# Patient Record
Sex: Male | Born: 1950
Health system: Southern US, Community
[De-identification: ages and names within clinical notes are randomized; demographics above are authoritative.]

## PROBLEM LIST (undated history)

## (undated) DIAGNOSIS — Z9289 Personal history of other medical treatment: Secondary | ICD-10-CM

## (undated) DIAGNOSIS — E559 Vitamin D deficiency, unspecified: Secondary | ICD-10-CM

## (undated) DIAGNOSIS — D649 Anemia, unspecified: Secondary | ICD-10-CM

## (undated) DIAGNOSIS — N529 Male erectile dysfunction, unspecified: Secondary | ICD-10-CM

## (undated) DIAGNOSIS — I480 Paroxysmal atrial fibrillation: Secondary | ICD-10-CM

## (undated) DIAGNOSIS — I517 Cardiomegaly: Secondary | ICD-10-CM

## (undated) DIAGNOSIS — I471 Supraventricular tachycardia, unspecified: Secondary | ICD-10-CM

## (undated) DIAGNOSIS — Z87898 Personal history of other specified conditions: Secondary | ICD-10-CM

## (undated) DIAGNOSIS — G8929 Other chronic pain: Secondary | ICD-10-CM

## (undated) DIAGNOSIS — I4891 Unspecified atrial fibrillation: Principal | ICD-10-CM

## (undated) DIAGNOSIS — I10 Essential (primary) hypertension: Secondary | ICD-10-CM

## (undated) DIAGNOSIS — E785 Hyperlipidemia, unspecified: Secondary | ICD-10-CM

## (undated) DIAGNOSIS — I351 Nonrheumatic aortic (valve) insufficiency: Secondary | ICD-10-CM

## (undated) DIAGNOSIS — M542 Cervicalgia: Principal | ICD-10-CM

## (undated) DIAGNOSIS — E119 Type 2 diabetes mellitus without complications: Secondary | ICD-10-CM

## (undated) HISTORY — DX: Other chronic pain: G89.29

## (undated) HISTORY — DX: Nonrheumatic aortic (valve) insufficiency: I35.1

## (undated) HISTORY — DX: Cervicalgia: M54.2

## (undated) HISTORY — PX: TONSILLECTOMY: SUR1361

## (undated) HISTORY — DX: Paroxysmal atrial fibrillation: I48.0

## (undated) HISTORY — PX: CIRCUMCISION: SUR203

## (undated) HISTORY — DX: Cardiomegaly: I51.7

## (undated) HISTORY — DX: Personal history of other medical treatment: Z92.89

## (undated) HISTORY — PX: WISDOM TOOTH EXTRACTION: SHX21

## (undated) HISTORY — DX: Supraventricular tachycardia: I47.1

## (undated) HISTORY — DX: Hyperlipidemia, unspecified: E78.5

## (undated) HISTORY — DX: Vitamin D deficiency, unspecified: E55.9

## (undated) HISTORY — DX: Supraventricular tachycardia, unspecified: I47.10

---

## 2013-04-17 ENCOUNTER — Encounter (HOSPITAL_COMMUNITY): Payer: Self-pay | Admitting: Emergency Medicine

## 2013-04-17 ENCOUNTER — Inpatient Hospital Stay (HOSPITAL_COMMUNITY)
Admission: EM | Admit: 2013-04-17 | Discharge: 2013-04-19 | DRG: 310 | Disposition: A | Discharge status: Home / Self Care | Payer: Managed Care, Other (non HMO) | Attending: Internal Medicine | Admitting: Internal Medicine

## 2013-04-17 DIAGNOSIS — I4891 Unspecified atrial fibrillation: Principal | ICD-10-CM | POA: Diagnosis present

## 2013-04-17 DIAGNOSIS — E119 Type 2 diabetes mellitus without complications: Secondary | ICD-10-CM | POA: Diagnosis present

## 2013-04-17 DIAGNOSIS — I10 Essential (primary) hypertension: Secondary | ICD-10-CM | POA: Diagnosis present

## 2013-04-17 DIAGNOSIS — Z8249 Family history of ischemic heart disease and other diseases of the circulatory system: Secondary | ICD-10-CM

## 2013-04-17 DIAGNOSIS — Z7982 Long term (current) use of aspirin: Secondary | ICD-10-CM

## 2013-04-17 DIAGNOSIS — D649 Anemia, unspecified: Secondary | ICD-10-CM | POA: Diagnosis present

## 2013-04-17 DIAGNOSIS — Z79899 Other long term (current) drug therapy: Secondary | ICD-10-CM

## 2013-04-17 DIAGNOSIS — Z87898 Personal history of other specified conditions: Secondary | ICD-10-CM

## 2013-04-17 HISTORY — DX: Essential (primary) hypertension: I10

## 2013-04-17 HISTORY — DX: Anemia, unspecified: D64.9

## 2013-04-17 HISTORY — DX: Male erectile dysfunction, unspecified: N52.9

## 2013-04-17 HISTORY — DX: Unspecified atrial fibrillation: I48.91

## 2013-04-17 HISTORY — DX: Personal history of other specified conditions: Z87.898

## 2013-04-17 HISTORY — DX: Type 2 diabetes mellitus without complications: E11.9

## 2013-04-17 LAB — CBC WITH DIFFERENTIAL/PLATELET
Basophils Absolute: 0 10*3/uL (ref 0.0–0.1)
Basophils Relative: 0 % (ref 0–1)
Eosinophils Absolute: 0.2 10*3/uL (ref 0.0–0.7)
Eosinophils Relative: 4 % (ref 0–5)
HCT: 38.7 % — ABNORMAL LOW (ref 39.0–52.0)
Hemoglobin: 13.1 g/dL (ref 13.0–17.0)
Lymphocytes Relative: 39 % (ref 12–46)
Lymphs Abs: 2.5 10*3/uL (ref 0.7–4.0)
MCH: 27.6 pg (ref 26.0–34.0)
MCHC: 33.9 g/dL (ref 30.0–36.0)
MCV: 81.6 fL (ref 78.0–100.0)
Monocytes Absolute: 0.6 10*3/uL (ref 0.1–1.0)
Monocytes Relative: 9 % (ref 3–12)
Neutro Abs: 3 10*3/uL (ref 1.7–7.7)
Neutrophils Relative %: 48 % (ref 43–77)
Platelets: 268 10*3/uL (ref 150–400)
RBC: 4.74 MIL/uL (ref 4.22–5.81)
RDW: 15.4 % (ref 11.5–15.5)
WBC: 6.2 10*3/uL (ref 4.0–10.5)

## 2013-04-17 LAB — BASIC METABOLIC PANEL
BUN: 13 mg/dL (ref 6–23)
CO2: 22 mEq/L (ref 19–32)
Calcium: 9.1 mg/dL (ref 8.4–10.5)
Chloride: 107 mEq/L (ref 96–112)
Creatinine, Ser: 0.89 mg/dL (ref 0.50–1.35)
GFR calc Af Amer: >90 mL/min (ref >90)
GFR calc non Af Amer: 88 mL/min — ABNORMAL LOW (ref >90)
Glucose, Bld: 97 mg/dL (ref 70–99)
Potassium: 4.1 mEq/L (ref 3.7–5.3)
Sodium: 142 mEq/L (ref 137–147)

## 2013-04-17 LAB — TROPONIN I: Troponin I: <0.3 ng/mL (ref <0.30)

## 2013-04-17 LAB — MAGNESIUM: Magnesium: 2.1 mg/dL (ref 1.5–2.5)

## 2013-04-17 MED ORDER — HEPARIN BOLUS VIA INFUSION
4000.0000 [IU] | Freq: Once | INTRAVENOUS | Status: AC
  Administered 2013-04-17: 4000 [IU] via INTRAVENOUS
  Filled 2013-04-17: qty 4000

## 2013-04-17 MED ORDER — ACETAMINOPHEN 650 MG RE SUPP: 650.0000 mg | Freq: Four times a day (QID) | RECTAL | Status: DC | PRN

## 2013-04-17 MED ORDER — ONDANSETRON HCL 4 MG/2ML IJ SOLN: 4.0000 mg | Freq: Four times a day (QID) | INTRAMUSCULAR | Status: DC | PRN

## 2013-04-17 MED ORDER — SODIUM CHLORIDE 0.9 % IV SOLN
INTRAVENOUS | Status: DC
  Administered 2013-04-17: 19:00:00 via INTRAVENOUS

## 2013-04-17 MED ORDER — LOSARTAN POTASSIUM 50 MG PO TABS
100.0000 mg | ORAL_TABLET | Freq: Every day | ORAL | Status: DC
  Administered 2013-04-18: 100 mg via ORAL
  Filled 2013-04-17 (×2): qty 2

## 2013-04-17 MED ORDER — ONDANSETRON HCL 4 MG PO TABS: 4.0000 mg | ORAL_TABLET | Freq: Four times a day (QID) | ORAL | Status: DC | PRN

## 2013-04-17 MED ORDER — DILTIAZEM HCL 100 MG IV SOLR
5.0000 mg/h | Freq: Once | INTRAVENOUS | Status: AC
  Administered 2013-04-17: 5 mg/h via INTRAVENOUS

## 2013-04-17 MED ORDER — SODIUM CHLORIDE 0.9 % IJ SOLN
3.0000 mL | Freq: Two times a day (BID) | INTRAMUSCULAR | Status: DC
Start: 2013-04-17 — End: 2013-04-19

## 2013-04-17 MED ORDER — HEPARIN (PORCINE) IN NACL 100-0.45 UNIT/ML-% IJ SOLN
12.0000 [IU]/kg/h | INTRAMUSCULAR | Status: DC
  Administered 2013-04-17: 12 [IU]/kg/h via INTRAVENOUS
  Filled 2013-04-17 (×2): qty 250

## 2013-04-17 MED ORDER — ASPIRIN EC 81 MG PO TBEC
81.0000 mg | DELAYED_RELEASE_TABLET | Freq: Every day | ORAL | Status: DC
  Administered 2013-04-18 (×2): 81 mg via ORAL
  Filled 2013-04-17 (×3): qty 1

## 2013-04-17 MED ORDER — ACETAMINOPHEN 325 MG PO TABS
650.0000 mg | ORAL_TABLET | Freq: Four times a day (QID) | ORAL | Status: DC | PRN
  Administered 2013-04-18: 650 mg via ORAL
  Filled 2013-04-17: qty 2

## 2013-04-17 MED ORDER — SODIUM CHLORIDE 0.9 % IJ SOLN
3.0000 mL | Freq: Two times a day (BID) | INTRAMUSCULAR | Status: DC
Start: 2013-04-17 — End: 2013-04-19
  Administered 2013-04-18: 3 mL via INTRAVENOUS

## 2013-04-17 NOTE — ED Notes (Signed)
Attempted to give report 

## 2013-04-17 NOTE — ED Provider Notes (Signed)
CSN: [id number]     Arrival date & time 04/17/13  1736 History   First MD Initiated Contact with Patient 04/17/13 1737     Chief Complaint  Patient presents with  . Atrial Fibrillation     (Consider location/radiation/quality/duration/timing/severity/associated sxs/prior Treatment) HPI  63 year old male with palpitations and dizziness. Onset earlier today while walking up some steps on his way to go work out. As evaluated by his PCP just prior to arrival. Noted to be in atrial fibrillation with a rate. Between 6380s to 160. Denies any chest pain or shortness of breath. No nausea or diaphoresis. Patient has had episodes of similar symptoms but have been much briefer in duration. No recent medication changes. No fevers or chills. No significant alcohol intake. No significant caffeine intake. No known history of coronary disease that he is aware of. Never been a smoker.  Past Medical History  Diagnosis Date  . Hypertension   . Erectile dysfunction   . Diabetes mellitus without complication     borderline  . Anemia    Past Surgical History  Procedure Laterality Date  . Tonsillectomy    . Circumcision      at 63yrs old   History reviewed. No pertinent family history. History  Substance Use Topics  . Smoking status: Never Smoker   . Smokeless tobacco: Not on file  . Alcohol Use: No    Review of Systems  All systems reviewed and negative, other than as noted in HPI.   Allergies  Penicillins  Home Medications   Current Outpatient Rx  Name  Route  Sig  Dispense  Refill  . aspirin EC 81 MG tablet   Oral   Take 81 mg by mouth at bedtime.         [name]. losartan (COZAAR) 100 MG tablet   Oral   Take 100 mg by mouth daily.         . psyllium (HYDROCIL/METAMUCIL) 95 % PACK   Oral   Take 1 packet by mouth daily.          BP 109/67  Pulse 106  Temp(Src) 98.2 F (36.8 C) (Oral)  Resp 13  SpO2 99% Physical Exam  Nursing note and vitals reviewed. Constitutional: He  appears well-developed and well-nourished. No distress.  HENT:  Head: Normocephalic and atraumatic.  Eyes: Conjunctivae are normal. Right eye exhibits no discharge. Left eye exhibits no discharge.  Neck: Neck supple.  Cardiovascular: Regular rhythm and normal heart sounds.  Exam reveals no gallop and no friction rub.   No murmur heard. Tachycardic. irreg irreg.   Pulmonary/Chest: Effort normal and breath sounds normal. No respiratory distress.  Abdominal: Soft. He exhibits no distension. There is no tenderness.  Musculoskeletal: He exhibits no edema and no tenderness.  Lower extremities symmetric as compared to each other. No calf tenderness. Negative Homan's. No palpable cords.   Neurological: He is alert.  Skin: Skin is warm and dry.  Psychiatric: He has a normal mood and affect. His behavior is normal. Thought content normal.    ED Course  Procedures (including critical care time) Labs Review Labs Reviewed  CBC WITH DIFFERENTIAL - Abnormal; Notable for the following:    HCT 38.7 (*)    All other components within normal limits  BASIC METABOLIC PANEL - Abnormal; Notable for the following:    GFR calc non Af Amer 88 (*)    All other components within normal limits  MAGNESIUM  TROPONIN I  TSH   Imaging Review No  results found.   EKG:  Rhythm: afib with RVR Vent. rate 133 BPM PR interval * ms QRS duration 93 ms QT/QTc 309/460 ms ST segments: normal   MDM   Final diagnoses:  Atrial fibrillation with rapid ventricular response    64 year old male with new onset atrial fibrillation. May potentially have been going on for longer today given report of symptoms of intermittent dizziness and palpitations going on for at least several years. Rate has been controlled with Cardizem drip, but patient remains in atrial fibrillation. Admit.   Raeford Razor, MD 04/18/13 [phone]

## 2013-04-17 NOTE — Progress Notes (Signed)
ANTICOAGULATION CONSULT NOTE - Initial Consult  Pharmacy Consult for heparin Indication: atrial fibrillation  Allergies  Allergen Reactions  . Penicillins     faint    Patient Measurements: Weight: 243 lb (110.224 kg) (per pt report)   Vital Signs: Temp: 98.2 F (36.8 C) (02/25 1746) Temp src: Oral (02/25 1746) BP: 122/75 mmHg (02/25 2115) Pulse Rate: 87 (02/25 2115)  Labs:  Recent Labs  04/17/13 1810  HGB 13.1  HCT 38.7*  PLT 268  CREATININE 0.89  TROPONINI <0.30    CrCl is unknown because there is no height on file for the current visit.   Medical History: Past Medical History  Diagnosis Date  . Hypertension   . Erectile dysfunction   . Diabetes mellitus without complication     borderline  . Anemia     Medications:  ec asa 81, cozaar 100 qd, metamucil qday  Assessment: 63 yo M to start heparin per pharmacy for Afib. His stated weight is 243 pounds.  Creat 0.80, Hg 13.1, plct 268K.    Goal of Therapy:  Heparin level 0.3-0.7 units/ml Monitor platelets by anticoagulation protocol: Yes   Plan:  Give 4000 units bolus x 1 Start heparin infusion at 1300 units/hr Check anti-Xa level in 8 hours and daily while on heparin Continue to monitor H&H and platelets  Herby AbrahamMichelle T. Tuff Clabo, Pharm.D. [phone] 04/17/2013 9:28 PM

## 2013-04-17 NOTE — ED Notes (Signed)
IV pump malfunction, channel error during pt's bolus. Pharmacy consulted and informed. Initiate a 2,000 unit bolus and continue rate of 3113ml/hr.

## 2013-04-17 NOTE — ED Notes (Signed)
MD at bedside. 

## 2013-04-17 NOTE — ED Notes (Signed)
PER EMS- pt was seen at his PCP for dizziness with standing. Pt was noted to have new onset afib with rate at 80-160. Pt denies chest pain/sob. Pt states that at times he does feel his heart racing. Pt received NS with EMS.

## 2013-04-17 NOTE — ED Notes (Signed)
Pt's BP reading 89/60 rate changed back to 5mg /hr, fluids open wide. Pt A&O X4.

## 2013-04-17 NOTE — ED Notes (Signed)
Admitting MD at bedside.

## 2013-04-17 NOTE — H&P (Signed)
[facility] History and Physical  Andrew Perkins [id number] DOB: 1951-01-21 DOA: 04/17/2013  Referring physician: ER physician. PCP: Provider Not In System  Chief Complaint: Palpitations.  HPI: Andrew DerrickMelvin Cimo is a 63 y.o. male history of hypertension presented to the ER because of persistent palpitations since today afternoon. Patient also has subjective feeling of dizziness but did not have any syncope focal deficits. Patient denies any chest pain or shortness of breath. Since patient's palpitation was persistent patient came to the ER an EKG shows atrial fibrillation with RVR. On-call cardiologist was consulted the ER physician and at this time they have requested hospitalist admission and also suggested to start patient on heparin in the event if patient may need cardioversion. Patient states that he has been getting palpitations off and on for last many years. Presently patient has been started on Cardizem infusion after bolus and has been admitted for further workup. Patient denies any nausea vomiting abdominal pain diarrhea fever chills.   Review of Systems: As presented in the history of presenting illness, rest negative.  Past Medical History  Diagnosis Date  . Hypertension   . Erectile dysfunction   . Diabetes mellitus without complication     borderline  . Anemia    Past Surgical History  Procedure Laterality Date  . Tonsillectomy    . Circumcision      at [age]yrs old   Social History:  reports that he has never smoked. He does not have any smokeless tobacco history on file. He reports that he does not drink alcohol or use illicit drugs. Where does patient live home. Can patient participate in ADLs? Yes.  Allergies  Allergen Reactions  . Penicillins     faint    Family History:  Family History  Problem Relation Age of Onset  . Diabetes Mellitus II Brother   . CAD Neg Hx       Prior to Admission medications   Medication Sig Start Date End Date Taking?  Authorizing Provider  aspirin EC 81 MG tablet Take 81 mg by mouth at bedtime.   Yes Historical Provider, MD  losartan (COZAAR) 100 MG tablet Take 100 mg by mouth daily.   Yes Historical Provider, MD  psyllium (HYDROCIL/METAMUCIL) 95 % PACK Take 1 packet by mouth daily.   Yes Historical Provider, MD    Physical Exam: Filed Vitals:   04/17/13 2130 04/17/13 2145 04/17/13 2200 04/17/13 2215  BP: 113/77 107/74 106/69 116/75  Pulse: 77 83 85 74  Temp:      TempSrc:      Resp: 18 14 14 13   Weight:      SpO2: 98% 99% 97% 99%     General:  Well-developed and nourished.  Eyes: Anicteric no pallor.  ENT: No discharge from the ears eyes nose mouth.  Neck: No mass felt.  Cardiovascular: S1-S2 heard tachycardic.  Respiratory: No rhonchi or crepitations.  Abdomen: Soft nontender bowel sounds present. No guarding or rigidity.  Skin: No rash.  Musculoskeletal: No edema.  Psychiatric: Appears normal.  Neurologic: Alert oriented to time place and person. Moves all extremities.  Labs on Admission:  Basic Metabolic Panel:  Recent Labs Lab 04/17/13 1810  NA 142  K 4.1  CL 107  CO2 22  GLUCOSE 97  BUN 13  CREATININE 0.89  CALCIUM 9.1  MG 2.1   Liver Function Tests: No results found for this basename: AST, ALT, ALKPHOS, BILITOT, PROT, ALBUMIN,  in the last 168 hours No results found for this basename:  LIPASE, AMYLASE,  in the last 168 hours No results found for this basename: AMMONIA,  in the last 168 hours CBC:  Recent Labs Lab 04/17/13 1810  WBC 6.2  NEUTROABS 3.0  HGB 13.1  HCT 38.7*  MCV 81.6  PLT 268   Cardiac Enzymes:  Recent Labs Lab 04/17/13 1810  TROPONINI <0.30    BNP (last 3 results) No results found for this basename: PROBNP,  in the last 8760 hours CBG: No results found for this basename: GLUCAP,  in the last 168 hours  Radiological Exams on Admission: No results found.  EKG: Independently reviewed. A. fib with  RVR.  Assessment/Plan Principal Problem:   Atrial fibrillation with RVR Active Problems:   HTN (hypertension)   1. A. fib with RVR - continue Cardizem infusion. I have added metoprolol 25 mg by mouth twice a day following which Cardizem can be weaned off. Check 2-D echo. Patient's CHADS2 score is only one but at this time cardiologist has recommended to start patient on heparin in the event patient may need cardioversion. Based on patient's course and 2-D echo may decide if patient needs to be on long-term anticoagulants are not. Check thyroid function test for hyperthyroidism and also cardiac markers. 2. Hypertension - continue home medications. 3. Patient states he was diagnosed with prediabetes for which I have ordered hemoglobin A1c and if hemoglobin A1c is more than 6.5 and patient definitely will need to be on anticoagulants for A. fib.    Code Status: Full code.  Family Communication: Family at the bedside.  Disposition Plan: Admit to inpatient.    Humza Tallerico N. [facility] Pager [phone].  If 7PM-7AM, please contact night-coverage [email or web address] Password [facility] 04/17/2013, 10:46 PM

## 2013-04-18 DIAGNOSIS — I369 Nonrheumatic tricuspid valve disorder, unspecified: Secondary | ICD-10-CM

## 2013-04-18 LAB — GLUCOSE, CAPILLARY
Glucose-Capillary: 102 mg/dL — ABNORMAL HIGH (ref 70–99)
Glucose-Capillary: 97 mg/dL (ref 70–99)
Glucose-Capillary: 100 mg/dL — ABNORMAL HIGH (ref 70–99)

## 2013-04-18 LAB — CBC WITH DIFFERENTIAL/PLATELET
Basophils Absolute: 0 10*3/uL (ref 0.0–0.1)
Basophils Relative: 0 % (ref 0–1)
Eosinophils Absolute: 0.3 10*3/uL (ref 0.0–0.7)
Eosinophils Relative: 3 % (ref 0–5)
HCT: 36.9 % — ABNORMAL LOW (ref 39.0–52.0)
Hemoglobin: 12.3 g/dL — ABNORMAL LOW (ref 13.0–17.0)
Lymphocytes Relative: 42 % (ref 12–46)
Lymphs Abs: 3.1 10*3/uL (ref 0.7–4.0)
MCH: 27.4 pg (ref 26.0–34.0)
MCHC: 33.3 g/dL (ref 30.0–36.0)
MCV: 82.2 fL (ref 78.0–100.0)
Monocytes Absolute: 0.7 10*3/uL (ref 0.1–1.0)
Monocytes Relative: 10 % (ref 3–12)
Neutro Abs: 3.3 10*3/uL (ref 1.7–7.7)
Neutrophils Relative %: 45 % (ref 43–77)
Platelets: 223 10*3/uL (ref 150–400)
RBC: 4.49 MIL/uL (ref 4.22–5.81)
RDW: 15.5 % (ref 11.5–15.5)
WBC: 7.4 10*3/uL (ref 4.0–10.5)

## 2013-04-18 LAB — COMPREHENSIVE METABOLIC PANEL
ALT: 12 U/L (ref 0–53)
AST: 15 U/L (ref 0–37)
Albumin: 3.1 g/dL — ABNORMAL LOW (ref 3.5–5.2)
Alkaline Phosphatase: 75 U/L (ref 39–117)
BUN: 13 mg/dL (ref 6–23)
CO2: 21 mEq/L (ref 19–32)
Calcium: 8.5 mg/dL (ref 8.4–10.5)
Chloride: 109 mEq/L (ref 96–112)
Creatinine, Ser: 0.91 mg/dL (ref 0.50–1.35)
GFR calc Af Amer: >90 mL/min (ref >90)
GFR calc non Af Amer: 88 mL/min — ABNORMAL LOW (ref >90)
Glucose, Bld: 98 mg/dL (ref 70–99)
Potassium: 3.6 mEq/L — ABNORMAL LOW (ref 3.7–5.3)
Sodium: 142 mEq/L (ref 137–147)
Total Bilirubin: 0.3 mg/dL (ref 0.3–1.2)
Total Protein: 6.5 g/dL (ref 6.0–8.3)

## 2013-04-18 LAB — HEPARIN LEVEL (UNFRACTIONATED): Heparin Unfractionated: 0.39 IU/mL (ref 0.30–0.70)

## 2013-04-18 LAB — T4, FREE: Free T4: 0.88 ng/dL (ref 0.80–1.80)

## 2013-04-18 LAB — TROPONIN I: Troponin I: <0.3 ng/mL (ref <0.30)

## 2013-04-18 LAB — TSH
TSH: 1.199 u[IU]/mL (ref 0.350–4.500)
TSH: 1.987 u[IU]/mL (ref 0.350–4.500)

## 2013-04-18 LAB — HEMOGLOBIN A1C
Hgb A1c MFr Bld: 5.9 % — ABNORMAL HIGH (ref <5.7)
Mean Plasma Glucose: 123 mg/dL — ABNORMAL HIGH (ref <117)

## 2013-04-18 LAB — T3, FREE: T3, Free: 2.8 pg/mL (ref 2.3–4.2)

## 2013-04-18 MED ORDER — HEPARIN (PORCINE) IN NACL 100-0.45 UNIT/ML-% IJ SOLN
1550.0000 [IU]/h | INTRAMUSCULAR | Status: AC
  Administered 2013-04-18 (×2): 1550 [IU]/h via INTRAVENOUS
  Filled 2013-04-18 (×2): qty 250

## 2013-04-18 MED ORDER — DILTIAZEM HCL 30 MG PO TABS
30.0000 mg | ORAL_TABLET | Freq: Four times a day (QID) | ORAL | Status: DC
  Administered 2013-04-18 – 2013-04-19 (×3): 30 mg via ORAL
  Filled 2013-04-18 (×8): qty 1

## 2013-04-18 MED ORDER — APIXABAN 5 MG PO TABS
5.0000 mg | ORAL_TABLET | Freq: Two times a day (BID) | ORAL | Status: DC
  Administered 2013-04-18 – 2013-04-19 (×3): 5 mg via ORAL
  Filled 2013-04-18 (×5): qty 1

## 2013-04-18 MED ORDER — METOPROLOL TARTRATE 25 MG PO TABS
25.0000 mg | ORAL_TABLET | Freq: Two times a day (BID) | ORAL | Status: DC
  Administered 2013-04-18 (×2): 25 mg via ORAL
  Filled 2013-04-18 (×4): qty 1

## 2013-04-18 MED ORDER — DEXTROSE 5 % IV SOLN
5.0000 mg/h | INTRAVENOUS | Status: DC
  Filled 2013-04-18: qty 100

## 2013-04-18 NOTE — Progress Notes (Signed)
Utilization Review Completed.[name] T2/26/2015  

## 2013-04-18 NOTE — Consult Note (Signed)
CARDIOLOGY CONSULT NOTE       Patient ID: Andrew Perkins MRN: [id number] DOB/AGE: January 24, 1951 63 y.o.  Admit date: 04/17/2013 Referring Physician:  Jomarie Longs Primary Physician: Provider Not In System Primary Cardiologist:  New Reason for Consultation: Afib  Principal Problem:   Atrial fibrillation with RVR Active Problems:   HTN (hypertension)   HPI:   63 yo moved to [place] from [place] in August.  Indicates heavy ETOH prior to that but none since.  Over 3 year history of palpitations.  Had a rough time 3 years ago when mother and  Wife died within 2 weeks of each other.  No documented heart disease.  Has a brother with afib in setting of abnormal thyroid.  History of HTN.  No CAD.  Admitted with increasing palpitations and found to be in  Afib.  Currently rate controlled on cardizem drip and on heparin.  Has insurance and has no bleeding issues.  No history of CVA/TIA.  Not having chest pain syncope or dyspnea.  Has noted poor stamina recently Echo is pending   ROS All other systems reviewed and negative except as noted above  Past Medical History  Diagnosis Date  . Hypertension   . Erectile dysfunction   . Diabetes mellitus without complication     borderline  . Anemia     Family History  Problem Relation Age of Onset  . Diabetes Mellitus II Brother   . CAD Neg Hx     History   Social History  . Marital Status: Single    Spouse Name: N/A    Number of Children: N/A  . Years of Education: N/A   Occupational History  . Not on file.   Social History Main Topics  . Smoking status: Never Smoker   . Smokeless tobacco: Not on file  . Alcohol Use: No  . Drug Use: No  . Sexual Activity: Not on file   Other Topics Concern  . Not on file   Social History Narrative  . No narrative on file    Past Surgical History  Procedure Laterality Date  . Tonsillectomy    . Circumcision      at 63yrs old     . aspirin EC  81 mg Oral QHS  . losartan  100 mg Oral Daily  .  metoprolol tartrate  25 mg Oral BID  . sodium chloride  3 mL Intravenous Q12H  . sodium chloride  3 mL Intravenous Q12H   . diltiazem (CARDIZEM) infusion    . heparin 1,550 Units/hr (04/18/13 0302)    Physical Exam: Blood pressure 105/64, pulse 84, temperature 97.7 F (36.5 C), temperature source Oral, resp. rate 18, height 6\' 3"  (1.905 m), weight 245 lb 9.5 oz (111.4 kg), SpO2 97.00%.   Affect appropriate Overweight black male  HEENT: normal Neck supple with no adenopathy JVP normal no bruits no thyromegaly Lungs clear with no wheezing and good diaphragmatic motion Heart:  S1/S2 no murmur, no rub, gallop or click PMI normal Abdomen: benighn, BS positve, no tenderness, no AAA no bruit.  No HSM or HJR Distal pulses intact with no bruits No edema Neuro non-focal Skin warm and dry No muscular weakness   Labs:   Lab Results  Component Value Date   WBC 7.4 04/18/2013   HGB 12.3* 04/18/2013   HCT 36.9* 04/18/2013   MCV 82.2 04/18/2013   PLT 223 04/18/2013    Recent Labs Lab 04/18/13 0146  NA 142  K 3.6*  CL 109  CO2 21  BUN 13  CREATININE 0.91  CALCIUM 8.5  PROT 6.5  BILITOT 0.3  ALKPHOS 75  ALT 12  AST 15  GLUCOSE 98   Lab Results  Component Value Date   TROPONINI <0.30 04/18/2013     Radiology: No results found.  EKG:  Afib no acute ST/T wave changes    ASSESSMENT AND PLAN:  Afib:  May be related to HTN and previous ETOH abuse.  History suggests some chronicity.  Favor starting novel agent like eliquis.  [facility]  If echo shows LV dysfunction can consider TEE/DCC after two days of eliquis.  Transition to PO cardizem and beta blocker for rate control D/C Heparin after 2nd dose of eliquis Pharmacy to handle  Check TSH/T4 HTN:  Well controlled  ETOH:  Indicates being sober since August    Signed: Charlton Hawseter Tirth Cothron 04/18/2013, 8:57 AM

## 2013-04-18 NOTE — Care Management Note (Unsigned)
    Page 1 of 1   04/18/2013     3:13:46 PM   CARE MANAGEMENT NOTE 04/18/2013  Patient:  Andrew Perkins,Andrew Perkins   Account Number:  [id number]  Date Initiated:  04/18/2013  Documentation initiated by:  Itzae Miralles  Subjective/Objective Assessment:   PT ADM ON 04/17/13 WITH AFIB WITH RVR.  PTA, PT INDEPENDENT OF ADLS.     Action/Plan:   CM REFERRAL FOR ELIQUIS.   Anticipated DC Date:  04/21/2013   Anticipated DC Plan:  HOME/SELF CARE      DC Planning Services  CM consult  Medication Assistance      Choice offered to / List presented to:             Status of service:  In process, will continue to follow Medicare Important Message given?   (If response is "NO", the following Medicare IM given date fields will be blank) Date Medicare IM given:   Date Additional Medicare IM given:    Discharge Disposition:    Per UR Regulation:  Reviewed for med. necessity/level of care/duration of stay  If discussed at Long Length of Stay Meetings, dates discussed:    Comments:  04/18/13 Hiba Garry,RN,BSN [phone] PER INSURANCE CARRIER, COPAY WILL BE $63.00 AT ANY RETAIL PHARMACY, AND DOES NOT REQUIRE PRIOR AUTHORIZATION.  PT GIVEN FREE 30 DAY TRIAL CARD AND COPAY ASSISTANC CARD, WHICH WILL LOWER COPAY TO $10/MONTH FOR A 30 DAY SUPPLY. BOTH CARDS HAVE BEEN ACTIVATED.  HE IS APPRECIATIVE OF HELP.

## 2013-04-18 NOTE — Discharge Instructions (Signed)
Information on my medicine - ELIQUIS® (apixaban) °Why was Eliquis® prescribed for you? °Eliquis® was prescribed for you to reduce the risk of a blood clot forming that can cause a stroke if you have a medical condition called atrial fibrillation (a type of irregular heartbeat). ° °What do You need to know about Eliquis® ? °Take your Eliquis® TWICE DAILY - one tablet in the morning and one tablet in the evening with or without food. If you have difficulty swallowing the tablet whole please discuss with your pharmacist how to take the medication safely. ° °Take Eliquis® exactly as prescribed by your doctor and DO NOT stop taking Eliquis® without talking to the doctor who prescribed the medication.  Stopping may increase your risk of developing a stroke.  Refill your prescription before you run out. ° °After discharge, you should have regular check-up appointments with your healthcare provider that is prescribing your Eliquis®.  In the future your dose may need to be changed if your kidney function or weight changes by a significant amount or as you get older. ° °What do you do if you miss a dose? °If you miss a dose, take it as soon as you remember on the same day and resume taking twice daily.  Do not take more than one dose of ELIQUIS at the same time to make up a missed dose. ° °Important Safety Information °A possible side effect of Eliquis® is bleeding. You should call your healthcare provider right away if you experience any of the following: °  Bleeding from an injury or your nose that does not stop. °  Unusual colored urine (red or dark brown) or unusual colored stools (red or black). °  Unusual bruising for unknown reasons. °  A serious fall or if you hit your head (even if there is no bleeding). ° °Some medicines may interact with Eliquis® and might increase your risk of bleeding or clotting while on Eliquis®. To help avoid this, consult your healthcare provider or pharmacist prior to using any new  prescription or non-prescription medications, including herbals, vitamins, non-steroidal anti-inflammatory drugs (NSAIDs) and supplements. ° °This website has more information on Eliquis® (apixaban): www.Eliquis.com. ° °

## 2013-04-18 NOTE — Progress Notes (Signed)
ANTICOAGULATION CONSULT NOTE  Pharmacy Consult for heparin Indication: atrial fibrillation  Allergies  Allergen Reactions  . Penicillins     faint    Patient Measurements: Height: 6\' 3"  (190.5 cm) Weight: 248 lb 10.9 oz (112.8 kg) IBW/kg (Calculated) : 84.5   Vital Signs: Temp: 97.4 F (36.3 C) (02/25 2330) Temp src: Oral (02/25 2330) BP: 129/83 mmHg (02/25 2330) Pulse Rate: 79 (02/25 2330)  Labs:  Recent Labs  04/17/13 1810 04/18/13 0146  HGB 13.1 12.3*  HCT 38.7* 36.9*  PLT 268 223  HEPARINUNFRC  --  0.39  CREATININE 0.89  --   TROPONINI <0.30  --     Estimated Creatinine Clearance: 113.6 ml/min (by C-G formula based on Cr of 0.89).  Assessment: 63 yo Male with Afib for heparin.  Level drawn only 4 hrs after bolus/infusion started, so expect level to continue to decrease out of goal range.  Goal of Therapy:  Heparin level 0.3-0.7 units/ml Monitor platelets by anticoagulation protocol: Yes   Plan:  Increase heparin 1550 units/hr Check heparin level in 6 hours.  Geannie RisenGreg Cassara Nida, PharmD, BCPS  04/18/2013 2:37 AM

## 2013-04-18 NOTE — Progress Notes (Signed)
Patient's HR up to 160s when up to the bathroom, asymptomatic.  HR returns to 90s when at rest.  Paged physician on-call.  Will continue to monitor.  Andrew Perkins, [name]

## 2013-04-18 NOTE — Progress Notes (Signed)
ANTICOAGULATION CONSULT NOTE - Follow Up Consult  Pharmacy Consult for apixaban Indication: atrial fibrillation  Allergies  Allergen Reactions  . Penicillins     faint    Patient Measurements: Height: 6\' 3"  (190.5 cm) Weight: 245 lb 9.5 oz (111.4 kg) IBW/kg (Calculated) : 84.5  Vital Signs: Temp: 97.7 F (36.5 C) (02/26 0515) Temp src: Oral (02/26 0515) BP: 116/73 mmHg (02/26 1024) Pulse Rate: 103 (02/26 1024)  Labs:  Recent Labs  04/17/13 1810 04/18/13 0146  HGB 13.1 12.3*  HCT 38.7* 36.9*  PLT 268 223  HEPARINUNFRC  --  0.39  CREATININE 0.89 0.91  TROPONINI <0.30 <0.30    Estimated Creatinine Clearance: 110.5 ml/min (by C-G formula based on Cr of 0.91).   Medications:  Apixaban 5 mg po bid  Assessment: 63 year old male with new onset AFib with RVR. Transitioning pt from heparin to apixaban.  CBC is stable, no bleeding is noted.  Rate controlled with diltiazem and lopressor.  Goal of Therapy:  Monitor platelets by anticoagulation protocol: Yes   Plan:  Apixaban 5 mg po bid Stop heparin at 2100 tonight CBC q72h  Agapito GamesAlison Elizandro Laura, PharmD, BCPS Clinical Pharmacist Pager: [phone] 04/18/2013 1:20 PM

## 2013-04-18 NOTE — Progress Notes (Addendum)
[facility] PROGRESS NOTE  Andrew Perkins UJW:[id number] DOB: 1951/02/17 DOA: 04/17/2013 PCP: Provider Not In System  Assessment/Plan:  1. A. fib with RVR  - rate improving - start Po cardizem and wean off Cardizem infusion. - continue metoprolol 25 mg BID - FU Check 2-D echo. Patient's CHADS2 score is only one but at this time, await hbaic and ECHO - Appreciate cards input - being transitioned to Apixaban from IV heparin - FU TSh/T4 normal  2. Hypertension -  - cut down losartan to 50mg .  3. Prediabetes: Fu hemoglobin A1c   Code Status: Full code.  Family Communication: Family at the bedside.  Disposition Plan: Admit to inpatient.    Consultants:  cardiology  HPI/Subjective: Feels better, no complaints  Objective: Filed Vitals:   04/18/13 1024  BP: 116/73  Pulse: 103  Temp:   Resp:     Intake/Output Summary (Last 24 hours) at 04/18/13 1235 Last data filed at 04/18/13 0653  Gross per 24 hour  Intake 127.76 ml  Output    325 ml  Net -197.24 ml   Filed Weights   04/17/13 2100 04/17/13 2330 04/18/13 0515  Weight: 110.224 kg (243 lb) 112.8 kg (248 lb 10.9 oz) 111.4 kg (245 lb 9.5 oz)    Exam:   General:  AAOx3,no distress  Cardiovascular: S1S2/irregular rate and rhythm  Respiratory: CTAB  Abdomen: soft, Nt, BS present  Musculoskeletal: no edema c/c   Data Reviewed: Basic Metabolic Panel:  Recent Labs Lab 04/17/13 1810 04/18/13 0146  NA 142 142  K 4.1 3.6*  CL 107 109  CO2 22 21  GLUCOSE 97 98  BUN 13 13  CREATININE 0.89 0.91  CALCIUM 9.1 8.5  MG 2.1  --    Liver Function Tests:  Recent Labs Lab 04/18/13 0146  AST 15  ALT 12  ALKPHOS 75  BILITOT 0.3  PROT 6.5  ALBUMIN 3.1*   No results found for this basename: LIPASE, AMYLASE,  in the last 168 hours No results found for this basename: AMMONIA,  in the last 168 hours CBC:  Recent Labs Lab 04/17/13 1810 04/18/13 0146  WBC 6.2 7.4  NEUTROABS 3.0 3.3  HGB 13.1 12.3*   HCT 38.7* 36.9*  MCV 81.6 82.2  PLT 268 223   Cardiac Enzymes:  Recent Labs Lab 04/17/13 1810 04/18/13 0146  TROPONINI <0.30 <0.30   BNP (last 3 results) No results found for this basename: PROBNP,  in the last 8760 hours CBG:  Recent Labs Lab 04/18/13 0617 04/18/13 1116  GLUCAP 102* 97    No results found for this or any previous visit (from the past 240 hour(s)).   Studies: No results found.  Scheduled Meds: . apixaban  5 mg Oral BID  . aspirin EC  81 mg Oral QHS  . diltiazem  30 mg Oral 4 times per day  . losartan  100 mg Oral Daily  . metoprolol tartrate  25 mg Oral BID  . sodium chloride  3 mL Intravenous Q12H  . sodium chloride  3 mL Intravenous Q12H   Continuous Infusions: . heparin 1,550 Units/hr (04/18/13 1220)    Principal Problem:   Atrial fibrillation with RVR Active Problems:   HTN (hypertension)    Time spent: 30min    [facility] HospitalJOSEPH,[name]  [facility] Pager [phone]. If 7PM-7AM, please contact night-coverage at [email or web address], password [email or web address] 04/18/2013, 12:35 PM  LOS: 1 day

## 2013-04-18 NOTE — Progress Notes (Signed)
  Echocardiogram 2D Echocardiogram has been performed.  Andrew Perkins 04/18/2013, 11:06 AM

## 2013-04-19 ENCOUNTER — Encounter (HOSPITAL_COMMUNITY): Payer: Self-pay | Admitting: Physician Assistant

## 2013-04-19 DIAGNOSIS — F1021 Alcohol dependence, in remission: Secondary | ICD-10-CM

## 2013-04-19 DIAGNOSIS — Z87898 Personal history of other specified conditions: Secondary | ICD-10-CM

## 2013-04-19 LAB — BASIC METABOLIC PANEL
BUN: 11 mg/dL (ref 6–23)
CO2: 22 mEq/L (ref 19–32)
Calcium: 8.7 mg/dL (ref 8.4–10.5)
Chloride: 106 mEq/L (ref 96–112)
Creatinine, Ser: 0.94 mg/dL (ref 0.50–1.35)
GFR calc Af Amer: >90 mL/min (ref >90)
GFR calc non Af Amer: 86 mL/min — ABNORMAL LOW (ref >90)
Glucose, Bld: 96 mg/dL (ref 70–99)
Potassium: 3.8 mEq/L (ref 3.7–5.3)
Sodium: 139 mEq/L (ref 137–147)

## 2013-04-19 LAB — CBC
HCT: 33.3 % — ABNORMAL LOW (ref 39.0–52.0)
Hemoglobin: 11.2 g/dL — ABNORMAL LOW (ref 13.0–17.0)
MCH: 27.6 pg (ref 26.0–34.0)
MCHC: 33.6 g/dL (ref 30.0–36.0)
MCV: 82 fL (ref 78.0–100.0)
Platelets: 184 10*3/uL (ref 150–400)
RBC: 4.06 MIL/uL — ABNORMAL LOW (ref 4.22–5.81)
RDW: 15.6 % — ABNORMAL HIGH (ref 11.5–15.5)
WBC: 4.8 10*3/uL (ref 4.0–10.5)

## 2013-04-19 MED ORDER — LOSARTAN POTASSIUM 50 MG PO TABS
50.0000 mg | ORAL_TABLET | Freq: Every day | ORAL | Status: DC
  Administered 2013-04-19: 50 mg via ORAL
  Filled 2013-04-19: qty 1

## 2013-04-19 MED ORDER — METOPROLOL TARTRATE 50 MG PO TABS: 50.0000 mg | ORAL_TABLET | Freq: Two times a day (BID) | ORAL | Status: DC

## 2013-04-19 MED ORDER — DILTIAZEM HCL ER COATED BEADS 120 MG PO CP24
120.0000 mg | ORAL_CAPSULE | Freq: Every day | ORAL | Status: DC
  Administered 2013-04-19: 120 mg via ORAL
  Filled 2013-04-19: qty 1

## 2013-04-19 MED ORDER — METOPROLOL TARTRATE 50 MG PO TABS
50.0000 mg | ORAL_TABLET | Freq: Two times a day (BID) | ORAL | Status: DC
  Filled 2013-04-19: qty 1

## 2013-04-19 MED ORDER — APIXABAN 5 MG PO TABS: 5.0000 mg | ORAL_TABLET | Freq: Two times a day (BID) | ORAL | Status: DC

## 2013-04-19 NOTE — Progress Notes (Signed)
[facility] PROGRESS NOTE  Andrew Perkins [id number] DOB: Jun 25, 1950 DOA: 04/17/2013 PCP: Provider Not In System  Assessment/Plan:  1. A. fib with RVR  - rate controlled, converted to NSR yesterday - off Cardizem infusion yesterday, change cardizem Q6 to CD. - continue metoprolol 25 mg BID - 2-D echo with normal EF.  -CHADSVas score is one but started on anticoagulation, continue apixaban - Appreciate cards input - TSh/T4 normal - Needs Cards FU, home today if ok with cards  2. Hypertension - continue home medications.  3. Prediabetes: hemoglobin A1c 5.9  Code Status: Full code.  Family Communication: Family at the bedside.  Disposition Plan: Admit to inpatient.    Consultants:  cardiology  HPI/Subjective: Feels better, no complaints  Objective: Filed Vitals:   04/19/13 0325  BP: 113/61  Pulse: 73  Temp: 98.1 F (36.7 C)  Resp: 16    Intake/Output Summary (Last 24 hours) at 04/19/13 0748 Last data filed at 04/19/13 0335  Gross per 24 hour  Intake    480 ml  Output    780 ml  Net   -300 ml   Filed Weights   04/17/13 2330 04/18/13 0515 04/19/13 0651  Weight: 112.8 kg (248 lb 10.9 oz) 111.4 kg (245 lb 9.5 oz) 111.9 kg (246 lb 11.1 oz)    Exam:   General:  AAOx3,no distress  Cardiovascular: S1S2/irregular rate and rhythm  Respiratory: CTAB  Abdomen: soft, Nt, BS present  Musculoskeletal: no edema c/c   Data Reviewed: Basic Metabolic Panel:  Recent Labs Lab 04/17/13 1810 04/18/13 0146 04/19/13 0500  NA 142 142 139  K 4.1 3.6* 3.8  CL 107 109 106  CO2 22 21 22   GLUCOSE 97 98 96  BUN 13 13 11   CREATININE 0.89 0.91 0.94  CALCIUM 9.1 8.5 8.7  MG 2.1  --   --    Liver Function Tests:  Recent Labs Lab 04/18/13 0146  AST 15  ALT 12  ALKPHOS 75  BILITOT 0.3  PROT 6.5  ALBUMIN 3.1*   No results found for this basename: LIPASE, AMYLASE,  in the last 168 hours No results found for this basename: AMMONIA,  in the last 168  hours CBC:  Recent Labs Lab 04/17/13 1810 04/18/13 0146 04/19/13 0500  WBC 6.2 7.4 4.8  NEUTROABS 3.0 3.3  --   HGB 13.1 12.3* 11.2*  HCT 38.7* 36.9* 33.3*  MCV 81.6 82.2 82.0  PLT 268 223 184   Cardiac Enzymes:  Recent Labs Lab 04/17/13 1810 04/18/13 0146  TROPONINI <0.30 <0.30   BNP (last 3 results) No results found for this basename: PROBNP,  in the last 8760 hours CBG:  Recent Labs Lab 04/18/13 0617 04/18/13 1116 04/18/13 1603  GLUCAP 102* 97 100*    No results found for this or any previous visit (from the past 240 hour(s)).   Studies: No results found.  Scheduled Meds: . apixaban  5 mg Oral BID  . aspirin EC  81 mg Oral QHS  . diltiazem  30 mg Oral 4 times per day  . losartan  50 mg Oral Daily  . metoprolol tartrate  25 mg Oral BID  . sodium chloride  3 mL Intravenous Q12H  . sodium chloride  3 mL Intravenous Q12H   Continuous Infusions:    Principal Problem:   Atrial fibrillation with RVR Active Problems:   HTN (hypertension)    Time spent: 30min    [facility] Medical CenterJOSEPH,Andrew Perkins  [facility] Pager [phone]. If 7PM-7AM, please contact  night-coverage at [email or web address], password [facility] 04/19/2013, 7:48 AM  LOS: 2 days

## 2013-04-19 NOTE — Progress Notes (Signed)
Patient: Andrew Perkins / Admit Date: 04/17/2013 / Date of Encounter: 04/19/2013, 8:20 AM   Subjective  Feels well. No complaints.   Objective   Telemetry: converted to NSR yesterday and maintaining  Physical Exam: Blood pressure 113/61, pulse 73, temperature 98.1 F (36.7 C), temperature source Oral, resp. rate 16, height 6\' 3"  (1.905 m), weight 246 lb 11.1 oz (111.9 kg), SpO2 97.00%. General: Well developed, well nourished AAM in no acute distress Head: Normocephalic, atraumatic, sclera non-icteric, no xanthomas, nares are without discharge. Neck: Negative for carotid bruits. JVP not elevated. Lungs: Clear bilaterally to auscultation without wheezes, rales, or rhonchi. Breathing is unlabored. Heart: RRR S1 S2 without murmurs, rubs, or gallops.  Abdomen: Soft, non-tender, non-distended with normoactive bowel sounds. No rebound/guarding. Extremities: No clubbing or cyanosis. No edema. Distal pedal pulses are 2+ and equal bilaterally. Neuro: Alert and oriented X 3. Moves all extremities spontaneously. Psych:  Responds to questions appropriately with a normal affect.   Intake/Output Summary (Last 24 hours) at 04/19/13 0820 Last data filed at 04/19/13 0335  Gross per 24 hour  Intake    480 ml  Output    780 ml  Net   -300 ml    Inpatient Medications:  . apixaban  5 mg Oral BID  . aspirin EC  81 mg Oral QHS  . diltiazem  120 mg Oral Daily  . losartan  50 mg Oral Daily  . metoprolol tartrate  25 mg Oral BID  . sodium chloride  3 mL Intravenous Q12H  . sodium chloride  3 mL Intravenous Q12H   Infusions:    Labs:  Recent Labs  04/17/13 1810 04/18/13 0146 04/19/13 0500  NA 142 142 139  K 4.1 3.6* 3.8  CL 107 109 106  CO2 22 21 22   GLUCOSE 97 98 96  BUN 13 13 11   CREATININE 0.89 0.91 0.94  CALCIUM 9.1 8.5 8.7  MG 2.1  --   --     Recent Labs  04/18/13 0146  AST 15  ALT 12  ALKPHOS 75  BILITOT 0.3  PROT 6.5  ALBUMIN 3.1*    Recent Labs  04/17/13 1810  04/18/13 0146 04/19/13 0500  WBC 6.2 7.4 4.8  NEUTROABS 3.0 3.3  --   HGB 13.1 12.3* 11.2*  HCT 38.7* 36.9* 33.3*  MCV 81.6 82.2 82.0  PLT 268 223 184    Recent Labs  04/17/13 1810 04/18/13 0146  TROPONINI <0.30 <0.30   No components found with this basename: POCBNP,   Recent Labs  04/18/13 0146  HGBA1C 5.9*     Radiology/Studies: No results found.   Assessment and Plan  1. Newly recognized atrial fibrillation (but 3 yr hx of palpitations), spont coverted to NSR 2. HTN, controlled 3. Prior heavy EtOH abuse, abstinent 4. Anemia - denies bleeding, has h/o mild anemia, instructed to f/u PCP   CHADSVASC score officially 1 for HTN, but patient is turning 63 in 1 month and also has borderline diabetes thus anticoagulation has been recommended. CM has seen for apixaban and he has been given the 30-day free card and copay assist card ($10/mo copay). Primary team will need to give him separate 30 day RX to go along with the free card.  I wonder if maybe we can just put him on once daily diltiazem instead of both dilt and metoprolol. He is new to both these meds. (He is now on dilt 120 and metoprolol 25 BID. Has normal LV function.) Will d/w MD. Likely  OK for discharge once this decision is made.  Left message on our scheduling voicemail for 1) followup appt and 2) education/check-in visit in our blood thinner clinic with our pharmacist. Office will call him with these appts.  Signed, Ronie Spiesayna Dunn PA-C

## 2013-04-19 NOTE — Progress Notes (Signed)
IV and tele monitor d/c at this time; pt given d/c instructions and prescriptions; pt verbalized understanding; pt to d/c home with family; pt wanting to eat lunch then will call for ride home.

## 2013-04-19 NOTE — Progress Notes (Signed)
Per cardiology pt ready for d/c home; Dr. Jomarie LongsJoseph paged to make aware.

## 2013-04-19 NOTE — Discharge Summary (Signed)
Physician Discharge Summary  Hosea Hanawalt ZOX:[id number] DOB: 1950/04/21 DOA: 04/17/2013  PCP: PROVIDER NOT IN SYSTEM  Admit date: 04/17/2013 Discharge date: 04/19/2013  Time spent:  Recommendations for Outpatient Follow-up:  1. Dr.Nishan in 1 week, office to call  Discharge Diagnoses:  Principal Problem:   Atrial fibrillation with RVR Active Problems:   HTN (hypertension)   Former consumption of alcohol   Anemia   Discharge Condition: stable  Diet recommendation: low sodium  Filed Weights   04/17/13 2330 04/18/13 0515 04/19/13 0651  Weight: 112.8 kg (248 lb 10.9 oz) 111.4 kg (245 lb 9.5 oz) 111.9 kg (246 lb 11.1 oz)    History of present illness:  Andrew Perkins is a 63 y.o. male history of hypertension presented to the ER because of persistent palpitations since today afternoon. Patient also has subjective feeling of dizziness but did not have any syncope focal deficits. Patient denies any chest pain or shortness of breath. Since patient's palpitation was persistent patient came to the ER an EKG shows atrial fibrillation with RVR. On-call cardiologist was consulted the ER physician and at this time they have requested hospitalist admission and also suggested to start patient on heparin in the event if patient may need cardioversion. Patient states that he has been getting palpitations off and on for last many years. Presently patient has been started on Cardizem infusion after bolus and has been admitted for further workup   Hospital Course:  1. Afib with RVR  - was started on Cardizem infusion on admission, this was stopped yesterday am - started on Metoprolol and Po cardizem, at discharge cardiology recommended higher dose metoprolol of 50mg  BID and no cardizem  -yesterday evening converted back to sinus rhythm - 2-D echo with normal EF.  -CHADSVas score is one, started on anticoagulation per cardiology recomendations, continue apixaban  - TSh/T4 normal  - he will Fu  with Dr.Nishan   2. Hypertension - continue home medications.  3. Prediabetes: hemoglobin A1c 5.9  4.   Mild normocytic anemia      - further workup as outpatient   Procedures: ECHO Study Conclusions: - Left ventricle: The cavity size was normal. Wall thickness was increased in a pattern of mild LVH. Systolic function was normal. The estimated ejection fraction was in the range of 60% to 65%. Wall motion was normal; there were no regional wall motion abnormalities. - Right atrium: The atrium was mildly dilated.    Consultations:  CArdiology  Discharge Exam: Filed Vitals:   04/19/13 1113  BP: 146/82  Pulse: 76  Temp:   Resp:     General: AAOx3 Cardiovascular: S1S2/RRR Respiratory: CTAB  Discharge Instructions  Discharge Orders   Future Orders Complete By Expires   Diet - low sodium heart healthy  As directed    Increase activity slowly  As directed        Medication List    STOP taking these medications       aspirin EC 81 MG tablet      TAKE these medications       apixaban 5 MG Tabs tablet  Commonly known as:  ELIQUIS  Take 1 tablet (5 mg total) by mouth 2 (two) times daily.     losartan 100 MG tablet  Commonly known as:  COZAAR  Take 100 mg by mouth daily.     metoprolol 50 MG tablet  Commonly known as:  LOPRESSOR  Take 1 tablet (50 mg total) by mouth 2 (two) times daily.  psyllium 95 % Pack  Commonly known as:  HYDROCIL/METAMUCIL  Take 1 packet by mouth daily.       Allergies  Allergen Reactions  . Penicillins     faint       Follow-up Information   Follow up with Primary Care Provider. [facility](Labwork showed that you were mildly anemic. Follow up with your primary doctor for this.)       Follow up with Charlton HawsPeter Nishan, MD. (Our office will call you for a follow-up appointment. Please call the office if you have not heard from us within 3 days.)    Specialty:  Cardiology   Contact information:   [address] [place] [address] [phone]        The results of significant diagnostics from this hospitalization (including imaging, microbiology, ancillary and laboratory) are listed below for reference.    Significant Diagnostic Studies: No results found.  Microbiology: No results found for this or any previous visit (from the past 240 hour(s)).   Labs: Basic Metabolic Panel:  Recent Labs Lab 04/17/13 1810 04/18/13 0146 04/19/13 0500  NA 142 142 139  K 4.1 3.6* 3.8  CL 107 109 106  CO2 22 21 22   GLUCOSE 97 98 96  BUN 13 13 11   CREATININE 0.89 0.91 0.94  CALCIUM 9.1 8.5 8.7  MG 2.1  --   --    Liver Function Tests:  Recent Labs Lab 04/18/13 0146  AST 15  ALT 12  ALKPHOS 75  BILITOT 0.3  PROT 6.5  ALBUMIN 3.1*   No results found for this basename: LIPASE, AMYLASE,  in the last 168 hours No results found for this basename: AMMONIA,  in the last 168 hours CBC:  Recent Labs Lab 04/17/13 1810 04/18/13 0146 04/19/13 0500  WBC 6.2 7.4 4.8  NEUTROABS 3.0 3.3  --   HGB 13.1 12.3* 11.2*  HCT 38.7* 36.9* 33.3*  MCV 81.6 82.2 82.0  PLT 268 223 184   Cardiac Enzymes:  Recent Labs Lab 04/17/13 1810 04/18/13 0146  TROPONINI <0.30 <0.30   BNP: BNP (last 3 results) No results found for this basename: PROBNP,  in the last 8760 hours CBG:  Recent Labs Lab 04/18/13 0617 04/18/13 1116 04/18/13 1603  GLUCAP 102* 97 100*       Signed:  Cherity Blickenstaff  [facility] 04/19/2013, 11:50 AM

## 2013-04-19 NOTE — Progress Notes (Signed)
ECHO:  Study Conclusions  - Left ventricle: The cavity size was normal. Wall thickness was increased in a pattern of mild LVH. Systolic function was normal. The estimated ejection fraction was in the range of 60% to 65%. Wall motion was normal; there were no regional wall motion abnormalities. - Right atrium: The atrium was mildly dilated. - Pulmonary arteries: PA peak pressure: 33mm Hg (S).   Ready for discharge on  Metoprolol 50 mg BID., Losartan, and Eliquis (no aspirin)

## 2013-04-29 ENCOUNTER — Encounter: Payer: Self-pay | Admitting: Cardiovascular Disease

## 2013-04-30 ENCOUNTER — Ambulatory Visit (INDEPENDENT_AMBULATORY_CARE_PROVIDER_SITE_OTHER): Payer: Managed Care, Other (non HMO) | Admitting: Cardiovascular Disease

## 2013-04-30 ENCOUNTER — Encounter: Payer: Self-pay | Admitting: Cardiovascular Disease

## 2013-04-30 VITALS — BP 124/68 | HR 60 | Ht 72.0 in | Wt 247.0 lb

## 2013-04-30 DIAGNOSIS — I10 Essential (primary) hypertension: Secondary | ICD-10-CM

## 2013-04-30 DIAGNOSIS — R079 Chest pain, unspecified: Secondary | ICD-10-CM | POA: Insufficient documentation

## 2013-04-30 DIAGNOSIS — I4891 Unspecified atrial fibrillation: Secondary | ICD-10-CM

## 2013-04-30 NOTE — Assessment & Plan Note (Signed)
Maint NSR  Continue eliquis and beta blocker

## 2013-04-30 NOTE — Assessment & Plan Note (Signed)
Atypical but r/o CAD important in regard to future antiarrhythmic Rx  ECG with nonspecific lateral T wave changes so needs imaging F/U stress myovue

## 2013-04-30 NOTE — Patient Instructions (Signed)
Your physician wants you to follow-up in:   6  MONTHS WITH DR NISHAN  You will receive a reminder letter in the mail two months in advance. If you don't receive a letter, please call our office to schedule the follow-up appointment. Your physician recommends that you continue on your current medications as directed. Please refer to the Current Medication list given to you today. Your physician has requested that you have en exercise stress myoview. For further information please visit [email or web address]. Please follow instruction sheet, as given.  

## 2013-04-30 NOTE — Progress Notes (Signed)
Patient ID: Andrew Perkins, Andrew Perkins   DOB: [birth year], 63 y.o.   MRN: [id number] 63 yo moved [place] from [place] in August. Indicates heavy ETOH prior to that but none since. Over 3 year history of palpitations. Had a rough time 3 years ago when mother and Wife died within 2 weeks of each other. No documented heart disease. Has a brother with afib in setting of abnormal thyroid. History of HTN. No CAD. Admitted with increasing palpitations and found to be in Afib at end of February . Converted with cardizem and started on eliquis   Echo 2/15 Study Conclusions  - Left ventricle: The cavity size was normal. Wall thickness was increased in a pattern of mild LVH. Systolic function was normal. The estimated ejection fraction was in the range of 60% to 65%. Wall motion was normal; there were no regional wall motion abnormalities. - Right atrium: The atrium was mildly dilated. - Pulmonary arteries: PA peak pressure: 33mm Hg (S).  Getting some atypical chest pains Thinks they are related to skips in heart  Has had them 3-4 times since d/c No stress test done in hospital 2 episodes were with exertion No rest tightness      ROS: Denies fever, malais, weight loss, blurry vision, decreased visual acuity, cough, sputum, SOB, hemoptysis, pleuritic pain, palpitaitons, heartburn, abdominal pain, melena, lower extremity edema, claudication, or rash.  All other systems reviewed and negative  General: Affect appropriate Healthy:  appears stated age HEENT: normal Neck supple with no adenopathy JVP normal no bruits no thyromegaly Lungs clear with no wheezing and good diaphragmatic motion Heart:  S1/S2 no murmur, no rub, gallop or click PMI normal Abdomen: benighn, BS positve, no tenderness, no AAA no bruit.  No HSM or HJR Distal pulses intact with no bruits No edema Neuro non-focal Skin warm and dry No muscular weakness   Current Outpatient Prescriptions  Medication Sig Dispense Refill  . apixaban  (ELIQUIS) 5 MG TABS tablet Take 1 tablet (5 mg total) by mouth 2 (two) times daily.  60 tablet  0  . losartan (COZAAR) 100 MG tablet Take 100 mg by mouth daily.      . metoprolol (LOPRESSOR) 50 MG tablet Take 1 tablet (50 mg total) by mouth 2 (two) times daily.  60 tablet  0  . psyllium (HYDROCIL/METAMUCIL) 95 % PACK Take 1 packet by mouth daily.       No current facility-administered medications for this visit.    Allergies  Penicillins  Electrocardiogram:  afib rate 133 nonspecific ST/T wave changes   Assessment and Plan

## 2013-04-30 NOTE — Assessment & Plan Note (Signed)
Well controlled.  Continue current medications and low sodium Dash type diet.    

## 2013-05-02 ENCOUNTER — Telehealth: Payer: Self-pay | Admitting: Cardiovascular Disease

## 2013-05-02 NOTE — Telephone Encounter (Signed)
Returned call to patient he stated he had a episode of atrial fib this afternoon that lasted appox 10 min.Stated he called 911 and when EMS got to his house,heart started beating normal.Stated he feels fine now.Stated he wanted to let Dr.Nishan know and did he need to change medication.Advised to continue same medication.Message sent to Dr.Nishan for review.

## 2013-05-02 NOTE — Telephone Encounter (Signed)
New message    patient called EMS today. C/O Afib . Back to normal rhythm . Advise by EMS to call his cardiologists.    Patient stated he fine at this moment.

## 2013-05-02 NOTE — Telephone Encounter (Signed)
Continue beta blocker and eliquis for now  He is supposed to have stress myovue soon.  Need this result before  Thinking about adding antiarrhythmic.

## 2013-05-03 NOTE — Telephone Encounter (Signed)
Returned call to patient Dr.Nishan advised to continue same medications.Advised to keep myoview appointment 05/15/13.Advised to call back sooner if needed.Dr.Nishan thinking about adding antiarrhythmic.

## 2013-05-06 ENCOUNTER — Encounter: Payer: Self-pay | Admitting: Podiatry

## 2013-05-06 ENCOUNTER — Ambulatory Visit (INDEPENDENT_AMBULATORY_CARE_PROVIDER_SITE_OTHER): Payer: Managed Care, Other (non HMO) | Admitting: Podiatry

## 2013-05-06 VITALS — BP 138/82 | HR 53 | Resp 18

## 2013-05-06 DIAGNOSIS — M2040 Other hammer toe(s) (acquired), unspecified foot: Secondary | ICD-10-CM

## 2013-05-06 NOTE — Patient Instructions (Signed)
Hammer Toes Hammer toes is a condition in which one or more of your toes is permanently flexed. CAUSES  This happens when a muscle imbalance or abnormal bone length makes your small toes buckle. This causes the toe joint to contract and the strong cord-like bands that attach muscles to the bones (tendons) in your toes to shorten.  SIGNS AND SYMPTOMS  Common symptoms of flexible hammer toes include:   A build up of skin cells (Corns). Corns occur where boney bumps come in frequent contact with hard surfaces. For example, where your shoes press and rub.  Irritation.  Inflammation.  Pain.  Limited motion in your toes DIAGNOSIS  Hammer toes are diagnosed through a physical exam of your toes.During the exam, your health care provider may try to reproduce your symptoms by manipulating your foot. Often, x-ray exams are done to determine the degree of deformity and to make sure that the cause is not a fracture.  TREATMENT  Hammer toes can be treated with corrective surgery. There are several types of surgical procedures that can treat hammer toes. The most common procedures include:  Arthroplasty A portion of the joint is surgically removed and your toe is straightened. The gap fills in with fibrous tissue. This procedure helps treat pain and deformity and helps restore function.  Fusion Cartilage between the two bones of the affected joint is taken out and the bones fuse together into one longer bone. This helps keep your toe stable and reduces pain but leaves your toe stiff, yet straight.  Implantation A portion of your bone is removed and replaced with an implant to restore motion.  Flexor tendon transfers This procedure repositions the tendons that curl the toes down (flexor tendons). This may be done to release the deforming force that causes your toe to buckle. Several of these procedures require fixing your toe with a pin that is visible at the tip of your toe. The pin keeps the toe  straight during healing. Your health care provider will remove the pin usually within 4 8 weeks after the procedure.  Document Released: 02/05/2000 Document Revised: 11/28/2012 Document Reviewed: 10/15/2012 [employer] Patient Information 2014 [employer].  

## 2013-05-06 NOTE — Progress Notes (Signed)
   Subjective:    Patient ID: Andrew Perkins, male    DOB: 1950-06-07, 63 y.o.   MRN: [id number]  HPI I have some fungus on 2nd toes on both feet and has been going on for about 2 years and they are discolored and they do not hurt and I have a corn on my 2nd toe on my right foot and been going on for about 3 months and hurts with shoes and did have something on top of it come off and does hurt with shoes.    Review of Systems  Cardiovascular: Positive for palpitations.  Musculoskeletal:       Joint pain  All other systems reviewed and are negative.       Objective:   Physical Exam Orientated x3 black male  Vascular: DP and PT pulses are 2/4 bilaterally.   neurological: Knee and ankle reflexes equal and reactive bilaterally.  Dermatological: Keratoses noted second right toe. The distal second toes bilaterally have some texture and color changes.  Musculoskeletal HAV deformity noted bilaterally. Hammertoe second right with associated hyperkeratotic lesion. There is no restriction ankle, subtalar joint, metatarsal phalangeal joints bilaterally.       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 At this time am recommending shoe modification and protective padding with a foam non medicated pad applied as needed. He is advised symptoms worsened over time to return for further evaluation.  I am not recommending any active treatment for the second toenails as I believe there secondary to microtrauma. I recommended that he follow the nails down as needed.  Reappoint at patient's request

## 2013-05-15 ENCOUNTER — Ambulatory Visit (HOSPITAL_COMMUNITY): Payer: Managed Care, Other (non HMO) | Attending: Cardiovascular Disease | Admitting: Radiology

## 2013-05-15 VITALS — BP 117/77 | Ht 72.0 in | Wt 248.0 lb

## 2013-05-15 DIAGNOSIS — I4891 Unspecified atrial fibrillation: Secondary | ICD-10-CM | POA: Insufficient documentation

## 2013-05-15 DIAGNOSIS — I4949 Other premature depolarization: Secondary | ICD-10-CM

## 2013-05-15 DIAGNOSIS — R079 Chest pain, unspecified: Secondary | ICD-10-CM | POA: Insufficient documentation

## 2013-05-15 DIAGNOSIS — R002 Palpitations: Secondary | ICD-10-CM | POA: Insufficient documentation

## 2013-05-15 MED ORDER — TECHNETIUM TC 99M SESTAMIBI GENERIC - CARDIOLITE
10.0000 | Freq: Once | INTRAVENOUS | Status: AC | PRN
  Administered 2013-05-15: 10 via INTRAVENOUS

## 2013-05-15 MED ORDER — TECHNETIUM TC 99M SESTAMIBI GENERIC - CARDIOLITE
30.0000 | Freq: Once | INTRAVENOUS | Status: AC | PRN
  Administered 2013-05-15: 30 via INTRAVENOUS

## 2013-05-15 NOTE — Progress Notes (Signed)
[facility] SITE 3 NUCLEAR MED [address] [id number] [phone]    Cardiology Nuclear Med Study  Lenor DerrickMelvin Stadler is a 63 y.o. male     MRN : [id number]     DOB: 17-Nov-1950  Procedure Date: 05/15/2013  Nuclear Med Background Indication for Stress Test:  Evaluation for Ischemia, Abnormal EKG and 2/15 ER Afib History:  AFIB, 04/18/13 EF: 60-65% Cardiac Risk Factors: Hypertension  Symptoms:  Chest Pain and Palpitations   Nuclear Pre-Procedure Caffeine/Decaff Intake:  None NPO After: 8:00pm   Lungs:  clear O2 Sat: 98% on room air. IV 0.9% NS with Angio Cath:  22g  IV Site: R Hand  IV Started by:  Bonnita LevanJackie Smith, RN  Chest Size (in):  48 Cup Size: n/a  Height: 6' (1.829 m)  Weight:  248 lb (112.492 kg)  BMI:  Body mass index is 33.63 kg/(m^2). Tech Comments:  N/A    Nuclear Med Study 1 or 2 day study: 1 day  Stress Test Type:  Stress  Reading MD: N/A  Order Authorizing Provider:  Charlton HawsPeter Nishan, MD  Resting Radionuclide: Technetium 3545m Sestamibi  Resting Radionuclide Dose: 11.0 mCi   Stress Radionuclide:  Technetium 3345m Sestamibi  Stress Radionuclide Dose: 33.0 mCi           Stress Protocol Rest HR: 50 Stress HR: 136  Rest BP:117/77 Stress BP: 165/83  Exercise Time (min): 10:15 METS: 12.10   Predicted Max HR: 156 bpm % Max HR: 87.18 bpm Rate Pressure Product: [id number]   Dose of Adenosine (mg):  n/a Dose of Lexiscan: n/a mg  Dose of Atropine (mg): n/a Dose of Dobutamine: n/a mcg/kg/min (at max HR)  Stress Test Technologist: Milana NaSabrina Williams, EMT-P  Nuclear Technologist:  Domenic PoliteStephen Carbone, CNMT     Rest Procedure:  Myocardial perfusion imaging was performed at rest 45 minutes following the intravenous administration of Technetium 7345m Sestamibi. Rest ECG: NSR - Normal EKG  Stress Procedure:  The patient exercised on the treadmill utilizing the Bruce Protocol for 10:15 minutes. The patient stopped due to fatigue, doe and denied any chest pain.   Technetium 4545m Sestamibi was injected at peak exercise and myocardial perfusion imaging was performed after a brief delay. Stress ECG: There are scattered PVCs.  QPS Raw Data Images:  Normal; no motion artifact; normal heart/lung ratio. Stress Images:  There is decreased uptake in the inferior wall. Rest Images:  There is decreased uptake in the inferior wall. Subtraction (SDS):  There is a fixed inferior defect that is most consistent with diaphragmatic attenuation. Transient Ischemic Dilatation (Normal <1.22):  0.82 Lung/Heart Ratio (Normal <0.45):  0.32  Quantitative Gated Spect Images QGS EDV:  124 ml QGS ESV:  53 ml  Impression Exercise Capacity:  Excellent exercise capacity. BP Response:  Normal blood pressure response. Clinical Symptoms:  No significant symptoms noted. ECG Impression:  There are scattered PVCs. Comparison with Prior Nuclear Study: No previous nuclear study performed  Overall Impression:  Low risk stress nuclear study with fixed inferior bowel artifact. No ischemia. Excellent exercise effort. DTS of +5.  LV Ejection Fraction: 58%.  LV Wall Motion:  Normal Wall Motion  Chrystie NoseKenneth C. Hilty, MD, Healthpark Medical CenterFACC Board Certified in Nuclear Cardiology Attending Cardiologist [facility]

## 2013-05-17 ENCOUNTER — Other Ambulatory Visit: Payer: Self-pay | Admitting: *Deleted

## 2013-05-17 MED ORDER — APIXABAN 5 MG PO TABS: 5.0000 mg | ORAL_TABLET | Freq: Two times a day (BID) | ORAL | Status: DC

## 2013-07-01 ENCOUNTER — Telehealth: Payer: Self-pay | Admitting: Cardiovascular Disease

## 2013-07-01 NOTE — Telephone Encounter (Signed)
He is on beta blocker and eliquis so ok  If he has more episodes would start multaq or flecainide  Have him see me next available

## 2013-07-01 NOTE — Telephone Encounter (Signed)
SPOKE WITH PT IN  GREAT LENGTH   HAD  A FIB  EPISODE YESTERDAY  THAT LASTED TO  10 -15 MIN  FIRST  EPISODE IN   2 MONTHS   FEELS  FINE   AT THIS TIME  INFORMED  PT NOTHING  TO DO AT THIS  TIME   BUT  TO  CONTINUE  TO MONITOR   IF  EPISODES   BECOME MORE  FREQUENT   LAST  LONGER AND  ARE  SYMPTOMATIC  TO CALL OFFICE   BACK OTHERWISE  WOULD  SEE PT  AS  SCHEDULED  IN SEPT WILL FORWARD TO DR Eden EmmsNISHAN FOR  REVIEW./CY

## 2013-07-01 NOTE — Telephone Encounter (Signed)
New message     Pt was in AFIB yesterday

## 2013-07-02 ENCOUNTER — Telehealth: Payer: Self-pay | Admitting: *Deleted

## 2013-07-02 NOTE — Telephone Encounter (Signed)
[employer] approved eliquis through 07/02/2014, [place] [id number]

## 2013-07-02 NOTE — Telephone Encounter (Signed)
PT  NOTIFIED,  APPT  MADE  FOR  07-29-13  AT  8:30 AM

## 2013-07-02 NOTE — Telephone Encounter (Signed)
PA to [employer] for pts eliquis

## 2013-07-08 ENCOUNTER — Other Ambulatory Visit: Payer: Self-pay | Admitting: *Deleted

## 2013-07-08 MED ORDER — APIXABAN 5 MG PO TABS: 5.0000 mg | ORAL_TABLET | Freq: Two times a day (BID) | ORAL | Status: DC

## 2013-07-29 ENCOUNTER — Ambulatory Visit
Admission: RE | Admit: 2013-07-29 | Discharge: 2013-07-29 | Disposition: A | Discharge status: Home / Self Care | Payer: Medicare Other | Source: Ambulatory Visit | Attending: Family Medicine | Admitting: Family Medicine

## 2013-07-29 ENCOUNTER — Encounter: Payer: Self-pay | Admitting: Cardiovascular Disease

## 2013-07-29 ENCOUNTER — Ambulatory Visit (INDEPENDENT_AMBULATORY_CARE_PROVIDER_SITE_OTHER): Payer: Medicare Other | Admitting: Cardiovascular Disease

## 2013-07-29 ENCOUNTER — Other Ambulatory Visit: Payer: Self-pay | Admitting: Family Medicine

## 2013-07-29 VITALS — BP 132/86 | HR 67 | Ht 72.0 in | Wt 247.0 lb

## 2013-07-29 DIAGNOSIS — I10 Essential (primary) hypertension: Secondary | ICD-10-CM

## 2013-07-29 DIAGNOSIS — I4891 Unspecified atrial fibrillation: Secondary | ICD-10-CM

## 2013-07-29 DIAGNOSIS — R609 Edema, unspecified: Secondary | ICD-10-CM

## 2013-07-29 DIAGNOSIS — R079 Chest pain, unspecified: Secondary | ICD-10-CM

## 2013-07-29 DIAGNOSIS — R52 Pain, unspecified: Secondary | ICD-10-CM

## 2013-07-29 NOTE — Progress Notes (Signed)
Patient ID: Andrew Perkins, male   DOB: Dec 04, 1950, 63 y.o.   MRN: [id number] 63 yo moved to [place] from [place] in August. Indicates heavy ETOH prior to that but none since. Over 3 year history of palpitations. Had a rough time 3 years ago when mother and Wife died within 2 weeks of each other. No documented heart disease. Has a brother with afib in setting of abnormal thyroid. History of HTN. No CAD. Admitted with increasing palpitations and found to be in Afib at end of February . Converted with cardizem and started on eliquis  Echo 2/15  Study Conclusions  - Left ventricle: The cavity size was normal. Wall thickness was increased in a pattern of mild LVH. Systolic function was normal. The estimated ejection fraction was in the range of 60% to 65%. Wall motion was normal; there were no regional wall motion abnormalities. - Right atrium: The atrium was mildly dilated. - Pulmonary arteries: PA peak pressure: 65mm Hg (S).  Getting some atypical chest pains Thinks they are related to skips in heart Has had them 3-4 times since d/c No stress test done in hospital  2 episodes were with exertion No rest tightness   05/16/13  Myovue normal just bowel artifact EF 58%  Still anxious about his heart  Feels palpitations from time to time     ROS: Denies fever, malais, weight loss, blurry vision, decreased visual acuity, cough, sputum, SOB, hemoptysis, pleuritic pain, palpitaitons, heartburn, abdominal pain, melena, lower extremity edema, claudication, or rash.  All other systems reviewed and negative  General: Affect appropriate Healthy:  appears stated age HEENT: normal Neck supple with no adenopathy JVP normal no bruits no thyromegaly Lungs clear with no wheezing and good diaphragmatic motion Heart:  S1/S2 no murmur, no rub, gallop or click PMI normal Abdomen: benighn, BS positve, no tenderness, no AAA no bruit.  No HSM or HJR Distal pulses intact with no bruits No edema Neuro  non-focal Skin warm and dry No muscular weakness   Current Outpatient Prescriptions  Medication Sig Dispense Refill  . apixaban (ELIQUIS) 5 MG TABS tablet Take 1 tablet (5 mg total) by mouth 2 (two) times daily.  180 tablet  0  . ferrous fumarate (HEMOCYTE - 106 MG FE) 325 (106 FE) MG TABS tablet Take 1 tablet by mouth daily.      [name] losartan (COZAAR) 100 MG tablet Take 100 mg by mouth daily.      . metoprolol (LOPRESSOR) 50 MG tablet Take 1 tablet (50 mg total) by mouth 2 (two) times daily.  60 tablet  0  . psyllium (HYDROCIL/METAMUCIL) 95 % PACK Take 1 packet by mouth daily.       No current facility-administered medications for this visit.    Allergies  Penicillins  Electrocardiogram:  SR rate 67 normal   Assessment and Plan

## 2013-07-29 NOTE — Assessment & Plan Note (Signed)
Maint NSR continue asa and beta blocker

## 2013-07-29 NOTE — Patient Instructions (Signed)
Your physician wants you to follow-up in:  6 MONTHS WITH DR NISHAN  You will receive a reminder letter in the mail two months in advance. If you don't receive a letter, please call our office to schedule the follow-up appointment. Your physician recommends that you continue on your current medications as directed. Please refer to the Current Medication list given to you today. 

## 2013-07-29 NOTE — Assessment & Plan Note (Signed)
Atypical Myovue with no ischemia and normal EF  observe

## 2013-07-29 NOTE — Assessment & Plan Note (Signed)
Well controlled.  Continue current medications and low sodium Dash type diet.    

## 2013-07-31 ENCOUNTER — Other Ambulatory Visit: Payer: Self-pay | Admitting: Family Medicine

## 2013-07-31 DIAGNOSIS — R1011 Right upper quadrant pain: Secondary | ICD-10-CM

## 2013-08-06 ENCOUNTER — Ambulatory Visit
Admission: RE | Admit: 2013-08-06 | Discharge: 2013-08-06 | Disposition: A | Discharge status: Home / Self Care | Payer: Medicare Other | Source: Ambulatory Visit | Attending: Family Medicine | Admitting: Family Medicine

## 2013-08-06 DIAGNOSIS — R1011 Right upper quadrant pain: Secondary | ICD-10-CM

## 2013-08-07 ENCOUNTER — Ambulatory Visit: Payer: Medicare Other | Attending: Family Medicine

## 2013-08-07 DIAGNOSIS — IMO0001 Reserved for inherently not codable concepts without codable children: Secondary | ICD-10-CM | POA: Diagnosis present

## 2013-08-07 DIAGNOSIS — M25569 Pain in unspecified knee: Secondary | ICD-10-CM | POA: Insufficient documentation

## 2013-08-07 DIAGNOSIS — R5381 Other malaise: Secondary | ICD-10-CM | POA: Insufficient documentation

## 2013-08-14 ENCOUNTER — Ambulatory Visit: Payer: Medicare Other | Admitting: Physical Therapy

## 2013-08-14 DIAGNOSIS — IMO0001 Reserved for inherently not codable concepts without codable children: Secondary | ICD-10-CM | POA: Diagnosis not present

## 2013-08-16 ENCOUNTER — Ambulatory Visit: Payer: Medicare Other | Admitting: Physical Therapy

## 2013-08-16 DIAGNOSIS — IMO0001 Reserved for inherently not codable concepts without codable children: Secondary | ICD-10-CM | POA: Diagnosis not present

## 2013-08-19 ENCOUNTER — Ambulatory Visit: Payer: Medicare Other | Admitting: Physical Therapy

## 2013-08-19 DIAGNOSIS — IMO0001 Reserved for inherently not codable concepts without codable children: Secondary | ICD-10-CM | POA: Diagnosis not present

## 2013-08-21 ENCOUNTER — Ambulatory Visit: Payer: Medicare Other | Attending: Family Medicine | Admitting: Physical Therapy

## 2013-08-21 DIAGNOSIS — IMO0001 Reserved for inherently not codable concepts without codable children: Secondary | ICD-10-CM | POA: Insufficient documentation

## 2013-08-21 DIAGNOSIS — R5381 Other malaise: Secondary | ICD-10-CM | POA: Insufficient documentation

## 2013-08-21 DIAGNOSIS — M25569 Pain in unspecified knee: Secondary | ICD-10-CM | POA: Diagnosis not present

## 2013-08-26 ENCOUNTER — Ambulatory Visit: Payer: Medicare Other | Admitting: Physical Therapy

## 2013-08-26 DIAGNOSIS — IMO0001 Reserved for inherently not codable concepts without codable children: Secondary | ICD-10-CM | POA: Diagnosis not present

## 2013-08-28 ENCOUNTER — Ambulatory Visit: Payer: Medicare Other | Admitting: Physical Therapy

## 2013-08-28 DIAGNOSIS — IMO0001 Reserved for inherently not codable concepts without codable children: Secondary | ICD-10-CM | POA: Diagnosis not present

## 2013-09-02 ENCOUNTER — Ambulatory Visit: Payer: Medicare Other | Admitting: Physical Therapy

## 2013-09-02 DIAGNOSIS — IMO0001 Reserved for inherently not codable concepts without codable children: Secondary | ICD-10-CM | POA: Diagnosis not present

## 2013-09-04 ENCOUNTER — Ambulatory Visit: Payer: Medicare Other

## 2013-09-04 DIAGNOSIS — IMO0001 Reserved for inherently not codable concepts without codable children: Secondary | ICD-10-CM | POA: Diagnosis not present

## 2013-09-10 ENCOUNTER — Ambulatory Visit: Payer: Medicare Other | Admitting: Physical Therapy

## 2013-09-10 DIAGNOSIS — IMO0001 Reserved for inherently not codable concepts without codable children: Secondary | ICD-10-CM | POA: Diagnosis not present

## 2013-09-19 ENCOUNTER — Ambulatory Visit: Payer: Medicare Other

## 2013-09-19 DIAGNOSIS — IMO0001 Reserved for inherently not codable concepts without codable children: Secondary | ICD-10-CM | POA: Diagnosis not present

## 2013-09-24 ENCOUNTER — Other Ambulatory Visit: Payer: Self-pay | Admitting: Cardiovascular Disease

## 2013-10-17 ENCOUNTER — Telehealth: Payer: Self-pay | Admitting: Cardiovascular Disease

## 2013-10-17 DIAGNOSIS — R002 Palpitations: Secondary | ICD-10-CM

## 2013-10-17 NOTE — Telephone Encounter (Signed)
Called patient back. States that when he is sitting resting he notices some palpitations in the past 2 to 3 weeks. Denies any chest pain or SOB. States he does not feel like he is in atrial fib. Currently taking Lopressor, Losartan, and Eliquis. Advised will send message to Dr.Nishan to see if he would like the patient to use an event monitor and/or be seen back in the office  and then call him back.

## 2013-10-17 NOTE — Telephone Encounter (Signed)
New message     Pt is having palpitations.  He want to talk to a nurse about this

## 2013-10-22 NOTE — Telephone Encounter (Signed)
Event monitor with daily transmission for a week or two or with symptoms would help see if he is having Paf

## 2013-10-22 NOTE — Telephone Encounter (Signed)
Discussed with patient and he agreed to event monitor. Will forward to [facility] to schedule.

## 2013-10-23 ENCOUNTER — Encounter (INDEPENDENT_AMBULATORY_CARE_PROVIDER_SITE_OTHER): Payer: Medicare Other

## 2013-10-23 ENCOUNTER — Encounter: Payer: Self-pay | Admitting: *Deleted

## 2013-10-23 DIAGNOSIS — R002 Palpitations: Secondary | ICD-10-CM

## 2013-10-23 NOTE — Progress Notes (Signed)
Patient ID: Andrew Perkins, male   DOB: 1950/11/16, 63 y.o.   MRN: [id number] [employer] 30 day cardiac event monitor applied to patient.

## 2013-10-25 ENCOUNTER — Telehealth: Payer: Self-pay | Admitting: Cardiovascular Disease

## 2013-10-25 NOTE — Telephone Encounter (Signed)
New problem    Pt stated he lost his transmitter part of his monitor and need to speak to someone. Please call pt.

## 2013-10-30 NOTE — Telephone Encounter (Signed)
Called pt on 9-4 and explained he would have to call [employer] about his missing phone.

## 2013-12-06 ENCOUNTER — Telehealth: Payer: Self-pay | Admitting: *Deleted

## 2013-12-06 NOTE — Telephone Encounter (Signed)
PT  AWARE OF MONITOR  RESULTS PER  DR NISHAN  SR  PVC'S NO SIG  ARRHYTMIA'S./CY

## 2013-12-19 ENCOUNTER — Other Ambulatory Visit: Payer: Self-pay | Admitting: Cardiovascular Disease

## 2014-01-22 ENCOUNTER — Encounter: Payer: Self-pay | Admitting: Cardiovascular Disease

## 2014-01-22 ENCOUNTER — Ambulatory Visit (INDEPENDENT_AMBULATORY_CARE_PROVIDER_SITE_OTHER): Payer: Medicare Other | Admitting: Cardiovascular Disease

## 2014-01-22 VITALS — BP 122/74 | HR 66 | Ht 75.0 in | Wt 252.4 lb

## 2014-01-22 DIAGNOSIS — I480 Paroxysmal atrial fibrillation: Secondary | ICD-10-CM

## 2014-01-22 DIAGNOSIS — I10 Essential (primary) hypertension: Secondary | ICD-10-CM

## 2014-01-22 DIAGNOSIS — R072 Precordial pain: Secondary | ICD-10-CM

## 2014-01-22 NOTE — Assessment & Plan Note (Signed)
Well controlled.  Continue current medications and low sodium Dash type diet.    

## 2014-01-22 NOTE — Assessment & Plan Note (Signed)
Atypical normal myovue 2014  Continue ASA and beta blocker

## 2014-01-22 NOTE — Progress Notes (Signed)
Patient ID: Andrew DerrickMelvin Perkins, male   DOB: 12/16/1950, 63 y.o.   MRN: [id number] 63 yo [place] in [place]. Indicates heavy ETOH prior to that but none since. Over 3 year history of palpitations. Had a rough time 3 years ago when mother and Wife died within 2 weeks of each other. No documented heart disease. Has a brother with afib in setting of abnormal thyroid. History of HTN. No CAD. Admitted with increasing palpitations and found to be in Afib at end of February . Converted with cardizem and started on eliquis  Echo 2/15  Study Conclusions  - Left ventricle: The cavity size was normal. Wall thickness was increased in a pattern of mild LVH. Systolic function was normal. The estimated ejection fraction was in the range of 60% to 65%. Wall motion was normal; there were no regional wall motion abnormalities. - Right atrium: The atrium was mildly dilated. - Pulmonary arteries: PA peak pressure: 33mm Hg (S).  Getting some atypical chest pains Thinks they are related to skips in heart Has had them 3-4 times since d/c No stress test done in hospital  2 episodes were with exertion No rest tightness   05/16/13 Myovue normal just bowel artifact EF 58%  Still anxious about his heart Feels palpitations from time to time   10/23/13 event monitor with PVC;s no PAF    ROS: Denies fever, malais, weight loss, blurry vision, decreased visual acuity, cough, sputum, SOB, hemoptysis, pleuritic pain, palpitaitons, heartburn, abdominal pain, melena, lower extremity edema, claudication, or rash.  All other systems reviewed and negative  General: Affect appropriate Healthy:  appears stated age HEENT: normal Neck supple with no adenopathy JVP normal no bruits no thyromegaly Lungs clear with no wheezing and good diaphragmatic motion Heart:  S1/S2 no murmur, no rub, gallop or click PMI normal Abdomen: benighn, BS positve, no tenderness, no AAA no bruit.  No HSM or HJR Distal pulses intact  with no bruits No edema Neuro non-focal Skin warm and dry No muscular weakness   Current Outpatient Prescriptions  Medication Sig Dispense Refill  . atorvastatin (LIPITOR) 20 MG tablet Take 1 tablet by mouth daily.    [name]. ELIQUIS 5 MG TABS tablet Take 1 tablet by mouth  twice a day 180 tablet 1  . losartan (COZAAR) 100 MG tablet Take 100 mg by mouth daily.    . metoprolol (LOPRESSOR) 50 MG tablet Take 1 tablet (50 mg total) by mouth 2 (two) times daily. 60 tablet 0  . Omega-3 Fatty Acids (FISH OIL PO) Take 1 capsule by mouth daily.    . psyllium (HYDROCIL/METAMUCIL) 95 % PACK Take 1 packet by mouth daily.     No current facility-administered medications for this visit.    Allergies  Penicillins  Electrocardiogram: 6/15  SR rate 67 normal   Assessment and Plan

## 2014-01-22 NOTE — Patient Instructions (Signed)
Your physician wants you to follow-up in: YEAR WITH DR NISHAN  You will receive a reminder letter in the mail two months in advance. If you don't receive a letter, please call our office to schedule the follow-up appointment.  Your physician recommends that you continue on your current medications as directed. Please refer to the Current Medication list given to you today. 

## 2014-01-22 NOTE — Assessment & Plan Note (Signed)
Maint NSR continue beta blocker and eliquis

## 2014-03-18 ENCOUNTER — Emergency Department (HOSPITAL_COMMUNITY)
Admission: EM | Admit: 2014-03-18 | Discharge: 2014-03-18 | Disposition: A | Discharge status: Home / Self Care | Payer: Medicare Other | Attending: Emergency Medicine | Admitting: Emergency Medicine

## 2014-03-18 ENCOUNTER — Encounter (HOSPITAL_COMMUNITY): Payer: Self-pay | Admitting: *Deleted

## 2014-03-18 ENCOUNTER — Emergency Department (HOSPITAL_COMMUNITY): Payer: Medicare Other

## 2014-03-18 DIAGNOSIS — I10 Essential (primary) hypertension: Secondary | ICD-10-CM | POA: Insufficient documentation

## 2014-03-18 DIAGNOSIS — I480 Paroxysmal atrial fibrillation: Secondary | ICD-10-CM | POA: Insufficient documentation

## 2014-03-18 DIAGNOSIS — Z880 Allergy status to penicillin: Secondary | ICD-10-CM | POA: Diagnosis not present

## 2014-03-18 DIAGNOSIS — R195 Other fecal abnormalities: Secondary | ICD-10-CM | POA: Insufficient documentation

## 2014-03-18 DIAGNOSIS — Z87448 Personal history of other diseases of urinary system: Secondary | ICD-10-CM | POA: Diagnosis not present

## 2014-03-18 DIAGNOSIS — D649 Anemia, unspecified: Secondary | ICD-10-CM

## 2014-03-18 DIAGNOSIS — I4891 Unspecified atrial fibrillation: Secondary | ICD-10-CM | POA: Diagnosis present

## 2014-03-18 DIAGNOSIS — Z79899 Other long term (current) drug therapy: Secondary | ICD-10-CM | POA: Insufficient documentation

## 2014-03-18 DIAGNOSIS — Z7902 Long term (current) use of antithrombotics/antiplatelets: Secondary | ICD-10-CM | POA: Insufficient documentation

## 2014-03-18 DIAGNOSIS — Z862 Personal history of diseases of the blood and blood-forming organs and certain disorders involving the immune mechanism: Secondary | ICD-10-CM | POA: Diagnosis not present

## 2014-03-18 LAB — I-STAT TROPONIN, ED
Troponin i, poc: 0.1 ng/mL (ref 0.00–0.08)
Troponin i, poc: 0.24 ng/mL (ref 0.00–0.08)
Troponin i, poc: 0.26 ng/mL (ref 0.00–0.08)

## 2014-03-18 LAB — BASIC METABOLIC PANEL
Anion gap: 8 (ref 5–15)
BUN: 15 mg/dL (ref 6–23)
CO2: 22 mmol/L (ref 19–32)
Calcium: 9.3 mg/dL (ref 8.4–10.5)
Chloride: 111 mmol/L (ref 96–112)
Creatinine, Ser: 1.09 mg/dL (ref 0.50–1.35)
GFR calc Af Amer: 80 mL/min — ABNORMAL LOW (ref >90)
GFR calc non Af Amer: 69 mL/min — ABNORMAL LOW (ref >90)
Glucose, Bld: 112 mg/dL — ABNORMAL HIGH (ref 70–99)
Potassium: 4 mmol/L (ref 3.5–5.1)
Sodium: 141 mmol/L (ref 135–145)

## 2014-03-18 LAB — CBC
HCT: 38 % — ABNORMAL LOW (ref 39.0–52.0)
Hemoglobin: 12.5 g/dL — ABNORMAL LOW (ref 13.0–17.0)
MCH: 27.8 pg (ref 26.0–34.0)
MCHC: 32.9 g/dL (ref 30.0–36.0)
MCV: 84.4 fL (ref 78.0–100.0)
Platelets: 208 10*3/uL (ref 150–400)
RBC: 4.5 MIL/uL (ref 4.22–5.81)
RDW: 15 % (ref 11.5–15.5)
WBC: 6.8 10*3/uL (ref 4.0–10.5)

## 2014-03-18 LAB — PROTIME-INR
INR: 1.18 (ref 0.00–1.49)
Prothrombin Time: 15.2 seconds (ref 11.6–15.2)

## 2014-03-18 LAB — POC OCCULT BLOOD, ED: Fecal Occult Bld: POSITIVE — AB

## 2014-03-18 MED ORDER — FLECAINIDE ACETATE 150 MG PO TABS: 300.0000 mg | ORAL_TABLET | Freq: Once | ORAL | Status: DC | PRN

## 2014-03-18 MED ORDER — SODIUM CHLORIDE 0.9 % IV BOLUS (SEPSIS)
500.0000 mL | Freq: Once | INTRAVENOUS | Status: AC
  Administered 2014-03-18: 500 mL via INTRAVENOUS

## 2014-03-18 MED ORDER — METOPROLOL TARTRATE 50 MG PO TABS: 75.0000 mg | ORAL_TABLET | Freq: Two times a day (BID) | ORAL | Status: DC

## 2014-03-18 MED ORDER — METOPROLOL TARTRATE 25 MG PO TABS: 25.0000 mg | ORAL_TABLET | Freq: Two times a day (BID) | ORAL | Status: DC

## 2014-03-18 NOTE — Discharge Instructions (Signed)
Bloody Stools Bloody stools often mean that there is a problem in the digestive tract. Your caregiver may use the term "melena" to describe black, tarry, and bad smelling stools or "hematochezia" to describe red or maroon-colored stools. Blood seen in the stool can be caused by bleeding anywhere along the intestinal tract.  A black stool usually means that blood is coming from the upper part of the gastrointestinal tract (esophagus, stomach, or small bowel). Passing maroon-colored stools or bright red blood usually means that blood is coming from lower down in the large bowel or the rectum. However, sometimes massive bleeding in the stomach or small intestine can cause bright red bloody stools.  Consuming black licorice, lead, iron pills, medicines containing bismuth subsalicylate, or blueberries can also cause black stools. Your caregiver can test black stools to see if blood is present. It is important that the cause of the bleeding be found. Treatment can then be started, and the problem can be corrected. Rectal bleeding may not be serious, but you should not assume everything is okay until you know the cause.It is very important to follow up with your caregiver or a specialist in gastrointestinal problems. CAUSES  Blood in the stools can come from various underlying causes.Often, the cause is not found during your first visit. Testing is often needed to discover the cause of bleeding in the gastrointestinal tract. Causes range from simple to serious or even life-threatening.Possible causes include:  Hemorrhoids.These are veins that are full of blood (engorged) in the rectum. They cause pain, inflammation, and may bleed.  Anal fissures.These are areas of painful tearing which may bleed. They are often caused by passing hard stool.  Diverticulosis.These are pouches that form on the colon over time, with age, and may bleed significantly.  Diverticulitis.This is inflammation in areas with  diverticulosis. It can cause pain, fever, and bloody stools, although bleeding is rare.  Proctitis and colitis. These are inflamed areas of the rectum or colon. They may cause pain, fever, and bloody stools.  Polyps and cancer. Colon cancer is a leading cause of preventable cancer death.It often starts out as precancerous polyps that can be removed during a colonoscopy, preventing progression into cancer. Sometimes, polyps and cancer may cause rectal bleeding.  Gastritis and ulcers.Bleeding from the upper gastrointestinal tract (near the stomach) may travel through the intestines and produce black, sometimes tarry, often bad smelling stools. In certain cases, if the bleeding is fast enough, the stools may not be black, but red and the condition may be life-threatening. SYMPTOMS  You may have stools that are bright red and bloody, that are normal color with blood on them, or that are dark black and tarry. In some cases, you may only have blood in the toilet bowl. Any of these cases need medical care. You may also have:  Pain at the anus or anywhere in the rectum.  Lightheadedness or feeling faint.  Extreme weakness.  Nausea or vomiting.  Fever. DIAGNOSIS Your caregiver may use the following methods to find the cause of your bleeding:  Taking a medical history. Age is important. Older people tend to develop polyps and cancer more often. If there is anal pain and a hard, large stool associated with bleeding, a tear of the anus may be the cause. If blood drips into the toilet after a bowel movement, bleeding hemorrhoids may be the problem. The color and frequency of the bleeding are additional considerations. In most cases, the medical history provides clues, but seldom the final  answer.  A visual and finger (digital) exam. Your caregiver will inspect the anal area, looking for tears and hemorrhoids. A finger exam can provide information when there is tenderness or a growth inside. In men, the  prostate is also examined.  Endoscopy. Several types of small, long scopes (endoscopes) are used to view the colon.  In the office, your caregiver may use a rigid, or more commonly, a flexible viewing sigmoidoscope. This exam is called flexible sigmoidoscopy. It is performed in 5 to 10 minutes.  A more thorough exam is accomplished with a colonoscope. It allows your caregiver to view the entire 5 to 6 foot long colon. Medicine to help you relax (sedative) is usually given for this exam. Frequently, a bleeding lesion may be present beyond the reach of the sigmoidoscope. So, a colonoscopy may be the best exam to start with. Both exams are usually done on an outpatient basis. This means the patient does not stay overnight in the hospital or surgery center.  An upper endoscopy may be needed to examine your stomach. Sedation is used and a flexible endoscope is put in your mouth, down to your stomach.  A barium enema X-ray. This is an X-ray exam. It uses liquid barium inserted by enema into the rectum. This test alone may not identify an actual bleeding point. X-rays highlight abnormal shadows, such as those made by lumps (tumors), diverticuli, or colitis. TREATMENT  Treatment depends on the cause of your bleeding.   For bleeding from the stomach or colon, the caregiver doing your endoscopy or colonoscopy may be able to stop the bleeding as part of the procedure.  Inflammation or infection of the colon can be treated with medicines.  Many rectal problems can be treated with creams, suppositories, or warm baths.  Surgery is sometimes needed.  Blood transfusions are sometimes needed if you have lost a lot of blood.  For any bleeding problem, let your caregiver know if you take aspirin or other blood thinners regularly. HOME CARE INSTRUCTIONS   Take any medicines exactly as prescribed.  Keep your stools soft by eating a diet high in fiber. Prunes (1 to 3 a day) work well for many people.  Drink  enough water and fluids to keep your urine clear or pale yellow.  Take sitz baths if advised. A sitz bath is when you sit in a bathtub with warm water for 10 to 15 minutes to soak, soothe, and cleanse the rectal area.  If enemas or suppositories are advised, be sure you know how to use them. Tell your caregiver if you have problems with this.  Monitor your bowel movements to look for signs of improvement or worsening. SEEK MEDICAL CARE IF:   You do not improve in the time expected.  Your condition worsens after initial improvement.  You develop any new symptoms. SEEK IMMEDIATE MEDICAL CARE IF:   You develop severe or prolonged rectal bleeding.  You vomit blood.  You feel weak or faint.  You have a fever. MAKE SURE YOU:  Understand these instructions.  Will watch your condition.  Will get help right away if you are not doing well or get worse. Document Released: 01/28/2002 Document Revised: 05/02/2011 Document Reviewed: 06/25/2010 [facility] Patient Information 2015 [place], [place]. This information is not intended to replace advice given to you by your health care provider. Make sure you discuss any questions you have with your health care provider.  Atrial Fibrillation Atrial fibrillation is a condition that causes your heart to  beat irregularly. It may also cause your heart to beat faster than normal. Atrial fibrillation can prevent your heart from pumping blood normally. It increases your risk of stroke and heart problems. HOME CARE  Take medications as told by your doctor.  Only take medications that your doctor says are safe. Some medications can make the condition worse or happen again.  If blood thinners were prescribed by your doctor, take them exactly as told. Too much can cause bleeding. Too little and you will not have the needed protection against stroke and other problems.  Perform blood tests at home if told by your doctor.  Perform blood tests exactly as told  by your doctor.  Do not drink alcohol.  Do not drink beverages with caffeine such as coffee, soda, and some teas.  Maintain a healthy weight.  Do not use diet pills unless your doctor says they are safe. They may make heart problems worse.  Follow diet instructions as told by your doctor.  Exercise regularly as told by your doctor.  Keep all follow-up appointments. GET HELP IF:  You notice a change in the speed, rhythm, or strength of your heartbeat.  You suddenly begin peeing (urinating) more often.  You get tired more easily when moving or exercising. GET HELP RIGHT AWAY IF:   You have chest or belly (abdominal) pain.  You feel sick to your stomach (nauseous).  You are short of breath.  You suddenly have swollen feet and ankles.  You feel dizzy.  You face, arms, or legs feel numb or weak.  There is a change in your vision or speech. MAKE SURE YOU:   Understand these instructions.  Will watch your condition.  Will get help right away if you are not doing well or get worse. Document Released: 11/17/2007 Document Revised: 06/24/2013 Document Reviewed: 03/20/2012 [facility] Patient Information 2015 [place], [place]. This information is not intended to replace advice given to you by your health care provider. Make sure you discuss any questions you have with your health care provider.

## 2014-03-18 NOTE — H&P (Signed)
Patient ID: Andrew Perkins MRN: [id number], DOB/AGE: 10/28/1950   Admit date: 03/18/2014   Primary Physician: Farris HasMORROW, AARON, MD Primary Cardiologist: Dr. Eden EmmsNishan   Pt. Profile:  64 year old male with known history of PAF on Metoprolol and Eliquis presenting to the ED with complaints of palpitations. Found to be in atrial fibrillation with RVR while en route by EMS. He spontaneously converted to sinus rhythm on IV Cardizem.  Problem List  Past Medical History  Diagnosis Date  . Hypertension   . Erectile dysfunction   . Diabetes mellitus without complication     borderline  . Anemia     a. Mild per pt - previously on iron but did not tolerate.  . Atrial fibrillation     a. Dx 03/2013 (but ~3 yr of palps). Spont converted to NSR.  [name]. Former consumption of alcohol     a. Abstinent as of 03/2013.    Past Surgical History  Procedure Laterality Date  . Tonsillectomy    . Circumcision      at [age]yrs old     Allergies  Allergies  Allergen Reactions  . Penicillins Other (See Comments)    syncope    HPI  The patient is a 64 year old AA male followed by Dr. Eden EmmsNishan. His cardiovascular history is significant for hypertension and paroxysmal atrial fibrillation. This was first diagnosed in February 2015 and resulted in hospitalization. Per records, thyroid studies were normal and he converted with IV Cardizem, but was ultimately placed on 50 mg of Metroprolol twice a day for rate control. He was started on Eliquis for stroke prophylaxis. His CHADS VASC is 2 (hypertension and age 665-74). 2-D echocardiogram February 2015 revealed normal systolic function with an ejection fraction of 60-65% without wall motion abnormalities. The left atrium was noted to be normal in size however the right atrium was mildly dilated. He also underwent ischemic evaluation in March 2015 with a myocardial perfusion study which was normal without evidence of ischemia or infarction. Last office visit with Dr. Eden EmmsNishan  was 01/22/2014. This was a routine visit and he was felt to be stable from a cardiac standpoint.   The patient reports that for the last 2 weeks he has experience 3 episodes of recurrent atrial fibrillation. With the first 2 episodes he experienced tachypalpitations with associated diaphoresis, dizziness, dyspnea, chest discomfort and near syncope. He denies frank syncope. Resting episodes lasted roughly an hour to an hour and a half and resolved spontaneously. He reports full medication compliance with Metroprolol as well as Eliquis. He reports importance of triggers including no excessive caffeine consumption and no alcohol consumption. 2 weeks ago he had a slight fever and chills that lasted for one day however he denies any similar symptoms since then. He has no prior diagnosis of obstructive sleep apnea but has been told by others that he snores when he sleeps and has also been observed to have potential apnea. He also reports that he is often very sleepy/fatigue during the day and takes frequent daytime naps.  Today, the patient had a recurrent episode of irregular tachycardia palpitations associated chest discomfort dizziness and diaphoresis. Over this time, the symptoms did not resolve spontaneously prompting him to call EMS. EKG in the field demonstrated atrial fibrillation with RVR. Sequelae was started on IV Cardizem and transported to the ED. By the time he arrived, he had converted to normal sinus rhythm. Since being in the ED he has remained in normal sinus rhythm with a heart rate in the  70s. He is now asymptomatic. He denies any further chest discomfort. Labs are notable for mild anemia with hemoglobin of 12.5 however review of  records revealed a hemoglobin in the 11-12 range one year ago. FOBT however is positive. He has noticed that his stools have been slightly darker than usual over the last several weeks but denies black/tarry stools. He reports that he had a colonoscopy in 2012 done in  [place] that revealed polyps however he was told that they were benign. He was instructed to get a repeat colonoscopy in 5 years. Point of care troponin in the ED slightly elevated at 0.10. Chest x-ray is unremarkable.      Omega-3 Fatty Acids (FISH OIL PO) Take 1 capsule by mouth daily.   Yes Historical Provider, MD  psyllium (HYDROCIL/METAMUCIL) 95 % PACK Take 1 packet by mouth daily.   Yes Historical Provider, MD    Family History  Family History  Problem Relation Age of Onset  . Diabetes Mellitus II Brother   . Asthma Brother   . CAD Neg Hx     Social History  History   Social History  . Marital Status: Single    Spouse Name: N/A    Number of Children: N/A  . Years of Education: N/A   Occupational History  . Not on file.   Social History Main Topics  . Smoking status: Never Smoker   . Smokeless tobacco: Not on file  . Alcohol Use: No  . Drug Use: No  . Sexual Activity: Not on file   Other Topics Concern  . Not on file   Social History Narrative     Review of Systems General:  No chills, fever, night sweats or weight changes.  Cardiovascular:  No chest pain, dyspnea on exertion, edema, orthopnea, palpitations, paroxysmal nocturnal dyspnea. Dermatological: No rash, lesions/masses Respiratory: No cough, dyspnea Urologic: No hematuria, dysuria Abdominal:   No nausea, vomiting, diarrhea, bright red blood per rectum, melena, or hematemesis Neurologic:  No visual changes, wkns, changes in mental status. All other systems reviewed and are otherwise negative except as noted above.  Physical Exam  Blood pressure 117/75, pulse 75, temperature 98.2 F (36.8 C), temperature source Oral, resp. rate 18, height  (1.905 m), weight 245 lb (111.131 kg), SpO2 94 %.  General: Pleasant, NAD Psych: Normal affect. Neuro: Alert and oriented X 3. Moves all extremities spontaneously. HEENT: Normal  Neck: Supple without bruits or JVD. Lungs:  Resp regular and  unlabored, CTA. Heart: RRR no s3, s4, or murmurs. Abdomen: Soft, non-tender, non-distended, BS + x 4.  Extremities: No clubbing, cyanosis or edema. DP/PT/Radials 2+ and equal bilaterally.  Labs  Troponin [facility] of Care Test)  Recent Labs  03/18/14 1544  TROPIPOC 0.10*   No results for input(s): CKTOTAL, CKMB, TROPONINI in the last 72 hours. Lab Results  Component Value Date   WBC 6.8 03/18/2014   HGB 12.5* 03/18/2014   HCT 38.0* 03/18/2014   MCV 84.4 03/18/2014   PLT 208 03/18/2014    Recent Labs Lab 03/18/14 1521  NA 141  K 4.0  CL 111  CO2 22  BUN 15  CREATININE 1.09  CALCIUM 9.3  GLUCOSE 112*   No results found for: CHOL, HDL, LDLCALC, TRIG No results found for: DDIMER   Radiology/Studies  Dg Chest Port 1 View  03/18/2014   CLINICAL DATA:  Atrial fibrillation, chest pain  EXAM: PORTABLE CHEST - 1 VIEW  COMPARISON:  None.  FINDINGS: Cardiomediastinal silhouette is  unremarkable. No acute infiltrate or pleural effusion. No pulmonary edema. Mild left basilar atelectasis.  IMPRESSION: No acute infiltrate or pulmonary edema. Mild left basilar atelectasis.   Electronically Signed   By: Natasha Mead M.D.   On: 03/18/2014 15:13    ECG  Initial EKG demonstrated atrial fibrillation with RVR. Most recent EKG demonstrates normal sinus rhythm     ASSESSMENT AND PLAN  1. Atrial fibrillation: Patient converted to sinus rhythm with IV Cardizem. Heart rate now in the 70s and symptoms have resolved. He has had multiple recurrent episodes over the last 2 weeks despite full medication compliance and avoidance of triggers. This is also in the setting of no recent illnesses. He has been very symptomatic with this episodes. Recommend further titration of his Metroprolol for added rate control. Increase to 75 mg BID. Also recommend the "Pill-in-the-Pocket" approach with PRN Flecainide, 300 mg x 1, for recurrent prolonged episodes. If no success with this approach then will need referral  to electrophysiology as an outpatient for consideration for either another antiarrhythmic drug or possible A. fib ablation. Will arrange f/u with Dr. Eden Emms. Also recommend OP sleep study to r/o OSA.   2. Elevated troponin: Point of care troponin elevated at 0.10. He had chest discomfort with this atrial fibrillation which resolved after conversion back to normal sinus rhythm. I suspect this troponin leak to be subsequent to demand ischemia in the setting of atrial fibrillation with RVR. He had a nuclear stress test in March 2015 which was low risk for ischemia. Echocardiogram during that time also revealed normal systolic function. However, for reassurance recommend checking a second troponin to assess trend. If no significant change/further elevation, would not recommend any further workup.  3. Anemia: CBC reveals mild anemia with a hemoglobin of 12.5. His hemoglobin one year ago was in the 11-12 range, however fecal occult blood test today in the ED is positive.  On further questioning, he has noticed that his stools have been slightly darker than usual over the last few weeks but denies black/tarry stools.   He reports that he had a colonoscopy in 2012 done in [place] that revealed polyps however he was told that they were benign. He was instructed to get a repeat colonoscopy in 5 years. Recommend repeat CBC in 1 week to reassess.    SignedRobbie Lis, PA-C 03/18/2014, 4:39 PM Patient seen and examined and history reviewed. Agree with above findings and plan. Patient presents with more prolonged episode of Afib today. He has had 2 other episodes in the past 9 days. Now converted to NSR. No clear triggers. Troponin slightly elevated consistent with demand from afib. Will increase metoprolol to 75 mg bid. With prior negative cardiac work up will give Flecainide as a pill-in-a-pocket  x 1. Will arrange for outpatient sleep study since sleep apnea could be a trigger. If he continues to  have more frequent episodes of Afib may need to consider maintenance AAD versus ablation. Will also repeat a CBC on follow up. May need to consider GI evaluation.  Peter [place], MDFACC 03/18/2014 6:01 PM

## 2014-03-18 NOTE — ED Notes (Signed)
MD at bedside. 

## 2014-03-18 NOTE — ED Provider Notes (Signed)
CSN: [id number]     Arrival date & time 03/18/14  1416 History   First MD Initiated Contact with Patient 03/18/14 1455     Chief Complaint  Patient presents with  . Atrial Fibrillation     (Consider location/radiation/quality/duration/timing/severity/associated sxs/prior Treatment) Patient is a 64 y.o. male presenting with atrial fibrillation. The history is provided by the patient.  Atrial Fibrillation This is a recurrent problem. The current episode started 3 to 5 hours ago. The problem occurs constantly. The problem has been resolved. Associated symptoms include chest pain. Pertinent negatives include no abdominal pain, no headaches and no shortness of breath. Nothing aggravates the symptoms. Relieved by: cardizem. Treatments tried: cardizem. The treatment provided significant relief.    Past Medical History  Diagnosis Date  . Hypertension   . Erectile dysfunction   . Diabetes mellitus without complication     borderline  . Anemia     a. Mild per pt - previously on iron but did not tolerate.  . Atrial fibrillation     a. Dx 03/2013 (but ~3 yr of palps). Spont converted to NSR.  [name] Former consumption of alcohol     a. Abstinent as of 03/2013.   Past Surgical History  Procedure Laterality Date  . Tonsillectomy    . Circumcision      at 64yrs old   Family History  Problem Relation Age of Onset  . Diabetes Mellitus II Brother   . Asthma Brother   . CAD Neg Hx    History  Substance Use Topics  . Smoking status: Never Smoker   . Smokeless tobacco: Not on file  . Alcohol Use: No    Review of Systems  Constitutional: Positive for diaphoresis. Negative for fever.  HENT: Negative for drooling and rhinorrhea.   Eyes: Negative for pain.  Respiratory: Negative for cough and shortness of breath.   Cardiovascular: Positive for chest pain. Negative for leg swelling.  Gastrointestinal: Positive for diarrhea. Negative for nausea, vomiting and abdominal pain.  Genitourinary:  Negative for dysuria and hematuria.  Musculoskeletal: Negative for gait problem and neck pain.  Skin: Negative for color change.  Neurological: Positive for dizziness. Negative for numbness and headaches.  Hematological: Negative for adenopathy.  Psychiatric/Behavioral: Negative for behavioral problems.  All other systems reviewed and are negative.     Allergies  Penicillins  Home Medications   Prior to Admission medications   Medication Sig Start Date End Date Taking? Authorizing Provider  atorvastatin (LIPITOR) 20 MG tablet Take 1 tablet by mouth daily. 12/23/13   Historical Provider, MD  ELIQUIS 5 MG TABS tablet Take 1 tablet by mouth  twice a day 12/20/13   Wendall Stade, MD  losartan (COZAAR) 100 MG tablet Take 100 mg by mouth daily.    Historical Provider, MD  metoprolol (LOPRESSOR) 50 MG tablet Take 1 tablet (50 mg total) by mouth 2 (two) times daily. 04/19/13   Zannie Cove, MD  Omega-3 Fatty Acids (FISH OIL PO) Take 1 capsule by mouth daily.    Historical Provider, MD  psyllium (HYDROCIL/METAMUCIL) 95 % PACK Take 1 packet by mouth daily.    Historical Provider, MD   BP 121/68 mmHg  Pulse 78  Temp(Src) 98.2 F (36.8 C) (Oral)  Resp 17  Ht  (1.905 m)  Wt 245 lb (111.131 kg)  BMI 30.62 kg/m2  SpO2 97% Physical Exam  Constitutional: He is oriented to person, place, and time. He appears well-developed and well-nourished.  HENT:  Head: Normocephalic  and atraumatic.  Right Ear: External ear normal.  Left Ear: External ear normal.  Nose: Nose normal.  Mouth/Throat: Oropharynx is clear and moist. No oropharyngeal exudate.  Eyes: Conjunctivae and EOM are normal. Pupils are equal, round, and reactive to light.  Neck: Normal range of motion. Neck supple.  Cardiovascular: Normal rate, regular rhythm, normal heart sounds and intact distal pulses.  Exam reveals no gallop and no friction rub.   No murmur heard. Pulmonary/Chest: Effort normal and breath sounds normal. No  respiratory distress. He has no wheezes.  Abdominal: Soft. Bowel sounds are normal. He exhibits no distension. There is no tenderness. There is no rebound and no guarding.  Genitourinary:  Normal-appearing external rectum.  Musculoskeletal: Normal range of motion. He exhibits no edema or tenderness.  Neurological: He is alert and oriented to person, place, and time.  Skin: Skin is warm and dry.  Psychiatric: He has a normal mood and affect. His behavior is normal.  Nursing note and vitals reviewed.   ED Course  Procedures (including critical care time) Labs Review Labs Reviewed  BASIC METABOLIC PANEL - Abnormal; Notable for the following:    Glucose, Bld 112 (*)    GFR calc non Af Amer 69 (*)    GFR calc Af Amer 80 (*)    All other components within normal limits  CBC - Abnormal; Notable for the following:    Hemoglobin 12.5 (*)    HCT 38.0 (*)    All other components within normal limits  I-STAT TROPOININ, ED - Abnormal; Notable for the following:    Troponin i, poc 0.10 (*)    All other components within normal limits  POC OCCULT BLOOD, ED - Abnormal; Notable for the following:    Fecal Occult Bld POSITIVE (*)    All other components within normal limits  I-STAT TROPOININ, ED - Abnormal; Notable for the following:    Troponin i, poc 0.24 (*)    All other components within normal limits  I-STAT TROPOININ, ED - Abnormal; Notable for the following:    Troponin i, poc 0.26 (*)    All other components within normal limits  PROTIME-INR    Imaging Review Dg Chest Port 1 View  03/18/2014   CLINICAL DATA:  Atrial fibrillation, chest pain  EXAM: PORTABLE CHEST - 1 VIEW  COMPARISON:  None.  FINDINGS: Cardiomediastinal silhouette is unremarkable. No acute infiltrate or pleural effusion. No pulmonary edema. Mild left basilar atelectasis.  IMPRESSION: No acute infiltrate or pulmonary edema. Mild left basilar atelectasis.   Electronically Signed   By: Natasha MeadLiviu  Pop M.D.   On: 03/18/2014  15:13     EKG Interpretation   Date/Time:  Tuesday March 18 2014 14:25:10 EST Ventricular Rate:  75 PR Interval:  193 QRS Duration: 90 QT Interval:  388 QTC Calculation: 433 R Axis:   -8 Text Interpretation:  Sinus rhythm now in sinus rhythm Confirmed by  Allyanna Appleman  MD, Dajohn Ellender ([id number]) on 03/18/2014 2:59:22 PM      MDM   Final diagnoses:  Paroxysmal a-fib  Occult blood positive stool    3:25 PM 64 y.o. male w/ hx of paroxysmal afib on metoprolol and eliquis, HTN, anemia who presents with palpitations. He states that he was diagnosed with atrial fibrillation about one year ago and per the notes he converted with diltiazem. He notes that in the last 10 days he has had 3 episodes of palpitations but none have persisted. He states that he was getting dressed around 1245  today when he had sudden onset palpitations. Due to the persistence he was transferred by EMS to the hospital. He converted after 10 mg of Cardizem in route. EKG shows normal sinus rhythm now and he is asymptomatic. He notes that he felt dizziness, diaphoresis, and some mild left-sided chest pain for about 30 minutes during the episode. He is afebrile and vital signs are unremarkable on exam. He states that he has been taking his medications as prescribed. He has had some mild diarrhea over the last 2 weeks but has been on an antibiotic for a tooth extraction. He denies any fevers. He does note some dark stools recently. Will Hemoccult. We'll get screening labs and imaging.  05/16/13 Myovue normal just bowel artifact EF 58%. Pt follows w/ Dr. Eden Emms (cards).   2nd trop elevated. Discussed with Carolanne Grumbling. Will get a third troponin.  11:01 PM: Third troponin remains mildly elevated but only by .02 more and trend is stabilizing. I discussed this with the cardiology fellow and he agrees. The patient has remained asymptomatic while in the ER for 8 hours. It is unlikely he is having ongoing ischemia in the absence of any  symptoms. Fellow agrees w/ initial assessment that this is related to the inc HR earlier today. I went over the inc in metoprolol and the when necessary flecainide with the patient again to make sure that he understands. I have discussed the diagnosis/risks/treatment options with the patient and believe the pt to be eligible for discharge home to follow-up with GI for the hemeoccult pos stool and Cards for his palpitations. We also discussed returning to the ED immediately if new or worsening sx occur. We discussed the sx which are most concerning (e.g., cp, sob, worsening palpitations, grossly bloody stools) that necessitate immediate return. Medications administered to the patient during their visit and any new prescriptions provided to the patient are listed below.  Medications given during this visit Medications  sodium chloride 0.9 % bolus 500 mL (0 mLs Intravenous Stopped 03/18/14 1629)    Discharge Medication List as of 03/18/2014 11:09 PM    START taking these medications   Details  flecainide (TAMBOCOR) 150 MG tablet Take 2 tablets (300 mg total) by mouth once as needed (prolonged episodes of heart fluttering)., Starting 03/18/2014, Until Discontinued, Print         Purvis Sheffield, MD 03/18/14 2320

## 2014-03-18 NOTE — ED Notes (Signed)
Pt. Left with all belongings and refused wheelchair 

## 2014-03-18 NOTE — ED Notes (Signed)
afib with rvr (135); initial rhythm was in 160's. Ems gave 10 mg of cardizem and pt. Now in nsr.  22 lt. Hand. piv kvo. No cp. No sob. Spells of dizziness.

## 2014-03-27 ENCOUNTER — Ambulatory Visit (INDEPENDENT_AMBULATORY_CARE_PROVIDER_SITE_OTHER): Payer: Medicare Other | Admitting: Physician Assistant

## 2014-03-27 ENCOUNTER — Telehealth: Payer: Self-pay | Admitting: *Deleted

## 2014-03-27 ENCOUNTER — Encounter: Payer: Self-pay | Admitting: Physician Assistant

## 2014-03-27 VITALS — BP 140/80 | HR 53 | Ht 75.0 in | Wt 255.0 lb

## 2014-03-27 DIAGNOSIS — E785 Hyperlipidemia, unspecified: Secondary | ICD-10-CM

## 2014-03-27 DIAGNOSIS — R778 Other specified abnormalities of plasma proteins: Secondary | ICD-10-CM

## 2014-03-27 DIAGNOSIS — I10 Essential (primary) hypertension: Secondary | ICD-10-CM

## 2014-03-27 DIAGNOSIS — R7989 Other specified abnormal findings of blood chemistry: Secondary | ICD-10-CM

## 2014-03-27 DIAGNOSIS — I4891 Unspecified atrial fibrillation: Secondary | ICD-10-CM

## 2014-03-27 DIAGNOSIS — I480 Paroxysmal atrial fibrillation: Secondary | ICD-10-CM

## 2014-03-27 DIAGNOSIS — R079 Chest pain, unspecified: Secondary | ICD-10-CM

## 2014-03-27 LAB — CBC WITH DIFFERENTIAL/PLATELET
Basophils Absolute: 0 10*3/uL (ref 0.0–0.1)
Basophils Relative: 0.4 % (ref 0.0–3.0)
Eosinophils Absolute: 0.3 10*3/uL (ref 0.0–0.7)
Eosinophils Relative: 4.7 % (ref 0.0–5.0)
HCT: 35.8 % — ABNORMAL LOW (ref 39.0–52.0)
Hemoglobin: 12 g/dL — ABNORMAL LOW (ref 13.0–17.0)
Lymphocytes Relative: 33.9 % (ref 12.0–46.0)
Lymphs Abs: 2.1 10*3/uL (ref 0.7–4.0)
MCHC: 33.6 g/dL (ref 30.0–36.0)
MCV: 83.2 fl (ref 78.0–100.0)
Monocytes Absolute: 0.6 10*3/uL (ref 0.1–1.0)
Monocytes Relative: 10.5 % (ref 3.0–12.0)
Neutro Abs: 3.1 10*3/uL (ref 1.4–7.7)
Neutrophils Relative %: 50.5 % (ref 43.0–77.0)
Platelets: 227 10*3/uL (ref 150.0–400.0)
RBC: 4.3 Mil/uL (ref 4.22–5.81)
RDW: 15.4 % (ref 11.5–15.5)
WBC: 6.1 10*3/uL (ref 4.0–10.5)

## 2014-03-27 LAB — BASIC METABOLIC PANEL
BUN: 14 mg/dL (ref 6–23)
CO2: 28 mEq/L (ref 19–32)
Calcium: 9.3 mg/dL (ref 8.4–10.5)
Chloride: 107 mEq/L (ref 96–112)
Creatinine, Ser: 0.96 mg/dL (ref 0.40–1.50)
GFR: 100.82 mL/min (ref >60.00)
Glucose, Bld: 92 mg/dL (ref 70–99)
Potassium: 4.1 mEq/L (ref 3.5–5.1)
Sodium: 139 mEq/L (ref 135–145)

## 2014-03-27 LAB — TROPONIN I: TNIDX: 0 ug/l (ref 0.00–0.06)

## 2014-03-27 MED ORDER — METOPROLOL TARTRATE 50 MG PO TABS: 50.0000 mg | ORAL_TABLET | Freq: Two times a day (BID) | ORAL | Status: DC

## 2014-03-27 NOTE — Progress Notes (Signed)
Cardiology Office Note   Date:  03/27/2014   ID:  Andrew Perkins, DOB 1950/08/06, MRN [id number]  PCP:  Farris Has, MD  Cardiologist:  Dr. Charlton Haws     Chief Complaint  Patient presents with  . Atrial Fibrillation    post ED visit     History of Present Illness: Andrew Perkins is a 64 y.o. male with a hx of paroxysmal atrial fibrillation, HTN, borderline diabetes. He is on Eliquis for anticoagulation. He was seen in the emergency room 03/18/14 with recurrent atrial fibrillation with RVR. This converted to NSR with IV diltiazem en route by EMS.  He described frequent episodes of rapid palpitations prior to this event.  Stool Hemoccult was noted to be positive in the emergency room.  He had mild elevation in his troponins. He was seen by Dr. Swaziland in the emergency room. Metoprolol was increased. He was prescribed flecainide 300 mg for use as needed with a "pill in pocket" approach.  Troponins were thought to be elevated secondary to demand ischemia.  He returns for FU.   Recent Labs  03/18/14 1544 03/18/14 1858 03/18/14 2200  TROPIPOC 0.10* 0.24* 0.26*     Patient tells me that since he went to the emergency room, he has not felt the same. He has a strange sensation in his left chest. He's had a couple of episodes of palpitations. He denies any prolonged rapid palpitations. He feels fatigued and has had a couple of headaches which is unusual for him. He notes dyspnea with exertion. He is NYHA 2-2b. He denies syncope. He denies orthopnea, PND or edema. The discomfort in his left chest is described as a pressure. He denies any associated symptoms. It is difficult for him to describe.   Studies/Reports Reviewed Today:  Nuclear Stress Test (3.26.15) Overall Impression: Low risk stress nuclear study with fixed inferior bowel artifact. No ischemia. Excellent exercise effort. DTS of +5.  LV Ejection Fraction: 58%. LV Wall Motion: Normal Wall Motion  Echocardiogram (2.26.15) Mild  LVH, EF 60-65%, normal wall motion, mild RAE, PASP 33 mmHg     Past Medical History  Diagnosis Date  . Hypertension   . Erectile dysfunction   . Diabetes mellitus without complication     borderline  . Anemia     a. Mild per pt - previously on iron but did not tolerate.  . Atrial fibrillation     a. Dx 03/2013 (but ~3 yr of palps). Spont converted to NSR.  [name] Former consumption of alcohol     a. Abstinent as of 03/2013.    Past Surgical History  Procedure Laterality Date  . Tonsillectomy    . Circumcision      at 64yrs old     Current Outpatient Prescriptions  Medication Sig Dispense Refill  . atorvastatin (LIPITOR) 20 MG tablet Take 1 tablet by mouth daily at 6 PM.     . ELIQUIS 5 MG TABS tablet Take 1 tablet by mouth  twice a day 180 tablet 1  . flecainide (TAMBOCOR) 150 MG tablet Take 2 tablets (300 mg total) by mouth once as needed (prolonged episodes of heart fluttering). 10 tablet 0  . losartan (COZAAR) 100 MG tablet Take 100 mg by mouth daily.    . Melatonin 5 MG TABS Take 1 tablet by mouth daily.    . metoprolol (LOPRESSOR) 50 MG tablet Take 1.5 tablets (75 mg total) by mouth 2 (two) times daily. 60 tablet 0  . metoprolol tartrate (LOPRESSOR) 25  MG tablet Take 1 tablet (25 mg total) by mouth 2 (two) times daily. The patient should add this to his current regimen of metoprolol  bid. He should now take  bid. 60 tablet 0  . Omega-3 Fatty Acids (FISH OIL PO) Take 1 capsule by mouth daily.    . psyllium (HYDROCIL/METAMUCIL) 95 % PACK Take 1 packet by mouth daily.     No current facility-administered medications for this visit.    Allergies:   Penicillins    Social History:  The patient  reports that he has never smoked. He does not have any smokeless tobacco history on file. He reports that he does not drink alcohol or use illicit drugs.   Family History:  The patient's family history includes Asthma in his brother; Diabetes Mellitus II in his brother. There is  no history of CAD, Stroke, or Heart attack.    ROS:  Please see the history of present illness.   Otherwise, review of systems are positive for none.   All other systems are reviewed and negative.    PHYSICAL EXAM: VS:  BP 140/80 mmHg  Pulse 53  Ht  (1.905 m)  Wt 255 lb (115.667 kg)  BMI 31.87 kg/m2    Wt Readings from Last 3 Encounters:  03/27/14 255 lb (115.667 kg)  03/18/14 245 lb (111.131 kg)  01/22/14 252 lb 6.4 oz (114.488 kg)     GEN: Well nourished, well developed, in no acute distress HEENT: normal Neck: no JVD, no masses Cardiac:  Normal S1/S2, RRR; no murmur, no rubs or gallops, no edema  Respiratory:  clear to auscultation bilaterally, no wheezing, rhonchi or rales. GI: soft, nontender, nondistended, + BS MS: no deformity or atrophy Skin: warm and dry  Neuro:  CNs II-XII intact, Strength and sensation are intact Psych: Normal affect   EKG:  EKG is ordered today.  It demonstrates:   Sinus bradycardia, HR 53, T-wave inversion in V4-V5   Recent Labs: 04/17/2013: Magnesium 2.1 04/18/2013: ALT 12; TSH 1.987 03/18/2014: BUN 15; Creatinine 1.09; Hemoglobin 12.5*; Platelets 208; Potassium 4.0; Sodium 141    Lipid Panel No results found for: CHOL, TRIG, HDL, CHOLHDL, VLDL, LDLCALC, LDLDIRECT    ASSESSMENT AND PLAN:  1.  Chest Pain: His symptoms are very atypical. He had a fairly normal stress test less than 12 months ago. He did have an elevated troponin when he went to the hospital. I will ask for recent records from his primary care physician. He apparently had a second troponin drawn at his primary care physician's office.    -  Check troponin today.    -  If troponin elevated, send to the hospital for inpatient workup to include cardiac catheterization.    -  If troponin okay, arrange echocardiogram to assess for reduced EF for wall motion abnormalities. If echocardiogram abnormal, consider cardiac catheterization. 2.  Paroxysmal Atrial Fibrillation:   Currently maintaining sinus rhythm. He does feel somewhat fatigued. Question if this is related to increased dose of beta blocker. He is currently tolerating Eliquis. He did have Hemoccult-positive stools. He has follow-up with gastroenterology soon.    -  Check basic metabolic panel, CBC.    -  Reduce metoprolol to 50 mg twice a day.    -  Arrange 30 day event monitor. If he is having increasing amounts of atrial fibrillation, consider referral to electrophysiology.    -  He has a prescription for flecainide 300 mg (pill in pocket). 3.  Hypertension:  Borderline control. Continue to monitor. 4. Hyperlipidemia:  Continue statin.   Current medicines are reviewed at length with the patient today.  The patient has concerns regarding medicines.  The following changes have been made:  As above.   Labs/ tests ordered today include:   Orders Placed This Encounter  Procedures  . Basic Metabolic Panel (BMET)  . CBC w/Diff  . Troponin I  . EKG 12-Lead  . Cardiac event monitor  . 2D Echocardiogram without contrast     Disposition:   FU with Dr. Charlton HawsPeter Nishan 6 weeks.    Signed, Brynda RimScott Weaver, PA-C, MHS 03/27/2014 11:25 AM    [facility] [address], [place], [place]  [address] Phone: [phone]; Fax: [phone]

## 2014-03-27 NOTE — Patient Instructions (Signed)
Your physician has recommended you make the following change in your medication:  1. DECREASE METOPROLOL TO 50 MG TWICE DAILY  FOLLOW UP WITH DR. Eden EmmsNISHAN IN 6 WEEKS  LAB WORK TODAY BMET, CBC W/DIFF, STAT TROPONIN I  Your physician has requested that you have an echocardiogram. Echocardiography is a painless test that uses sound waves to create images of your heart. It provides your doctor with information about the size and shape of your heart and how well your heart's chambers and valves are working. This procedure takes approximately one hour. There are no restrictions for this procedure.  Your physician has recommended that you wear an event monitor. Event monitors are medical devices that record the heart's electrical activity. Doctors most often us these monitors to diagnose arrhythmias. Arrhythmias are problems with the speed or rhythm of the heartbeat. The monitor is a small, portable device. You can wear one while you do your normal daily activities. This is usually used to diagnose what is causing palpitations/syncope (passing out).

## 2014-03-27 NOTE — Telephone Encounter (Signed)
pt notified about lab results and was advised to make sure to f/u GI as planned. Pt said ok and thank you

## 2014-04-03 ENCOUNTER — Encounter: Payer: Self-pay | Admitting: *Deleted

## 2014-04-03 ENCOUNTER — Encounter: Payer: Self-pay | Admitting: Physician Assistant

## 2014-04-03 ENCOUNTER — Encounter (INDEPENDENT_AMBULATORY_CARE_PROVIDER_SITE_OTHER): Payer: Medicare Other

## 2014-04-03 ENCOUNTER — Ambulatory Visit (HOSPITAL_COMMUNITY): Payer: Medicare Other | Attending: Cardiology | Admitting: Radiology

## 2014-04-03 DIAGNOSIS — R079 Chest pain, unspecified: Secondary | ICD-10-CM | POA: Insufficient documentation

## 2014-04-03 DIAGNOSIS — I4891 Unspecified atrial fibrillation: Secondary | ICD-10-CM

## 2014-04-03 DIAGNOSIS — I480 Paroxysmal atrial fibrillation: Secondary | ICD-10-CM | POA: Diagnosis not present

## 2014-04-03 NOTE — Progress Notes (Signed)
Patient ID: Andrew Perkins, male   DOB: [birth year], 64 y.o.   MRN: [id number] [employer] 30 day cardiac event monitor applied to patient.

## 2014-04-03 NOTE — Progress Notes (Signed)
Echocardiogram performed.  

## 2014-04-04 ENCOUNTER — Telehealth: Payer: Self-pay | Admitting: *Deleted

## 2014-04-04 NOTE — Telephone Encounter (Signed)
pt notified about echo results with verbal understanding 

## 2014-04-07 ENCOUNTER — Telehealth: Payer: Self-pay

## 2014-04-07 NOTE — Telephone Encounter (Signed)
F/U ° ° ° ° ° ° ° ° °Pt is returning call. Please call back. °

## 2014-04-07 NOTE — Telephone Encounter (Signed)
Physician notification criteria report received from [employer].   Called patient to F/U. Left message to call back.

## 2014-04-07 NOTE — Telephone Encounter (Signed)
Patient st he knew he was in Afib yesterday, as his heart was racing.  He st he also had some palpitations during this time period. He said he had a slight "twinge" of chest pain during the episode at pain 2/10 that quickly subsided. Patient st he has been taking his medications as directed and took his PRN Flecainide during the episode.  Afib stopped about 3 hours after Flec administration and has not resumed.   Reviewed with Dr. Shirlee LatchMcLean (DOD), who recommends no changes at this time but to F/U with primary cardiologist.  Forwarded to Dr. Eden EmmsNishan.

## 2014-04-08 NOTE — Telephone Encounter (Signed)
F/U        Pt is returning call from yesterday.    Please call.

## 2014-04-09 NOTE — Telephone Encounter (Signed)
Patient was informed there were no changes and that the message had been sent to Dr. Eden EmmsNishan.  [facility].

## 2014-04-10 ENCOUNTER — Encounter: Payer: Self-pay | Admitting: Physician Assistant

## 2014-04-11 NOTE — Telephone Encounter (Signed)
LM  TO  CALL  BACK  BEFORE   3:00 PM .Andrew Perkins/CY

## 2014-04-15 NOTE — Telephone Encounter (Signed)
PER PT  HAS  NOT  HAD  TO TAKE  ANYMORE   FLECAINIDE  SINCE  MESSAGE  WAS  TAKEN    CONTINUES  TO  HAVE PALPITATIONS  IS  CURRENTLY STILL WEARING  MONITOR  ENCOURAGED TO CONTINUE  TO WEAR  AND  ECHO   LOOKS  GOOD   HAS  APPT  WITH  DR Eden EmmsNISHAN  ON  05-02-14   WILL  FURTHER  EVALUATE  AFTER  MONITOR   COMPLETE   AND  TREAT  BASED ON RESULTS .Zack Seal/CY

## 2014-05-01 NOTE — Progress Notes (Signed)
Patient ID: Andrew Perkins, male   DOB: Jul 16, 1950, 64 y.o.   MRN: [id number]    Cardiology Office Note   Date:  05/01/2014   ID:  Andrew DerrickMelvin Lewis, DOB Jul 16, 1950, MRN [id number]  PCP:  Farris HasMORROW, AARON, MD  Cardiologist:  Dr. Charlton HawsPeter Omar Gayden     No chief complaint on file.    History of Present Illness: Andrew DerrickMelvin Toutant is a 64 y.o. male with a hx of paroxysmal atrial fibrillation, HTN, borderline diabetes. He is on Eliquis for anticoagulation. He was seen in the emergency room 03/18/14 with recurrent atrial fibrillation with RVR. This converted to NSR with IV diltiazem en route by EMS.  He described frequent episodes of rapid palpitations prior to this event.  Stool Hemoccult was noted to be positive in the emergency room.  He had mild elevation in his troponins. He was seen by Dr. SwazilandJordan in the emergency room. Metoprolol was increased. He was prescribed flecainide 300 mg for use as needed with a "pill in pocket" approach.  Troponins were thought to be elevated secondary to demand ischemia.  Used pill in pocket flecainide once.    Event monitor reviewed and showed multiple episdoes of prolonged PAF  He has had 4 long episodes in last 6 months  Taken flecainide pill in pocket once and did not feel It made arrhythmia break any earlier  Discussed options  He prefers to be referred for possible ablation     Studies/Reports Reviewed Today:  Nuclear Stress Test (3.26.15) Overall Impression: Low risk stress nuclear study with fixed inferior bowel artifact. No ischemia. Excellent exercise effort. DTS of +5.  LV Ejection Fraction: 58%. LV Wall Motion: Normal Wall Motion  Echocardiogram (2.26.15) Mild LVH, EF 60-65%, normal wall motion, mild RAE, PASP 33 mmHg     Past Medical History  Diagnosis Date  . Hypertension   . Erectile dysfunction   . Diabetes mellitus without complication     borderline  . Anemia     a. Mild per pt - previously on iron but did not tolerate.  . Atrial fibrillation     a. Dx  03/2013 (but ~3 yr of palps). Spont converted to NSR.  [name]. Former consumption of alcohol     a. Abstinent as of 03/2013.  [name]. Hx of echocardiogram     Echo (2/16):  Mod focal basal and mild concentric LVH, vigorous LVF, EF 65-70%, no RWMA, Gr 1 DD, trivial AI, severe LAE    Past Surgical History  Procedure Laterality Date  . Tonsillectomy    . Circumcision      at [age]yrs old     Current Outpatient Prescriptions  Medication Sig Dispense Refill  . atorvastatin (LIPITOR) 20 MG tablet Take 1 tablet by mouth daily at 6 PM.     . ELIQUIS 5 MG TABS tablet Take 1 tablet by mouth  twice a day 180 tablet 1  . flecainide (TAMBOCOR) 150 MG tablet Take 2 tablets (300 mg total) by mouth once as needed (prolonged episodes of heart fluttering). 10 tablet 0  . losartan (COZAAR) 100 MG tablet Take 100 mg by mouth daily.    . Melatonin 5 MG TABS Take 1 tablet by mouth daily.    . metoprolol (LOPRESSOR) 50 MG tablet Take 1 tablet (50 mg total) by mouth 2 (two) times daily. 60 tablet 11  . metoprolol tartrate (LOPRESSOR) 25 MG tablet Take 1 tablet (25 mg total) by mouth 2 (two) times daily. The patient should add this to his current  regimen of metoprolol  bid. He should now take  bid. 60 tablet 0  . Omega-3 Fatty Acids (FISH OIL PO) Take 1 capsule by mouth daily.    . psyllium (HYDROCIL/METAMUCIL) 95 % PACK Take 1 packet by mouth daily.     No current facility-administered medications for this visit.    Allergies:   Penicillins    Social History:  The patient  reports that he has never smoked. He does not have any smokeless tobacco history on file. He reports that he does not drink alcohol or use illicit drugs.   Family History:  The patient's family history includes Asthma in his brother; Diabetes Mellitus II in his brother. There is no history of CAD, Stroke, or Heart attack.    ROS:  Please see the history of present illness.   Otherwise, review of systems are positive for none.   All other  systems are reviewed and negative.    PHYSICAL EXAM: VS:  There were no vitals taken for this visit.    Wt Readings from Last 3 Encounters:  03/27/14 115.667 kg (255 lb)  03/18/14 111.131 kg (245 lb)  01/22/14 114.488 kg (252 lb 6.4 oz)     GEN: Well nourished, well developed, in no acute distress HEENT: normal Neck: no JVD, no masses Cardiac:  Normal S1/S2, RRR; no murmur, no rubs or gallops, no edema  Respiratory:  clear to auscultation bilaterally, no wheezing, rhonchi or rales. GI: soft, nontender, nondistended, + BS MS: no deformity or atrophy Skin: warm and dry  Neuro:  CNs II-XII intact, Strength and sensation are intact Psych: Normal affect   EKG:  03/27/14   Sinus bradycardia, HR 53, T-wave inversion in V4-V5   Recent Labs: 03/27/2014: BUN 14; Creatinine 0.96; Hemoglobin 12.0*; Platelets 227.0; Potassium 4.1; Sodium 139    Lipid Panel No results found for: CHOL, TRIG, HDL, CHOLHDL, VLDL, LDLCALC, LDLDIRECT    ASSESSMENT AND PLAN:  1.  Chest Pain: His symptoms are very atypical. He had a fairly normal stress test less than 12 months ago. He did have an elevated troponin when he went to the hospital. F/U echo 04/03/14 reviewed and EF normal 65-70%  No RWMA;s  And f/u troponin 0.   2.  Paroxysmal Atrial Fibrillation:  Worsening continue eliquis and beta blocker refer to Dr Johney Frame for possible ablation  3.  Hypertension: Borderline control. Continue to monitor. 4. Hyperlipidemia:  Continue statin.      Labs/ tests ordered today include:   No orders of the defined types were placed in this encounter.     Disposition:   FU with  Dr Johney Frame to consider ablation    Charlton Haws

## 2014-05-02 ENCOUNTER — Ambulatory Visit (INDEPENDENT_AMBULATORY_CARE_PROVIDER_SITE_OTHER): Payer: Medicare Other | Admitting: Cardiovascular Disease

## 2014-05-02 ENCOUNTER — Encounter: Payer: Self-pay | Admitting: Cardiovascular Disease

## 2014-05-02 VITALS — BP 138/82 | HR 68 | Ht 75.0 in | Wt 253.0 lb

## 2014-05-02 DIAGNOSIS — I480 Paroxysmal atrial fibrillation: Secondary | ICD-10-CM

## 2014-05-02 NOTE — Patient Instructions (Signed)
Your physician recommends that you schedule a follow-up appointment in:    3 MONTHS  WITH   DR Eden EmmsNISHAN  AND  NEXT  AVAILABLE  WITH DR Johney FrameALLRED FOR POSSIBLE  AFIB  ABLATION  Your physician recommends that you continue on your current medications as directed. Please refer to the Current Medication list given to you today.

## 2014-05-08 ENCOUNTER — Ambulatory Visit: Payer: Medicare Other | Admitting: Cardiovascular Disease

## 2014-05-09 ENCOUNTER — Telehealth: Payer: Self-pay | Admitting: Cardiovascular Disease

## 2014-05-09 NOTE — Telephone Encounter (Signed)
New Msg       Pt is calling about an appt that was supposed to be scheduled with Dr. Johney FrameAllred.   Pt states he hasn't heard anything.   Please return call.

## 2014-05-09 NOTE — Telephone Encounter (Signed)
Will forward to Glynda JaegerMelissa Tatum to make appointment

## 2014-05-15 ENCOUNTER — Telehealth: Payer: Self-pay | Admitting: *Deleted

## 2014-05-15 NOTE — Telephone Encounter (Signed)
LM TO CALL BACK RE MONITOR  RESULTS  PER  DR NISHAN  RECURRENT   EPISODES  OF PAF .Zack Seal/CY

## 2014-05-16 NOTE — Telephone Encounter (Signed)
lmtcb re: monitor

## 2014-05-16 NOTE — Telephone Encounter (Signed)
Follow up ° ° ° ° °Returning a nurses call °

## 2014-05-16 NOTE — Telephone Encounter (Signed)
Notified of monitor results of recurrent episodes of PAF.  He verbalizes understanding and if has further questions will call back.

## 2014-06-02 ENCOUNTER — Other Ambulatory Visit: Payer: Self-pay

## 2014-06-02 ENCOUNTER — Encounter: Payer: Self-pay | Admitting: Internal Medicine

## 2014-06-02 ENCOUNTER — Ambulatory Visit (INDEPENDENT_AMBULATORY_CARE_PROVIDER_SITE_OTHER): Payer: Medicare Other | Admitting: Internal Medicine

## 2014-06-02 VITALS — BP 122/78 | HR 61 | Ht 75.0 in | Wt 253.4 lb

## 2014-06-02 DIAGNOSIS — I10 Essential (primary) hypertension: Secondary | ICD-10-CM | POA: Diagnosis not present

## 2014-06-02 DIAGNOSIS — I4891 Unspecified atrial fibrillation: Secondary | ICD-10-CM | POA: Diagnosis not present

## 2014-06-02 DIAGNOSIS — E663 Overweight: Secondary | ICD-10-CM | POA: Diagnosis not present

## 2014-06-02 MED ORDER — METOPROLOL TARTRATE 50 MG PO TABS: 25.0000 mg | ORAL_TABLET | Freq: Two times a day (BID) | ORAL | Status: DC

## 2014-06-02 MED ORDER — FLECAINIDE ACETATE 100 MG PO TABS: 100.0000 mg | ORAL_TABLET | Freq: Two times a day (BID) | ORAL | Status: DC

## 2014-06-02 NOTE — Progress Notes (Signed)
Electrophysiology Office Note   Date:  06/02/2014   ID:  Andrew Perkins, DOB 02/14/1951, MRN [id number]  PCP:  Farris HasMORROW, AARON, MD  Cardiologist:  Dr Eden EmmsNishan Primary Electrophysiologist: Hillis RangeJames Kyarah Enamorado, MD    Chief Complaint  Patient presents with  . Atrial Fibrillation    paroxysmal      History of Present Illness: Andrew Perkins is a 64 y.o. male who presents today for electrophysiology evaluation.   The patient reports initially being diagnosed with atrial fibrillation 2/15 after presenting with tachypalpitations.  In retrospect, he thinks that he has had afib for 4-5 years.  Episodes were initially short, lasting several minutes.  At that time, he attributed them to "anxiety".  He developed sustained tachycardia 2/15 and he presented to Dr Vincente LibertyMorrow's office with documented afib on ekg.  He was placed on eliquis and metoprolol.  He reports increasing frequency and duration of atrial fibrillation.  Several months ago, he was given flecainide as a "pill in pocket" approach.  He has required flecainide several times since then.  He had an event monitor placed 2/16 which has documented a more regular narrow complex SVT.  He has not tried daily AAD therapy though  He does take metoprolol twice daily.  He has very frequent palpitations which he finds worrisome.  Today, he denies symptoms of chest pain, shortness of breath, orthopnea, PND, lower extremity edema, claudication, dizziness, presyncope, syncope, bleeding, or neurologic sequela. The patient is tolerating medications without difficulties and is otherwise without complaint today.    Past Medical History  Diagnosis Date  . Hypertension   . Erectile dysfunction   . Diabetes mellitus without complication     borderline  . Anemia     a. Mild per pt - previously on iron but did not tolerate.  . Atrial fibrillation     a. Dx 03/2013 (but ~3 yr of palps). Spont converted to NSR.  [name]. Former consumption of alcohol     a. Abstinent as of 03/2013.  [name].  Hx of echocardiogram     Echo (2/16):  Mod focal basal and mild concentric LVH, vigorous LVF, EF 65-70%, no RWMA, Gr 1 DD, trivial AI, severe LAE  . Left atrial enlargement   . SVT (supraventricular tachycardia)    Past Surgical History  Procedure Laterality Date  . Tonsillectomy    . Circumcision      at [age]yrs old     Current Outpatient Prescriptions  Medication Sig Dispense Refill  . atorvastatin (LIPITOR) 20 MG tablet Take 1 tablet by mouth daily at 6 PM.     . ELIQUIS 5 MG TABS tablet Take 1 tablet by mouth  twice a day 180 tablet 1  . flecainide (TAMBOCOR) 100 MG tablet Take 300 mg by mouth daily as needed (prolonged episodes of heart fluttering).    [name]. losartan (COZAAR) 100 MG tablet Take 100 mg by mouth daily.    . Melatonin 5 MG TABS Take 1 tablet by mouth at bedtime as needed (sleep).     . metoprolol (LOPRESSOR) 50 MG tablet Take 1 tablet (50 mg total) by mouth 2 (two) times daily. 60 tablet 11  . Omega-3 Fatty Acids (FISH OIL PO) Take 1 capsule by mouth daily.    . psyllium (HYDROCIL/METAMUCIL) 95 % PACK Take 1 packet by mouth daily.     No current facility-administered medications for this visit.    Allergies:   Penicillins   Social History:  The patient  reports that he has never  smoked. He does not have any smokeless tobacco history on file. He reports that he does not drink alcohol or use illicit drugs.   Family History:  The patient's  family history includes Asthma in his brother; Diabetes Mellitus II in his brother. There is no history of CAD, Stroke, or Heart attack.    ROS:  Please see the history of present illness.   All other systems are reviewed and negative.    PHYSICAL EXAM: VS:  BP 122/78 mmHg  Pulse 61  Ht  (1.905 m)  Wt 253 lb 6.4 oz (114.941 kg)  BMI 31.67 kg/m2 , BMI Body mass index is 31.67 kg/(m^2). GEN: overweight in no acute distress HEENT: normal Neck: no JVD, carotid bruits, or masses Cardiac: RRR; no murmurs, rubs, or gallops,no  edema  Respiratory:  clear to auscultation bilaterally, normal work of breathing GI: soft, nontender, nondistended, + BS MS: no deformity or atrophy Skin: warm and dry  Neuro:  Strength and sensation are intact Psych: euthymic mood, full affect  EKG:  EKG is ordered today. The ekg ordered today shows sinus rhythm 61 bpm, PR 194, QRS 102, Qtc 408 msec   Recent Labs: 03/27/2014: BUN 14; Creatinine 0.96; Hemoglobin 12.0*; Platelets 227.0; Potassium 4.1; Sodium 139    Lipid Panel  No results found for: CHOL, TRIG, HDL, CHOLHDL, VLDL, LDLCALC, LDLDIRECT   Wt Readings from Last 3 Encounters:  06/02/14 253 lb 6.4 oz (114.941 kg)  05/02/14 253 lb (114.76 kg)  03/27/14 255 lb (115.667 kg)      Other studies Reviewed: Additional studies/ records that were reviewed today include: even monitor, echo, Dr [name] ntoes  Event monitor reveals (>50 pages reviewed personally) predominantly sinus rhythm.  A very regular tachycardia (SVT) at 150 bpm which could represent atrial flutter or possible a short RP tachycardia   ASSESSMENT AND PLAN:  1.  Paroxysmal atrial fibrillation/ SVT The patient has clearly documented atrial fibrillation.  He has recently worn an event monitor which reveals SVT at 150 bpm which could be atrial flutter or a short RP arrhythmia. Therapeutic strategies for his atrial arrhythmias including medicine and ablation were discussed in detail with the patient today.  At this time, he would prefer medical therapy.  I will therefore convert his "pill in pocket" flecainide to flecainide  BID.  Continue eliquis for chads2vasc score of at least 3.  Should he fail medical therapy with flecainide then he may be more willing to consider ablation.  He has had some fatigue which he attributes to metoprolol.  I will therefore reduce metoprolol to  BID.  He will require GXT to exclude exercise induced arrhythmias once stable on flecainide.  2. HTN Stable No change required  today  3. Overweight Body mass index is 31.67 kg/(m^2). Weight loss is advised  Follow-up with Rudi Coco NP in 2 weeks in the AF clinic.  GXT can be arranged at that time.  He will continue to follow with Dr Eden Emms and the AF clinic.  I am happy to see again should he fail medical therapy with flecainide.  Randolm Idol, MD  06/02/2014 9:29 AM     [facility] [address] [place] [address] [phone] (office) [phone] (fax)

## 2014-06-02 NOTE — Patient Instructions (Signed)
Your physician recommends that you schedule a follow-up appointment in: 2 weeks with Rudi Cocoonna Carroll, NP   Your physician has recommended you make the following change in your medication:  1) Decrease Metoprolol to 25 mg twice daily 2) Start Flecainide 100mg  twice daily--no longer use as puill in the pocket   Take twice daily

## 2014-06-16 ENCOUNTER — Encounter (HOSPITAL_COMMUNITY): Payer: Self-pay | Admitting: Nurse Practitioner

## 2014-06-16 ENCOUNTER — Ambulatory Visit (HOSPITAL_COMMUNITY)
Admission: RE | Admit: 2014-06-16 | Discharge: 2014-06-16 | Disposition: A | Discharge status: Home / Self Care | Payer: Medicare Other | Source: Ambulatory Visit | Attending: Nurse Practitioner | Admitting: Nurse Practitioner

## 2014-06-16 VITALS — BP 122/80 | HR 57 | Ht 75.0 in | Wt 248.6 lb

## 2014-06-16 DIAGNOSIS — I4891 Unspecified atrial fibrillation: Secondary | ICD-10-CM | POA: Diagnosis not present

## 2014-06-16 DIAGNOSIS — I480 Paroxysmal atrial fibrillation: Secondary | ICD-10-CM | POA: Diagnosis not present

## 2014-06-16 DIAGNOSIS — R001 Bradycardia, unspecified: Secondary | ICD-10-CM | POA: Insufficient documentation

## 2014-06-16 NOTE — Progress Notes (Signed)
Patient ID: Andrew Perkins, male   DOB: 1950-06-19, 64 y.o.   MRN: [id number]   Date:  06/16/2014   PCP:  Farris Has, MD  Cardiologist:  Dr Eden Emms Primary Electrophysiologist: Hillis Range   Chief Complaint  Patient presents with  . Atrial Fibrillation     History of Present Illness: Andrew Perkins is a 64 y.o. male who presents today for f/u in the afib clinic.   The patient reports initially being diagnosed with atrial fibrillation 2/15 after presenting with tachypalpitations.  In retrospect, he thinks that he has had afib for 4-5 years.  Episodes were initially short, lasting several minutes.  At that time, he attributed them to "anxiety".  He developed sustained tachycardia 2/15 and he presented to Dr Vincente Liberty office with documented afib on ekg.  He was placed on eliquis and metoprolol.  He reports increasing frequency and duration of atrial fibrillation.  Several months ago, he was given flecainide as a "pill in pocket" approach.  He has required flecainide several times since then.  He had an event monitor placed 2/16 which has documented a more regular narrow complex SVT.  He has not tried daily AAD therapy though  He does take metoprolol twice daily.  He has very frequent palpitations which he finds worrisome.  He saw Dr. Johney Frame in consultation 2 weeks ago and was given options to treat afib. He wanted to pursue AAD and avoid ablation at this time. He was started on flecainide 100 mg bid and is here to evaluate tx with drug. He appears to be tolerating without adverse effects and most importantly, has not noted any palpitations while on it. Still has some fatigue even though BB dose recently reduced. He reports a recent negative sleep study. He does try to walk daily. Remains on DOAC with chadsvasc score of 3.  Today, he denies symptoms of chest pain, shortness of breath, orthopnea, PND, lower extremity edema, claudication, dizziness, presyncope, syncope, bleeding, or neurologic sequela.  The patient is tolerating medications without difficulties and is otherwise without complaint today.    Past Medical History  Diagnosis Date  . Hypertension   . Erectile dysfunction   . Diabetes mellitus without complication     borderline  . Anemia     a. Mild per pt - previously on iron but did not tolerate.  . Atrial fibrillation     a. Dx 03/2013 (but ~3 yr of palps). Spont converted to NSR.  [name] Former consumption of alcohol     a. Abstinent as of 03/2013.  [name] Hx of echocardiogram     Echo (2/16):  Mod focal basal and mild concentric LVH, vigorous LVF, EF 65-70%, no RWMA, Gr 1 DD, trivial AI, severe LAE  . Left atrial enlargement   . SVT (supraventricular tachycardia)    Past Surgical History  Procedure Laterality Date  . Tonsillectomy    . Circumcision      at 64yrs old     Current Outpatient Prescriptions  Medication Sig Dispense Refill  . atorvastatin (LIPITOR) 20 MG tablet Take 1 tablet by mouth daily at 6 PM.     . ELIQUIS 5 MG TABS tablet Take 1 tablet by mouth  twice a day 180 tablet 1  . flecainide (TAMBOCOR) 100 MG tablet Take 1 tablet (100 mg total) by mouth 2 (two) times daily. 180 tablet 3  . losartan (COZAAR) 100 MG tablet Take 100 mg by mouth daily.    . Melatonin 5 MG TABS  Take 1 tablet by mouth at bedtime as needed (sleep).     . metoprolol (LOPRESSOR) 50 MG tablet Take 0.5 tablets (25 mg total) by mouth 2 (two) times daily. 90 tablet 3  . Omega-3 Fatty Acids (FISH OIL PO) Take 1 capsule by mouth daily.    . psyllium (HYDROCIL/METAMUCIL) 95 % PACK Take 1 packet by mouth daily.     No current facility-administered medications for this encounter.    Allergies:   Penicillins   Social History:  The patient  reports that he has never smoked. He does not have any smokeless tobacco history on file. He reports that he does not drink alcohol or use illicit drugs.   Family History:  The patient's  family history includes Asthma in his brother; Diabetes Mellitus II  in his brother. There is no history of CAD, Stroke, or Heart attack.    ROS:  Please see the history of present illness.   All other systems are reviewed and negative.    PHYSICAL EXAM: VS:  BP 122/80 mmHg  Pulse 57  Ht 6\' 3"  (1.905 m)  Wt 248 lb 9.6 oz (112.764 kg)  BMI 31.07 kg/m2 , BMI Body mass index is 31.07 kg/(m^2). GEN: overweight in no acute distress HEENT: normal Neck: no JVD, carotid bruits, or masses Cardiac: RRR; no murmurs, rubs, or gallops,no edema  Respiratory:  clear to auscultation bilaterally, normal work of breathing GI: soft, nontender, nondistended, + BS MS: no deformity or atrophy Skin: warm and dry  Neuro:  Strength and sensation are intact Psych: euthymic mood, full affect  EKG:  EKG is ordered today. The ekg ordered today shows sinus rhythm 61 bpm, PR 194, QRS 102, Qtc 408 msec   Recent Labs: 03/27/2014: BUN 14; Creatinine 0.96; Hemoglobin 12.0*; Platelets 227.0; Potassium 4.1; Sodium 139    Lipid Panel  No results found for: CHOL, TRIG, HDL, CHOLHDL, VLDL, LDLCALC, LDLDIRECT   Wt Readings from Last 3 Encounters:  06/02/14 253 lb 6.4 oz (114.941 kg)  05/02/14 253 lb (114.76 kg)  03/27/14 255 lb (115.667 kg)      Other studies Reviewed: Additional studies/ records that were reviewed today include echo, labs, prior EKG's and Dr. Jenel LucksAllred's mote.    ASSESSMENT AND PLAN:  1.  Paroxysmal atrial fibrillation/ SVT The patient has clearly documented atrial fibrillation.  He has recently worn an event monitor which reveals SVT at 150 bpm which could be atrial flutter or a short RP arrhythmia. Continue flecainide 100 mg bid/metoprolol 25 mg bid. Continue eliquis for chads2vasc score of at least 3.  Should he fail medical therapy with flecainide then he may be more willing to consider ablation.  Will schedule GXT to exclude exercise induced arrhythmias  on flecainide.  2. HTN Stable No change required today  3. Overweight Body mass index is 31.07  kg/(m^2). Weight loss is advised   Follow with Dr Eden EmmsNishan as scheduled 6/15 and afib clinic/Dr. Allred as needed.   Don PerkingSigned, Jasmon Mattice, NP  06/16/2014 9:42 AM

## 2014-06-16 NOTE — Patient Instructions (Signed)
A scheduler will be calling you to set up [phone]--- Your physician has requested that you have an exercise tolerance test. For further information please visit [email or web address].

## 2014-06-17 ENCOUNTER — Encounter (HOSPITAL_COMMUNITY): Payer: Medicare Other

## 2014-06-17 NOTE — Addendum Note (Signed)
Encounter addended by: Ihor GullyAskia M Valin Massie, NT on: 06/17/2014  7:54 AM<BR>     Documentation filed: Charges VN

## 2014-06-23 ENCOUNTER — Other Ambulatory Visit: Payer: Self-pay | Admitting: Cardiovascular Disease

## 2014-06-27 ENCOUNTER — Other Ambulatory Visit: Payer: Self-pay

## 2014-06-27 MED ORDER — FLECAINIDE ACETATE 100 MG PO TABS: 100.0000 mg | ORAL_TABLET | Freq: Two times a day (BID) | ORAL | Status: DC

## 2014-07-02 ENCOUNTER — Other Ambulatory Visit (HOSPITAL_COMMUNITY): Payer: Self-pay | Admitting: Nurse Practitioner

## 2014-07-02 ENCOUNTER — Ambulatory Visit (HOSPITAL_COMMUNITY)
Admission: RE | Admit: 2014-07-02 | Discharge: 2014-07-02 | Disposition: A | Discharge status: Home / Self Care | Payer: Medicare Other | Source: Ambulatory Visit | Attending: Nurse Practitioner | Admitting: Nurse Practitioner

## 2014-07-02 DIAGNOSIS — I480 Paroxysmal atrial fibrillation: Secondary | ICD-10-CM | POA: Diagnosis not present

## 2014-07-02 DIAGNOSIS — Z79899 Other long term (current) drug therapy: Secondary | ICD-10-CM | POA: Insufficient documentation

## 2014-07-02 NOTE — Progress Notes (Signed)
ETT completed to eval flecainide arrhthymias - pt at pk exercise did not reach 85% pred max.  No ST changes, + I believe PNCs.  Rare PVC.  MD to follow.

## 2014-07-10 LAB — EXERCISE TOLERANCE TEST
Estimated workload: 10.1 METS
Exercise duration (min): 8 min
Exercise duration (sec): 55 s
MPHR: 154 {beats}/min
Peak BP: 184 mmHg
Peak HR: 122 {beats}/min
Percent HR: 79 %
Percent of predicted max HR: 79 %
RPE: 12
Rest HR: 65 {beats}/min
Stage 1 DBP: 88 mmHg
Stage 1 Grade: 0 %
Stage 1 HR: 64 {beats}/min
Stage 1 SBP: 139 mmHg
Stage 1 Speed: 0 mph
Stage 2 Grade: 0 %
Stage 2 HR: 65 {beats}/min
Stage 2 Speed: 0.3 mph
Stage 3 Grade: 0 %
Stage 3 HR: 65 {beats}/min
Stage 3 Speed: 0.3 mph
Stage 4 Grade: 10 %
Stage 4 HR: 95 {beats}/min
Stage 4 Speed: 1.7 mph
Stage 5 DBP: 71 mmHg
Stage 5 Grade: 12 %
Stage 5 HR: 111 {beats}/min
Stage 5 SBP: 172 mmHg
Stage 5 Speed: 2.5 mph
Stage 6 DBP: 72 mmHg
Stage 6 Grade: 14 %
Stage 6 HR: 122 {beats}/min
Stage 6 SBP: 184 mmHg
Stage 6 Speed: 3.4 mph
Stage 7 DBP: 73 mmHg
Stage 7 Grade: 0 %
Stage 7 HR: 100 {beats}/min
Stage 7 SBP: 174 mmHg
Stage 7 Speed: 0 mph
Stage 8 DBP: 78 mmHg
Stage 8 Grade: 0 %
Stage 8 HR: 70 {beats}/min
Stage 8 SBP: 151 mmHg
Stage 8 Speed: 0 mph

## 2014-07-15 ENCOUNTER — Telehealth: Payer: Self-pay | Admitting: Internal Medicine

## 2014-07-15 NOTE — Telephone Encounter (Signed)
Informed pt that I did not see official results on the ETT. Andrew Cocoonna Carroll, NP is ordering provider so I will forward this information to her nurse, Ventura SellersStacy Carter, for review and follow up on results.

## 2014-07-15 NOTE — Telephone Encounter (Signed)
Left message on voicemail with results of normal stress test results

## 2014-07-15 NOTE — Telephone Encounter (Signed)
New problem   Pt want results of his stress test. Please call pt

## 2014-07-22 ENCOUNTER — Other Ambulatory Visit: Payer: Self-pay | Admitting: Gastroenterology

## 2014-07-22 DIAGNOSIS — K562 Volvulus: Secondary | ICD-10-CM

## 2014-07-22 DIAGNOSIS — R195 Other fecal abnormalities: Secondary | ICD-10-CM

## 2014-08-04 ENCOUNTER — Inpatient Hospital Stay: Admission: RE | Admit: 2014-08-04 | Payer: Medicare Other | Source: Ambulatory Visit

## 2014-08-04 NOTE — Progress Notes (Signed)
Patient ID: Andrew Perkins, male   DOB: Nov 09, 1950, 64 y.o.   MRN: [id number]     Patient ID: Andrew Perkins, male   DOB: 06/12/1950, 64 y.o.   MRN: [id number]   Date:  08/06/2014   PCP:  Farris Has, MD  Cardiologist:  Dr Eden Emms Primary Electrophysiologist: Hillis Range   No chief complaint on file.    History of Present Illness: Andrew Perkins is a 64 y.o. male who presents today for f/u atrial arrhythmias    The patient reports initially being diagnosed with atrial fibrillation 2/15 after presenting with tachypalpitations.  In retrospect, he thinks that he has had afib for 4-5 years.  Episodes were initially short, lasting several minutes.  At that time, he attributed them to "anxiety".  He developed sustained tachycardia 2/15 and he presented to Dr Vincente Liberty office with documented afib on ekg.  He was placed on eliquis and metoprolol.  He reports increasing frequency and duration of atrial fibrillation.  Several months ago, he was given flecainide as a "pill in pocket" approach.  He has required flecainide several times since then.  He had an event monitor placed 2/16 which has documented a more regular narrow complex SVT.  He has not tried daily AAD therapy though  He does take metoprolol twice daily.  He has very frequent palpitations which he finds worrisome.  He saw Dr. Johney Frame in consultation 06/02/14  and was given options to treat afib. He wanted to pursue AAD and avoid ablation at this time. He was started on flecainide 100 mg bid . Remains on DOAC with chadsvasc score of 3.  F/U ETT done 07/02/14 with no QRS widening, arrhythmia or ischemia  No palpitations Some exertional dyspnea mostly going up hills which is chronic    Past Medical History  Diagnosis Date  . Hypertension   . Erectile dysfunction   . Diabetes mellitus without complication     borderline  . Anemia     a. Mild per pt - previously on iron but did not tolerate.  . Atrial fibrillation     a. Dx 03/2013 (but ~3 yr of  palps). Spont converted to NSR.  [name] Former consumption of alcohol     a. Abstinent as of 03/2013.  [name] Hx of echocardiogram     Echo (2/16):  Mod focal basal and mild concentric LVH, vigorous LVF, EF 65-70%, no RWMA, Gr 1 DD, trivial AI, severe LAE  . Left atrial enlargement   . SVT (supraventricular tachycardia)    Past Surgical History  Procedure Laterality Date  . Tonsillectomy    . Circumcision      at 64yrs old     Current Outpatient Prescriptions  Medication Sig Dispense Refill  . atorvastatin (LIPITOR) 20 MG tablet Take 1 tablet by mouth daily at 6 PM.     . ELIQUIS 5 MG TABS tablet Take 1 tablet by mouth  twice a day 180 tablet 0  . esomeprazole (NEXIUM) 40 MG capsule Take 40 mg by mouth daily.  5  . flecainide (TAMBOCOR) 100 MG tablet Take 1 tablet (100 mg total) by mouth 2 (two) times daily. 180 tablet 3  . losartan (COZAAR) 100 MG tablet Take 100 mg by mouth daily.    . Melatonin 5 MG TABS Take 1 tablet by mouth at bedtime as needed (sleep).     . metoprolol (LOPRESSOR) 50 MG tablet Take 0.5 tablets (25 mg total) by mouth 2 (two) times daily. 90 tablet 3  . Omega-3  Fatty Acids (FISH OIL PO) Take 1 capsule by mouth daily.    . psyllium (HYDROCIL/METAMUCIL) 95 % PACK Take 1 packet by mouth daily.     No current facility-administered medications for this visit.    Allergies:   Penicillins   Social History:  The patient  reports that he has never smoked. He does not have any smokeless tobacco history on file. He reports that he does not drink alcohol or use illicit drugs.   Family History:  The patient's  family history includes Asthma in his brother; Diabetes Mellitus II in his brother. There is no history of CAD, Stroke, or Heart attack.    ROS:  Please see the history of present illness.   All other systems are reviewed and negative.    PHYSICAL EXAM: VS:  BP 122/62 mmHg  Pulse 61  Ht  (1.905 m)  Wt 115.214 kg (254 lb)  BMI 31.75 kg/m2  SpO2 97% , BMI Body  mass index is 31.75 kg/(m^2). GEN: overweight in no acute distress HEENT: normal Neck: no JVD, carotid bruits, or masses Cardiac: RRR; no murmurs, rubs, or gallops,no edema  Respiratory:  clear to auscultation bilaterally, normal work of breathing GI: soft, nontender, nondistended, + BS MS: no deformity or atrophy Skin: warm and dry  Neuro:  Strength and sensation are intact Psych: euthymic mood, full affect  EKG:  06/16/14  sinus rhythm 61 bpm, PR 194, QRS 102, Qtc 408 msec   Recent Labs: 03/27/2014: BUN 14; Creatinine, Ser 0.96; Hemoglobin 12.0*; Platelets 227.0; Potassium 4.1; Sodium 139    Lipid Panel  No results found for: CHOL, TRIG, HDL, CHOLHDL, VLDL, LDLCALC, LDLDIRECT   Wt Readings from Last 3 Encounters:  08/06/14 115.214 kg (254 lb)  06/16/14 112.764 kg (248 lb 9.6 oz)  06/02/14 114.941 kg (253 lb 6.4 oz)      Other studies Reviewed: Additional studies/ records that were reviewed today include echo, labs, prior EKG's and Dr. Jenel Lucks mote.    ASSESSMENT AND PLAN:  1.  Paroxysmal atrial fibrillation/ SVT The patient has clearly documented atrial fibrillation.  He has recently worn an event monitor which reveals SVT at 150 bpm which could be atrial flutter or a short RP arrhythmia. Continue flecainide 100 mg bid/metoprolol 25 mg bid. Continue eliquis for chads2vasc score of at least 3.  Should he fail medical therapy with flecainide then he may be more willing to consider ablation.   2. HTN Stable No change required today  3. Overweight Body mass index is 31.75 kg/(m^2). Weight loss is advised   Follow with me in 6 months    Signed, Charlton Haws, MD  08/06/2014 9:28 AM

## 2014-08-06 ENCOUNTER — Ambulatory Visit (INDEPENDENT_AMBULATORY_CARE_PROVIDER_SITE_OTHER): Payer: Medicare Other | Admitting: Cardiovascular Disease

## 2014-08-06 ENCOUNTER — Encounter: Payer: Self-pay | Admitting: Cardiovascular Disease

## 2014-08-06 VITALS — BP 122/62 | HR 61 | Ht 75.0 in | Wt 254.0 lb

## 2014-08-06 DIAGNOSIS — I480 Paroxysmal atrial fibrillation: Secondary | ICD-10-CM | POA: Diagnosis not present

## 2014-08-06 NOTE — Patient Instructions (Signed)
Your physician wants you to follow-up in:  6 MONTHS WITH DR NISHAN  You will receive a reminder letter in the mail two months in advance. If you don't receive a letter, please call our office to schedule the follow-up appointment. Your physician recommends that you continue on your current medications as directed. Please refer to the Current Medication list given to you today. 

## 2014-08-08 ENCOUNTER — Ambulatory Visit
Admission: RE | Admit: 2014-08-08 | Discharge: 2014-08-08 | Disposition: A | Discharge status: Home / Self Care | Payer: Medicare Other | Source: Ambulatory Visit | Attending: Gastroenterology | Admitting: Gastroenterology

## 2014-08-08 DIAGNOSIS — K562 Volvulus: Secondary | ICD-10-CM

## 2014-08-08 DIAGNOSIS — R195 Other fecal abnormalities: Secondary | ICD-10-CM

## 2014-10-02 ENCOUNTER — Other Ambulatory Visit: Payer: Self-pay | Admitting: Cardiovascular Disease

## 2014-11-10 ENCOUNTER — Telehealth: Payer: Self-pay | Admitting: Cardiovascular Disease

## 2014-11-10 NOTE — Telephone Encounter (Signed)
SPOKE WITH PT   COMPLAINED WITH   1  EPISODE  TODAY  OF  ELEVATED  HEART  RATE   AND  B/P  NO OTHER  SYMPTOMS   DID  ADMIT  TO HAVING TEA THIS  AM   AS  WELL AS  DECAF  COFFEE WILL WATCH   CAFFEINE INTAKE  NO  OTHER  SYMPTOMS NOTED   PER PT  TAKES ALL MEDS   AT  ONE TIME   SUGGESTED TAKING LOSARTAN MID  DAY  TO HELP WITH  B/P   WILL  CONTINUE TO MONITOR   WILL CALL IF  SYMPTOMS PERSIST   OR  WORSEN ./CY

## 2014-11-10 NOTE — Telephone Encounter (Signed)
New message   Pt c/o BP issue: STAT if pt c/o blurred vision, one-sided weakness or slurred speech  1. What are your last 5 BP readings? 145/90 heart rate 117;   2. Are you having any other symptoms (ex. Dizziness, headache, blurred vision, passed out)? No  3. What is your BP issue? B/p running high; Pt having palpitations; pt states no SOB or chest pain

## 2014-12-31 ENCOUNTER — Other Ambulatory Visit: Payer: Self-pay | Admitting: Cardiovascular Disease

## 2015-02-24 ENCOUNTER — Other Ambulatory Visit: Payer: Self-pay | Admitting: *Deleted

## 2015-02-24 MED ORDER — ATORVASTATIN CALCIUM 20 MG PO TABS: 20.0000 mg | ORAL_TABLET | Freq: Every day | ORAL | Status: DC

## 2015-02-26 ENCOUNTER — Other Ambulatory Visit: Payer: Self-pay | Admitting: Cardiovascular Disease

## 2015-02-26 DIAGNOSIS — I4891 Unspecified atrial fibrillation: Secondary | ICD-10-CM

## 2015-02-26 MED ORDER — METOPROLOL TARTRATE 50 MG PO TABS: 25.0000 mg | ORAL_TABLET | Freq: Two times a day (BID) | ORAL | Status: DC

## 2015-03-02 ENCOUNTER — Ambulatory Visit: Payer: Medicare Other | Admitting: Cardiovascular Disease

## 2015-03-03 ENCOUNTER — Ambulatory Visit (INDEPENDENT_AMBULATORY_CARE_PROVIDER_SITE_OTHER): Payer: Medicare Other | Admitting: Cardiology

## 2015-03-03 ENCOUNTER — Encounter: Payer: Self-pay | Admitting: Cardiology

## 2015-03-03 VITALS — BP 116/76 | HR 59 | Ht 72.0 in | Wt 253.0 lb

## 2015-03-03 DIAGNOSIS — D500 Iron deficiency anemia secondary to blood loss (chronic): Secondary | ICD-10-CM

## 2015-03-03 DIAGNOSIS — I480 Paroxysmal atrial fibrillation: Secondary | ICD-10-CM | POA: Diagnosis not present

## 2015-03-03 DIAGNOSIS — I10 Essential (primary) hypertension: Secondary | ICD-10-CM

## 2015-03-03 DIAGNOSIS — E663 Overweight: Secondary | ICD-10-CM | POA: Diagnosis not present

## 2015-03-03 NOTE — Patient Instructions (Signed)
Medication Instructions:  None  Labwork: None  Testing/Procedures: None  Follow-Up: Your physician recommends that you schedule a follow-up appointment in: 2-3 months with Rudi Cocoonna Carroll, NP in Afib Clinic.   Any Other Special Instructions Will Be Listed Below (If Applicable).     If you need a refill on your cardiac medications before your next appointment, please call your pharmacy.

## 2015-03-03 NOTE — Progress Notes (Signed)
Cardiology Office Note   Date:  03/03/2015   ID:  Andrew Perkins, DOB 06-Mar-1950, MRN [id number]  PCP:  Farris Has, MD  Cardiologist:  Dr. Eden Emms    Chief Complaint  Patient presents with  . Atrial Fibrillation      History of Present Illness: Andrew Perkins is a 65 y.o. male who presents for PAF.   He initially was diagnosed with atrial fibrillation 2/15 after presenting with tachypalpitations. In retrospect, he thinks that he has had afib for 4-5 years. Episodes were initially short, lasting several minutes. At that time, he attributed them to "anxiety". He developed sustained tachycardia 2/15 and he presented to Dr Vincente Liberty office with documented afib on ekg. He was placed on eliquis and metoprolol. He reports increasing frequency and duration of atrial fibrillation. Several months ago, he was given flecainide as a "pill in pocket" approach. He has required flecainide several times since then. He had an event monitor placed 2/16 which has documented a more regular narrow complex SVT. He does take metoprolol twice daily. He was having very frequent palpitations which he found worrisome.  He saw Dr. Johney Frame in consultation 06/02/14 and was given options to treat afib. He wanted to pursue AAD and avoid ablation at this time. He was started on flecainide 100 mg bid . Remains on DOAC with chadsvasc score of 3.   F/U ETT done 07/02/14 with no QRS widening, arrhythmia or ischemia  Today he relates he does have brief episodes of PAF though not as fast as they used to be. When they occur it is very brief.  He believes it is when he gets a little dehydrated.  One episode that lasted longer with BP increase to 150/90.  He continues on the eliquis.  He has had some melena and some drop in H/H followed by PCP and is to see GI next week.   He is on iron now. No chest pain and he does have some SOB but it is his chronic SOB.      Past Medical History  Diagnosis Date  . Hypertension   .  Erectile dysfunction   . Diabetes mellitus without complication (HCC)     borderline  . Anemia     a. Mild per pt - previously on iron but did not tolerate.  . Atrial fibrillation (HCC)     a. Dx 03/2013 (but ~3 yr of palps). Spont converted to NSR.  [name] Former consumption of alcohol     a. Abstinent as of 03/2013.  [name] Hx of echocardiogram     Echo (2/16):  Mod focal basal and mild concentric LVH, vigorous LVF, EF 65-70%, no RWMA, Gr 1 DD, trivial AI, severe LAE  . Left atrial enlargement   . SVT (supraventricular tachycardia) [facility])     Past Surgical History  Procedure Laterality Date  . Tonsillectomy    . Circumcision      at 65yrs old     Current Outpatient Prescriptions  Medication Sig Dispense Refill  . atorvastatin (LIPITOR) 20 MG tablet Take 1 tablet (20 mg total) by mouth daily at 6 PM. 90 tablet 1  . ELIQUIS 5 MG TABS tablet Take 1 tablet by mouth  twice a day 180 tablet 1  . esomeprazole (NEXIUM) 40 MG capsule Take 40 mg by mouth daily.  5  . ferrous sulfate 325 (65 FE) MG tablet Take 325 mg by mouth daily with breakfast.    . flecainide (TAMBOCOR) 100 MG tablet Take 1 tablet (  100 mg total) by mouth 2 (two) times daily. 180 tablet 3  . losartan (COZAAR) 100 MG tablet Take 100 mg by mouth daily.    . Melatonin 5 MG TABS Take 1 tablet by mouth at bedtime as needed (sleep).     . metoprolol (LOPRESSOR) 50 MG tablet Take 0.5 tablets (25 mg total) by mouth 2 (two) times daily. 90 tablet 1  . Omega-3 Fatty Acids (FISH OIL PO) Take 1 capsule by mouth daily.    . psyllium (HYDROCIL/METAMUCIL) 95 % PACK Take 1 packet by mouth daily.     No current facility-administered medications for this visit.    Allergies:   Penicillins    Social History:  The patient  reports that he has never smoked. He has never used smokeless tobacco. He reports that he does not drink alcohol or use illicit drugs.   Family History:  The patient's family history includes Asthma in his brother; Diabetes  Mellitus II in his brother. There is no history of CAD, Stroke, or Heart attack.    ROS:  General:no colds or fevers, no weight changes Skin:no rashes or ulcers HEENT:no blurred vision, no congestion CV:see HPI PUL:see HPI GI:no diarrhea constipation + melena, no indigestion GU:no hematuria, no dysuria MS:no joint pain, no claudication Neuro:no syncope, no lightheadedness Endo:no diabetes, no thyroid disease  Wt Readings from Last 3 Encounters:  03/03/15 253 lb (114.76 kg)  08/06/14 254 lb (115.214 kg)  06/16/14 248 lb 9.6 oz (112.764 kg)     PHYSICAL EXAM: VS:  BP 116/76 mmHg  Pulse 59  Ht 6' (1.829 m)  Wt 253 lb (114.76 kg)  BMI 34.31 kg/m2 , BMI Body mass index is 34.31 kg/(m^2). General:Pleasant affect, NAD Skin:Warm and dry, brisk capillary refill HEENT:normocephalic, sclera clear, mucus membranes moist Neck:supple, no JVD, no bruits  Heart:S1S2 RRR without murmur, gallup, rub or click Lungs:clear without rales, rhonchi, or wheezes ZOX:WRUE, non tender, + BS, do not palpate liver spleen or masses Ext:no lower ext edema, 2+ pedal pulses, 2+ radial pulses Neuro:alert and oriented X 3, MAE, follows commands, + facial symmetry    EKG:  EKG is ordered today. The ekg ordered today demonstrates Sinus Huston Foley no acute changes.   Recent Labs: 03/27/2014: BUN 14; Creatinine, Ser 0.96; Hemoglobin 12.0*; Platelets 227.0; Potassium 4.1; Sodium 139    Lipid Panel No results found for: CHOL, TRIG, HDL, CHOLHDL, VLDL, LDLCALC, LDLDIRECT     Other studies Reviewed: Additional studies/ records that were reviewed today include: OV notes with EP and a fib clinic. [name]  ECHO: Study Conclusions  - Left ventricle: The cavity size was normal. There was moderate focal basal and mild concentric hypertrophy of the left ventricle. Systolic function was vigorous. The estimated ejection fraction was in the range of 65% to 70%. Wall motion was normal; there were no regional wall  motion abnormalities. Doppler parameters are consistent with abnormal left ventricular relaxation (grade 1 diastolic dysfunction). - Aortic valve: There was trivial regurgitation. - Aortic root: The aortic root was normal in size. - Ascending aorta: The ascending aorta was normal in size. - Mitral valve: There was no regurgitation. - Left atrium: The atrium was severely dilated. - Right ventricle: Systolic pressure was within the normal range. - Right atrium: The atrium was normal in size. - Tricuspid valve: There was trivial regurgitation. - Pulmonic valve: There was no regurgitation. - Pulmonary arteries: Systolic pressure was within the normal range. - Inferior vena cava: The vessel was normal in size. -  Pericardium, extracardiac: There was no pericardial effusion.   ASSESSMENT AND PLAN:  1. Paroxysmal atrial fibrillation/ SVT The patient has clearly documented atrial fibrillation. He has worn an event monitor which reveals SVT at 150 bpm which could be atrial flutter or a short RP arrhythmia. Continue flecainide 100 mg bid/metoprolol 25 mg bid. Continue eliquis for chads2vasc score of at least 3. Should he fail medical therapy with flecainide then he may be more willing to consider ablation.  With more freq bursts of PAF will have him follow up in a fib clinic in 2-3 months to discuss other options with break through on flecainide.   2. HTN Stable No change required today  3. Overweight Weight loss is advised  4. Recent blood loss anemia per PCP- to see GI next week.    Current medicines are reviewed with the patient today.  The patient Has no concerns regarding medicines.  The following changes have been made:  See above Labs/ tests ordered today include:see above  Disposition:   FU:  see above  Nyoka LintSigned, Jaisa Defino R, NP  03/03/2015 3:43 PM    [facility] [address], [place], [place]  [address]/ [address]  [place], [place] Phone: [phone]; Fax: [phone]  [phone]

## 2015-04-03 ENCOUNTER — Other Ambulatory Visit: Payer: Self-pay | Admitting: Cardiovascular Disease

## 2015-05-07 ENCOUNTER — Ambulatory Visit (HOSPITAL_COMMUNITY)
Admission: RE | Admit: 2015-05-07 | Discharge: 2015-05-07 | Disposition: A | Discharge status: Home / Self Care | Payer: Medicare Other | Source: Ambulatory Visit | Attending: Nurse Practitioner | Admitting: Nurse Practitioner

## 2015-05-07 ENCOUNTER — Encounter (HOSPITAL_COMMUNITY): Payer: Self-pay | Admitting: Nurse Practitioner

## 2015-05-07 VITALS — BP 130/72 | HR 64 | Ht 72.0 in | Wt 255.6 lb

## 2015-05-07 DIAGNOSIS — R7303 Prediabetes: Secondary | ICD-10-CM | POA: Diagnosis not present

## 2015-05-07 DIAGNOSIS — I10 Essential (primary) hypertension: Secondary | ICD-10-CM | POA: Insufficient documentation

## 2015-05-07 DIAGNOSIS — Z833 Family history of diabetes mellitus: Secondary | ICD-10-CM | POA: Insufficient documentation

## 2015-05-07 DIAGNOSIS — Z825 Family history of asthma and other chronic lower respiratory diseases: Secondary | ICD-10-CM | POA: Diagnosis not present

## 2015-05-07 DIAGNOSIS — Z880 Allergy status to penicillin: Secondary | ICD-10-CM | POA: Diagnosis not present

## 2015-05-07 DIAGNOSIS — Z79899 Other long term (current) drug therapy: Secondary | ICD-10-CM | POA: Insufficient documentation

## 2015-05-07 DIAGNOSIS — Z6834 Body mass index (BMI) 34.0-34.9, adult: Secondary | ICD-10-CM | POA: Insufficient documentation

## 2015-05-07 DIAGNOSIS — E663 Overweight: Secondary | ICD-10-CM | POA: Insufficient documentation

## 2015-05-07 DIAGNOSIS — I4891 Unspecified atrial fibrillation: Secondary | ICD-10-CM | POA: Diagnosis not present

## 2015-05-07 DIAGNOSIS — I480 Paroxysmal atrial fibrillation: Secondary | ICD-10-CM | POA: Insufficient documentation

## 2015-05-07 DIAGNOSIS — Z7901 Long term (current) use of anticoagulants: Secondary | ICD-10-CM | POA: Insufficient documentation

## 2015-05-07 MED ORDER — METOPROLOL TARTRATE 25 MG PO TABS: 25.0000 mg | ORAL_TABLET | Freq: Two times a day (BID) | ORAL | Status: DC

## 2015-05-07 NOTE — Progress Notes (Signed)
Patient ID: Andrew Perkins, male   DOB: 1950/09/28, 65 y.o.   MRN: [id number]     Patient ID: Andrew Perkins, male   DOB: 1950/09/28, 65 y.o.   MRN: [id number]   Date:  05/07/2015   PCP:  Farris HasMORROW, AARON, MD  Cardiologist:  Dr Eden EmmsNishan Primary Electrophysiologist: Hillis RangeAllred, James   Chief Complaint  Patient presents with  . Atrial Fibrillation     History of Present Illness: Andrew Perkins is a 65 y.o. male who presents today for f/u in the afib clinic.   The patient reports initially being diagnosed with atrial fibrillation 2/15 after presenting with tachypalpitations.  In retrospect, he thinks that he has had afib for 4-5 years.  Episodes were initially short, lasting several minutes.  At that time, he attributed them to "anxiety".  He developed sustained tachycardia 2/15 and he presented to Dr Vincente LibertyMorrow's office with documented afib on ekg.  He was placed on eliquis and metoprolol.  He reports increasing frequency and duration of atrial fibrillation.  Several months ago, he was given flecainide as a "pill in pocket" approach.  He has required flecainide several times since then.  He had an event monitor placed 2/16 which has documented a more regular narrow complex SVT.  He has not tried daily AAD therapy though  He does take metoprolol twice daily.  He has very frequent palpitations which he finds worrisome.  He saw Dr. Johney FrameAllred in consultation 4/16 and was given options to treat afib. He wanted to pursue AAD and avoid ablation. [name]. He was started on flecainide 100 mg bid and is here to evaluate tx with drug. He appears to be tolerating without adverse effects and most importantly has very little afib burden on drug. He noticed afib for one hour last Sunday and this is the most afib that he has noticed.. . He reports  negative sleep study. He does try to walk daily. Remains on DOAC with chadsvasc score of 3. He states that he does have mild anemia and takes iron. He has had a colonoscopy in the last year and did have  polys which were removed.  Today, he denies symptoms of chest pain, shortness of breath, orthopnea, PND, lower extremity edema, claudication, dizziness, presyncope, syncope, bleeding, or neurologic sequela. The patient is tolerating medications without difficulties and is otherwise without complaint today.    Past Medical History  Diagnosis Date  . Hypertension   . Erectile dysfunction   . Diabetes mellitus without complication (HCC)     borderline  . Anemia     a. Mild per pt - previously on iron but did not tolerate.  . Atrial fibrillation (HCC)     a. Dx 03/2013 (but ~3 yr of palps). Spont converted to NSR.  [name]. Former consumption of alcohol     a. Abstinent as of 03/2013.  [name]. Hx of echocardiogram     Echo (2/16):  Mod focal basal and mild concentric LVH, vigorous LVF, EF 65-70%, no RWMA, Gr 1 DD, trivial AI, severe LAE  . Left atrial enlargement   . SVT (supraventricular tachycardia) [facility](HCC)    Past Surgical History  Procedure Laterality Date  . Tonsillectomy    . Circumcision      at 65yrs old     Current Outpatient Prescriptions  Medication Sig Dispense Refill  . atorvastatin (LIPITOR) 20 MG tablet Take 1 tablet (20 mg total) by mouth daily at 6 PM. 90 tablet 1  . ELIQUIS 5 MG TABS tablet Take 1 tablet  by mouth  twice a day 180 tablet 2  . esomeprazole (NEXIUM) 40 MG capsule Take 40 mg by mouth daily.  5  . ferrous sulfate 325 (65 FE) MG tablet Take 325 mg by mouth daily with breakfast.    . flecainide (TAMBOCOR) 100 MG tablet Take 1 tablet (100 mg total) by mouth 2 (two) times daily. 180 tablet 3  . losartan (COZAAR) 100 MG tablet Take 100 mg by mouth daily.    . Melatonin 5 MG TABS Take 1 tablet by mouth at bedtime as needed (sleep).     . metoprolol (LOPRESSOR) 25 MG tablet Take 1 tablet (25 mg total) by mouth 2 (two) times daily. 180 tablet 3  . Omega-3 Fatty Acids (FISH OIL PO) Take 1 capsule by mouth daily.    . psyllium (HYDROCIL/METAMUCIL) 95 % PACK Take 1 packet by  mouth daily.     No current facility-administered medications for this encounter.    Allergies:   Penicillins   Social History:  The patient  reports that he has never smoked. He has never used smokeless tobacco. He reports that he does not drink alcohol or use illicit drugs.   Family History:  The patient's  family history includes Asthma in his brother; Diabetes Mellitus II in his brother. There is no history of CAD, Stroke, or Heart attack.    ROS:  Please see the history of present illness.   All other systems are reviewed and negative.    PHYSICAL EXAM: VS:  BP 130/72 mmHg  Pulse 64  Ht 6' (1.829 m)  Wt 255 lb 9.6 oz (115.939 kg)  BMI 34.66 kg/m2 , BMI Body mass index is 34.66 kg/(m^2). GEN: overweight in no acute distress HEENT: normal Neck: no JVD, carotid bruits, or masses Cardiac: RRR; no murmurs, rubs, or gallops,no edema  Respiratory:  clear to auscultation bilaterally, normal work of breathing GI: soft, nontender, nondistended, + BS MS: no deformity or atrophy Skin: warm and dry  Neuro:  Strength and sensation are intact Psych: euthymic mood, full affect  EKG:  EKG is ordered today. The ekg ordered today shows sinus rhythm 64 bpm, pr int 190 ms, qrs int 108 ms, qtc 433 ms   Recent Labs: No results found for requested labs within last 365 days.    Lipid Panel  No results found for: CHOL, TRIG, HDL, CHOLHDL, VLDL, LDLCALC, LDLDIRECT   Wt Readings from Last 3 Encounters:  05/07/15 255 lb 9.6 oz (115.939 kg)  03/03/15 253 lb (114.76 kg)  08/06/14 254 lb (115.214 kg)      Other studies Reviewed: Additional studies/ records that were reviewed today include echo, labs, prior EKG's and Dr. Jenel Lucks mote.    ASSESSMENT AND PLAN:  1.  Paroxysmal atrial fibrillation/ SVT The patient has clearly documented atrial fibrillation.  He has worn an event monitor which reveals SVT at 150 bpm which could be atrial flutter or a short RP arrhythmia. Continue  flecainide 100 mg bid/metoprolol 25 mg bid. Continue eliquis for chads2vasc score of at least 3.  Should he fail medical therapy with flecainide then he may be more willing to consider ablation.    2. HTN Stable No change required today  3. Overweight Body mass index is 34.66 kg/(m^2). Weight loss is advised   F/u in afib clinic in 3 months    Lupita Leash C. Matthew Folks [facility] [address] [place], [place] [address] [phone]

## 2015-05-21 ENCOUNTER — Other Ambulatory Visit: Payer: Self-pay | Admitting: Internal Medicine

## 2015-08-05 ENCOUNTER — Ambulatory Visit (HOSPITAL_COMMUNITY)
Admission: RE | Admit: 2015-08-05 | Discharge: 2015-08-05 | Disposition: A | Discharge status: Home / Self Care | Payer: Medicare Other | Source: Ambulatory Visit | Attending: Nurse Practitioner | Admitting: Nurse Practitioner

## 2015-08-05 ENCOUNTER — Encounter (HOSPITAL_COMMUNITY): Payer: Self-pay | Admitting: Nurse Practitioner

## 2015-08-05 VITALS — BP 132/78 | HR 61 | Ht 73.0 in | Wt 255.2 lb

## 2015-08-05 DIAGNOSIS — I4891 Unspecified atrial fibrillation: Secondary | ICD-10-CM | POA: Diagnosis present

## 2015-08-05 DIAGNOSIS — I480 Paroxysmal atrial fibrillation: Secondary | ICD-10-CM | POA: Insufficient documentation

## 2015-08-05 DIAGNOSIS — I440 Atrioventricular block, first degree: Secondary | ICD-10-CM | POA: Insufficient documentation

## 2015-08-06 ENCOUNTER — Encounter (HOSPITAL_COMMUNITY): Payer: Self-pay | Admitting: Nurse Practitioner

## 2015-08-06 NOTE — Progress Notes (Signed)
Patient ID: Kyaire Gruenewald, male   DOB: 04-Oct-1950, 65 y.o.   MRN: [id number]     Patient ID: Srihith Aquilino, male   DOB: [birth year], 65 y.o.   MRN: [id number]   Date:  08/06/2015   PCP:  Farris Has, MD  Cardiologist:  Dr Eden Emms Primary Electrophysiologist: Hillis Range   Chief Complaint  Patient presents with  . Atrial Fibrillation     History of Present Illness: Maciej Schweitzer is a 65 y.o. male who presents today for f/u in the afib clinic.   The patient reports initially being diagnosed with atrial fibrillation 2/15 after presenting with tachypalpitations.  In retrospect, he thinks that he has had afib for 4-5 years.  Episodes were initially short, lasting several minutes.  At that time, he attributed them to "anxiety".  He developed sustained tachycardia 2/15 and he presented to Dr Vincente Liberty office with documented afib on ekg.  He was placed on eliquis and metoprolol.  He reports increasing frequency and duration of atrial fibrillation.  Several months ago, he was given flecainide as a "pill in pocket" approach.  He has required flecainide several times since then.  He had an event monitor placed 2/16 which has documented a more regular narrow complex SVT.  He has not tried daily AAD therapy though  He does take metoprolol twice daily.  He has very frequent palpitations which he finds worrisome.  He saw Dr. Johney Frame in consultation 4/16 and was given options to treat afib. He wanted to pursue AAD and avoid ablation. [name] He was started on flecainide 100 mg bid .[name] He appears to be tolerating without adverse effects and most importantly has very little afib burden on drug. He noticed afib for one hour last Sunday and this is the most afib that he has noticed.. . He reports  negative sleep study. He does try to walk daily. Remains on DOAC with chadsvasc score of 3. He states that he does have mild anemia and takes iron. He has had a colonoscopy in the last year and did have polys which were removed.  F/u  clinic 6/15, continue on flecainide and is doing will with litle afib burden. He report that he had 3 episode in early April, longest epiosde was 3 hours at v rate of 102 bpm, next episode 3 days later at 115 bpm, lasted one hour, last epiosde lasted 5 mins, at 99 bpm. Has not any further episodes since that 8th of April. He is still satisfied with afib management. He can now continue with what he is doing in afib vrs getting lightheaded and having to lay down.  Continues on eliquis, no bleeding issues.  Today, he denies symptoms of chest pain, shortness of breath, orthopnea, PND, lower extremity edema, claudication, dizziness, presyncope, syncope, bleeding, or neurologic sequela. The patient is tolerating medications without difficulties and is otherwise without complaint today.    Past Medical History  Diagnosis Date  . Hypertension   . Erectile dysfunction   . Diabetes mellitus without complication (HCC)     borderline  . Anemia     a. Mild per pt - previously on iron but did not tolerate.  . Atrial fibrillation (HCC)     a. Dx 03/2013 (but ~3 yr of palps). Spont converted to NSR.  [name] Former consumption of alcohol     a. Abstinent as of 03/2013.  [name] Hx of echocardiogram     Echo (2/16):  Mod focal basal and mild concentric LVH, vigorous LVF, EF 65-70%,  no RWMA, Gr 1 DD, trivial AI, severe LAE  . Left atrial enlargement   . SVT (supraventricular tachycardia) [facility])    Past Surgical History  Procedure Laterality Date  . Tonsillectomy    . Circumcision      at 65yrs old     Current Outpatient Prescriptions  Medication Sig Dispense Refill  . atorvastatin (LIPITOR) 20 MG tablet Take 1 tablet (20 mg total) by mouth daily at 6 PM. 90 tablet 1  . ELIQUIS 5 MG TABS tablet Take 1 tablet by mouth  twice a day 180 tablet 2  . ferrous sulfate 325 (65 FE) MG tablet Take 325 mg by mouth daily with breakfast.    . flecainide (TAMBOCOR) 100 MG tablet Take 1 tablet by mouth two  times daily 180 tablet  3  . losartan (COZAAR) 100 MG tablet Take 100 mg by mouth daily.    . Melatonin 5 MG TABS Take 1 tablet by mouth at bedtime as needed (sleep).     . metoprolol (LOPRESSOR) 25 MG tablet Take 1 tablet (25 mg total) by mouth 2 (two) times daily. 180 tablet 3  . Omega-3 Fatty Acids (FISH OIL PO) Take 1 capsule by mouth daily.    . psyllium (HYDROCIL/METAMUCIL) 95 % PACK Take 1 packet by mouth daily.    [name] esomeprazole (NEXIUM) 40 MG capsule Take 40 mg by mouth daily. Reported on 08/05/2015  5   No current facility-administered medications for this encounter.    Allergies:   Penicillins   Social History:  The patient  reports that he has never smoked. He has never used smokeless tobacco. He reports that he does not drink alcohol or use illicit drugs.   Family History:  The patient's  family history includes Asthma in his brother; Diabetes Mellitus II in his brother. There is no history of CAD, Stroke, or Heart attack.    ROS:  Please see the history of present illness.   All other systems are reviewed and negative.    PHYSICAL EXAM: VS:  BP 132/78 mmHg  Pulse 61  Ht  (1.854 m)  Wt 255 lb 3.2 oz (115.758 kg)  BMI 33.68 kg/m2 , BMI Body mass index is 33.68 kg/(m^2). GEN: overweight in no acute distress HEENT: normal Neck: no JVD, carotid bruits, or masses Cardiac: RRR; no murmurs, rubs, or gallops,no edema  Respiratory:  clear to auscultation bilaterally, normal work of breathing GI: soft, nontender, nondistended, + BS MS: no deformity or atrophy Skin: warm and dry  Neuro:  Strength and sensation are intact Psych: euthymic mood, full affect  EKG:  EKG is ordered today. The ekg ordered today shows sinus rhythm 61 bpm, qrs int 106 ms, qtc 426 ms Epic records reviewed   Recent Labs: No results found for requested labs within last 365 days.      Wt Readings from Last 3 Encounters:  08/05/15 255 lb 3.2 oz (115.758 kg)  05/07/15 255 lb 9.6 oz (115.939 kg)  03/03/15 253 lb  (114.76 kg)       ASSESSMENT AND PLAN:  1.  Paroxysmal atrial fibrillation/ SVT Continue flecainide 100 mg bid/metoprolol 25 mg bid. Continue eliquis for chads2vasc score of at least 3.  Should he fail medical therapy with flecainide then he may be more willing to consider ablation.    2. HTN Stable No change required today  3. Overweight Body mass index is 33.68 kg/(m^2). Weight loss is advised   F/u in afib clinic in 4  months    Elvina SidleDonna C. Matthew Folksarroll, ANP-C [facility] [address] [place], [place] [address] [phone]

## 2015-08-07 ENCOUNTER — Inpatient Hospital Stay (HOSPITAL_COMMUNITY): Admission: RE | Admit: 2015-08-07 | Payer: Medicare Other | Source: Ambulatory Visit | Admitting: Nurse Practitioner

## 2015-08-19 ENCOUNTER — Other Ambulatory Visit: Payer: Self-pay | Admitting: Cardiovascular Disease

## 2015-09-18 ENCOUNTER — Ambulatory Visit
Admission: RE | Admit: 2015-09-18 | Discharge: 2015-09-18 | Disposition: A | Discharge status: Home / Self Care | Payer: Medicare Other | Source: Ambulatory Visit | Attending: Family Medicine | Admitting: Family Medicine

## 2015-09-18 ENCOUNTER — Other Ambulatory Visit: Payer: Self-pay | Admitting: Family Medicine

## 2015-09-18 DIAGNOSIS — R52 Pain, unspecified: Secondary | ICD-10-CM

## 2015-12-08 ENCOUNTER — Ambulatory Visit (HOSPITAL_COMMUNITY)
Admission: RE | Admit: 2015-12-08 | Discharge: 2015-12-08 | Disposition: A | Discharge status: Home / Self Care | Payer: Medicare Other | Source: Ambulatory Visit | Attending: Nurse Practitioner | Admitting: Nurse Practitioner

## 2015-12-08 ENCOUNTER — Encounter (HOSPITAL_COMMUNITY): Payer: Self-pay | Admitting: Nurse Practitioner

## 2015-12-08 VITALS — BP 130/74 | HR 56 | Ht 73.0 in | Wt 254.4 lb

## 2015-12-08 DIAGNOSIS — Z7901 Long term (current) use of anticoagulants: Secondary | ICD-10-CM | POA: Diagnosis not present

## 2015-12-08 DIAGNOSIS — R001 Bradycardia, unspecified: Secondary | ICD-10-CM | POA: Diagnosis not present

## 2015-12-08 DIAGNOSIS — I10 Essential (primary) hypertension: Secondary | ICD-10-CM | POA: Insufficient documentation

## 2015-12-08 DIAGNOSIS — Z79899 Other long term (current) drug therapy: Secondary | ICD-10-CM | POA: Diagnosis not present

## 2015-12-08 DIAGNOSIS — I471 Supraventricular tachycardia: Secondary | ICD-10-CM | POA: Insufficient documentation

## 2015-12-08 DIAGNOSIS — I480 Paroxysmal atrial fibrillation: Secondary | ICD-10-CM | POA: Diagnosis not present

## 2015-12-08 DIAGNOSIS — Z6833 Body mass index (BMI) 33.0-33.9, adult: Secondary | ICD-10-CM | POA: Insufficient documentation

## 2015-12-08 DIAGNOSIS — I4891 Unspecified atrial fibrillation: Secondary | ICD-10-CM | POA: Diagnosis present

## 2015-12-08 DIAGNOSIS — E663 Overweight: Secondary | ICD-10-CM | POA: Insufficient documentation

## 2015-12-08 NOTE — Progress Notes (Signed)
Patient ID: Andrew Perkins, male   DOB: 05-28-1950, 65 y.o.   MRN: [id number]     Patient ID: Andrew Perkins, male   DOB: 05-28-1950, 65 y.o.   MRN: [id number]   Date:  12/08/2015   PCP:  Farris HasMORROW, AARON, MD  Cardiologist:  Dr Eden EmmsNishan Primary Electrophysiologist: Hillis RangeAllred, James   Chief Complaint  Patient presents with  . Atrial Fibrillation     History of Present Illness: Andrew Perkins is a 65 y.o. male who presents today for f/u in the afib clinic.   The patient reports initially being diagnosed with atrial fibrillation 2/15 after presenting with tachypalpitations.  In retrospect, he thinks that he has had afib for 4-5 years.  Episodes were initially short, lasting several minutes.  At that time, he attributed them to "anxiety".  He developed sustained tachycardia 2/15 and he presented to Dr Vincente LibertyMorrow's office with documented afib on ekg.  He was placed on eliquis and metoprolol.  He reports increasing frequency and duration of atrial fibrillation.  Several months ago, he was given flecainide as a "pill in pocket" approach.  He has required flecainide several times since then.  He had an event monitor placed 2/16 which has documented a more regular narrow complex SVT.  He has not tried daily AAD therapy though  He does take metoprolol twice daily.  He has very frequent palpitations which he finds worrisome.  He saw Dr. Johney FrameAllred in consultation 4/16 and was given options to treat afib. He wanted to pursue AAD and avoid ablation. [name]. He was started on flecainide 100 mg bid .[name]. He appears to be tolerating without adverse effects and most importantly has very little afib burden on drug. He noticed afib for one hour last Sunday and this is the most afib that he has noticed.. . He reports  negative sleep study. He does try to walk daily. Remains on DOAC with chadsvasc score of 3. He states that he does have mild anemia and takes iron. He has had a colonoscopy in the last year and did have polys which were removed.  F/u  clinic 6/15, continue on flecainide and is doing will with litle afib burden. He report that he had 3 episode in early April, longest epiosde was 3 hours at v rate of 102 bpm, next episode 3 days later at 115 bpm, lasted one hour, last epiosde lasted 5 mins, at 99 bpm. Has not any further episodes since that 8th of April. He is still satisfied with afib management. He can now continue with what he is doing in afib vrs getting lightheaded and having to lay down.  Continues on eliquis, no bleeding issues.  F/u in afib clinic 10/17 and reports small afib burden. He is till happy with afib management. He is walking on a regular basis. Afib episodes can last 5 mins to an hour. No bleeding issues with eliquis.  Today, he denies symptoms of chest pain, shortness of breath, orthopnea, PND, lower extremity edema, claudication, dizziness, presyncope, syncope, bleeding, or neurologic sequela. The patient is tolerating medications without difficulties and is otherwise without complaint today.    Past Medical History:  Diagnosis Date  . Anemia    a. Mild per pt - previously on iron but did not tolerate.  . Atrial fibrillation (HCC)    a. Dx 03/2013 (but ~3 yr of palps). Spont converted to NSR.  . Diabetes mellitus without complication (HCC)    borderline  . Erectile dysfunction   . Former consumption of alcohol  a. Abstinent as of 03/2013.  [name] Hx of echocardiogram    Echo (2/16):  Mod focal basal and mild concentric LVH, vigorous LVF, EF 65-70%, no RWMA, Gr 1 DD, trivial AI, severe LAE  . Hypertension   . Left atrial enlargement   . SVT (supraventricular tachycardia) (HCC)    Past Surgical History:  Procedure Laterality Date  . CIRCUMCISION     at 65yrs old  . TONSILLECTOMY       Current Outpatient Prescriptions  Medication Sig Dispense Refill  . atorvastatin (LIPITOR) 20 MG tablet TAKE 1 TABLET BY MOUTH  DAILY AT 6 PM. 90 tablet 3  . ELIQUIS 5 MG TABS tablet Take 1 tablet by mouth  twice a day  180 tablet 2  . esomeprazole (NEXIUM) 40 MG capsule Take 40 mg by mouth daily. Reported on 08/05/2015  5  . ferrous sulfate 325 (65 FE) MG tablet Take 325 mg by mouth daily with breakfast.    . flecainide (TAMBOCOR) 100 MG tablet Take 1 tablet by mouth two  times daily 180 tablet 3  . losartan (COZAAR) 100 MG tablet Take 100 mg by mouth daily.    . Melatonin 5 MG TABS Take 1 tablet by mouth at bedtime as needed (sleep).     . metoprolol (LOPRESSOR) 25 MG tablet Take 1 tablet (25 mg total) by mouth 2 (two) times daily. 180 tablet 3  . Omega-3 Fatty Acids (FISH OIL PO) Take 1 capsule by mouth daily.    . psyllium (HYDROCIL/METAMUCIL) 95 % PACK Take 1 packet by mouth daily.     No current facility-administered medications for this encounter.     Allergies:   Penicillins   Social History:  The patient  reports that he has never smoked. He has never used smokeless tobacco. He reports that he does not drink alcohol or use drugs.   Family History:  The patient's  family history includes Asthma in his brother; Diabetes Mellitus II in his brother.    ROS:  Please see the history of present illness.   All other systems are reviewed and negative.    PHYSICAL EXAM: VS:  BP 130/74 (BP Location: Left Arm, Patient Position: Sitting, Cuff Size: Large)   Pulse (!) 56   Ht 6\' 1"  (1.854 m)   Wt 254 lb 6.4 oz (115.4 kg)   BMI 33.56 kg/m  , BMI Body mass index is 33.56 kg/m. GEN: overweight in no acute distress  HEENT: normal  Neck: no JVD, carotid bruits, or masses Cardiac: RRR; no murmurs, rubs, or gallops,no edema  Respiratory:  clear to auscultation bilaterally, normal work of breathing GI: soft, nontender, nondistended, + BS MS: no deformity or atrophy  Skin: warm and dry  Neuro:  Strength and sensation are intact Psych: euthymic mood, full affect  EKG:  EKG is ordered today. The ekg ordered today shows sinus brady at 56 bpm, qrs int 110 ms, qtc 428 ms Epic records reviewed    Wt  Readings from Last 3 Encounters:  12/08/15 254 lb 6.4 oz (115.4 kg)  08/05/15 255 lb 3.2 oz (115.8 kg)  05/07/15 255 lb 9.6 oz (115.9 kg)       ASSESSMENT AND PLAN:  1.  Paroxysmal atrial fibrillation/ SVT Continue flecainide 100 mg bid/metoprolol 25 mg bid. Continue eliquis for chads2vasc score of at least 3.  Should he fail medical therapy with flecainide then he may be more willing to consider ablation.   2. HTN Stable No change required  today  3. Overweight Body mass index is 33.56 kg/m. Weight loss is advised Continue regular exercise   F/u in afib clinic in 4 months    Lupita Leash C. Matthew Folks [facility] [address] [place], [place] [address] [phone]

## 2016-01-22 ENCOUNTER — Other Ambulatory Visit: Payer: Self-pay | Admitting: Cardiovascular Disease

## 2016-02-23 ENCOUNTER — Other Ambulatory Visit: Payer: Self-pay

## 2016-02-23 MED ORDER — FLECAINIDE ACETATE 100 MG PO TABS
100.0000 mg | ORAL_TABLET | Freq: Two times a day (BID) | ORAL | 3 refills | Status: DC
Start: 2016-02-23 — End: 2016-02-25

## 2016-02-23 MED ORDER — APIXABAN 5 MG PO TABS
5.0000 mg | ORAL_TABLET | Freq: Two times a day (BID) | ORAL | 3 refills | Status: DC
Start: 2016-02-23 — End: 2016-02-25

## 2016-02-23 MED ORDER — METOPROLOL TARTRATE 25 MG PO TABS: 25.0000 mg | ORAL_TABLET | Freq: Two times a day (BID) | ORAL | 3 refills | Status: DC

## 2016-02-24 ENCOUNTER — Other Ambulatory Visit: Payer: Self-pay | Admitting: *Deleted

## 2016-02-24 MED ORDER — METOPROLOL TARTRATE 25 MG PO TABS: 25.0000 mg | ORAL_TABLET | Freq: Two times a day (BID) | ORAL | 0 refills | Status: DC

## 2016-02-25 ENCOUNTER — Other Ambulatory Visit (HOSPITAL_COMMUNITY): Payer: Self-pay | Admitting: *Deleted

## 2016-02-25 ENCOUNTER — Other Ambulatory Visit: Payer: Self-pay | Admitting: *Deleted

## 2016-02-25 MED ORDER — APIXABAN 5 MG PO TABS: 5.0000 mg | ORAL_TABLET | Freq: Two times a day (BID) | ORAL | 0 refills | Status: DC

## 2016-02-25 MED ORDER — FLECAINIDE ACETATE 100 MG PO TABS: 100.0000 mg | ORAL_TABLET | Freq: Two times a day (BID) | ORAL | 3 refills | Status: DC

## 2016-02-25 MED ORDER — FLECAINIDE ACETATE 100 MG PO TABS
100.0000 mg | ORAL_TABLET | Freq: Two times a day (BID) | ORAL | 0 refills | Status: DC
Start: 2016-02-25 — End: 2016-03-04

## 2016-02-26 ENCOUNTER — Other Ambulatory Visit (HOSPITAL_COMMUNITY): Payer: Self-pay | Admitting: *Deleted

## 2016-03-01 DIAGNOSIS — M542 Cervicalgia: Secondary | ICD-10-CM | POA: Diagnosis not present

## 2016-03-02 ENCOUNTER — Telehealth: Payer: Self-pay | Admitting: Cardiovascular Disease

## 2016-03-02 DIAGNOSIS — M542 Cervicalgia: Secondary | ICD-10-CM | POA: Diagnosis not present

## 2016-03-02 NOTE — Telephone Encounter (Signed)
New Message   *STAT* If patient is at the pharmacy, call can be transferred to refill team.   1. Which medications need to be refilled? (please list name of each medication and dose if known)  eliquis 5 mg tablets twice daily flecainide 100 mg twice daily  2. Which pharmacy/location (including street and city if local pharmacy) is medication to be sent to? [facility]  3. Do they need a 30 day or 90 day supply?  needs quantity of 180 day for it to be 90 days.

## 2016-03-04 ENCOUNTER — Other Ambulatory Visit: Payer: Self-pay | Admitting: *Deleted

## 2016-03-04 MED ORDER — FLECAINIDE ACETATE 100 MG PO TABS: 100.0000 mg | ORAL_TABLET | Freq: Two times a day (BID) | ORAL | 0 refills | Status: DC

## 2016-03-08 DIAGNOSIS — M542 Cervicalgia: Secondary | ICD-10-CM | POA: Diagnosis not present

## 2016-03-22 DIAGNOSIS — M542 Cervicalgia: Secondary | ICD-10-CM | POA: Diagnosis not present

## 2016-03-23 ENCOUNTER — Telehealth (HOSPITAL_COMMUNITY): Payer: Self-pay | Admitting: *Deleted

## 2016-03-23 NOTE — Telephone Encounter (Signed)
Pt called in stating he is having more breakthrough afib. He has been in afib every day for the last 5 days. He did recently go out of town and has been under more stress. The afib episodes only last 1-4 hours and HR in the 110s. Instructed pt he can take an extra 1/2 tablet of metoprolol when afib starts to help convert back to normal rhythm. He will call back if afib episodes continue to be more frequent.

## 2016-03-31 DIAGNOSIS — M542 Cervicalgia: Secondary | ICD-10-CM | POA: Diagnosis not present

## 2016-04-05 DIAGNOSIS — M542 Cervicalgia: Secondary | ICD-10-CM | POA: Diagnosis not present

## 2016-04-07 ENCOUNTER — Encounter (HOSPITAL_COMMUNITY): Payer: Self-pay | Admitting: Nurse Practitioner

## 2016-04-07 ENCOUNTER — Ambulatory Visit (HOSPITAL_COMMUNITY)
Admission: RE | Admit: 2016-04-07 | Discharge: 2016-04-07 | Disposition: A | Discharge status: Home / Self Care | Payer: PPO | Source: Ambulatory Visit | Attending: Nurse Practitioner | Admitting: Nurse Practitioner

## 2016-04-07 VITALS — BP 128/82 | HR 55 | Ht 73.0 in | Wt 257.0 lb

## 2016-04-07 DIAGNOSIS — I480 Paroxysmal atrial fibrillation: Secondary | ICD-10-CM

## 2016-04-07 DIAGNOSIS — Z79899 Other long term (current) drug therapy: Secondary | ICD-10-CM | POA: Insufficient documentation

## 2016-04-07 DIAGNOSIS — I471 Supraventricular tachycardia: Secondary | ICD-10-CM | POA: Diagnosis not present

## 2016-04-07 DIAGNOSIS — E663 Overweight: Secondary | ICD-10-CM | POA: Diagnosis not present

## 2016-04-07 DIAGNOSIS — Z880 Allergy status to penicillin: Secondary | ICD-10-CM | POA: Diagnosis not present

## 2016-04-07 DIAGNOSIS — Z6833 Body mass index (BMI) 33.0-33.9, adult: Secondary | ICD-10-CM | POA: Diagnosis not present

## 2016-04-07 DIAGNOSIS — Z7901 Long term (current) use of anticoagulants: Secondary | ICD-10-CM | POA: Insufficient documentation

## 2016-04-07 DIAGNOSIS — I119 Hypertensive heart disease without heart failure: Secondary | ICD-10-CM | POA: Insufficient documentation

## 2016-04-07 DIAGNOSIS — I4891 Unspecified atrial fibrillation: Secondary | ICD-10-CM | POA: Diagnosis not present

## 2016-04-07 MED ORDER — FLECAINIDE ACETATE 100 MG PO TABS: 100.0000 mg | ORAL_TABLET | Freq: Two times a day (BID) | ORAL | 2 refills | Status: DC

## 2016-04-07 MED ORDER — APIXABAN 5 MG PO TABS: 5.0000 mg | ORAL_TABLET | Freq: Two times a day (BID) | ORAL | 2 refills | Status: DC

## 2016-04-07 NOTE — Progress Notes (Signed)
Patient ID: Andrew Perkins, male   DOB: 1950-07-14, 66 y.o.   MRN: [id number]     Patient ID: Andrew Perkins, male   DOB: 1951/02/11, 66 y.o.   MRN: [id number]   Date:  04/07/2016   PCP:  Farris Has, MD  Cardiologist:  Dr Eden Emms Primary Electrophysiologist: Hillis Range   Chief Complaint  Patient presents with  . Atrial Fibrillation     History of Present Illness: Andrew Perkins is a 65 y.o. male who presents today for f/u in the afib clinic.   The patient reports initially being diagnosed with atrial fibrillation 2/15 after presenting with tachypalpitations.  In retrospect, he thinks that he has had afib for 4-5 years.  Episodes were initially short, lasting several minutes.  At that time, he attributed them to "anxiety".  He developed sustained tachycardia 2/15 and he presented to Dr Vincente Liberty office with documented afib on ekg.  He was placed on eliquis and metoprolol.  He reports increasing frequency and duration of atrial fibrillation.  Several months ago, he was given flecainide as a "pill in pocket" approach.  He has required flecainide several times since then.  He had an event monitor placed 2/16 which has documented a more regular narrow complex SVT.  He has not tried daily AAD therapy though  He does take metoprolol twice daily.  He has very frequent palpitations which he finds worrisome.  He saw Dr. Johney Frame in consultation 4/16 and was given options to treat afib. He wanted to pursue AAD and avoid ablation. [name] He was started on flecainide 100 mg bid .[name] He appears to be tolerating without adverse effects and most importantly has very little afib burden on drug. He noticed afib for one hour last Sunday and this is the most afib that he has noticed.. . He reports  negative sleep study. He does try to walk daily. Remains on DOAC with chadsvasc score of 3. He states that he does have mild anemia and takes iron. He has had a colonoscopy in the last year and did have polys which were removed.  F/u  clinic 6/15, continue on flecainide and is doing will with litle afib burden. He report that he had 3 episode in early April, longest epiosde was 3 hours at v rate of 102 bpm, next episode 3 days later at 115 bpm, lasted one hour, last epiosde lasted 5 mins, at 99 bpm. Has not any further episodes since that 8th of April. He is still satisfied with afib management. He can now continue with what he is doing in afib vrs getting lightheaded and having to lay down.  Continues on eliquis, no bleeding issues.  F/u in afib clinic 10/17 and reports small afib burden. He is till happy with afib management. He is walking on a regular basis. Afib episodes can last 5 mins to an hour. No bleeding issues with eliquis.  F/u in afib clinc 04/07/16. He reports increase in afib burden over the last week because he traveled out of town and was out of his usual routine with diet and sleeping. He still is happy with flecainide and wants to continue with drug.   Today, he denies symptoms of chest pain, shortness of breath, orthopnea, PND, lower extremity edema, claudication, dizziness, presyncope, syncope, bleeding, or neurologic sequela. The patient is tolerating medications without difficulties and is otherwise without complaint today.    Past Medical History:  Diagnosis Date  . Anemia    a. Mild per pt - previously on iron  but did not tolerate.  . Atrial fibrillation (HCC)    a. Dx 03/2013 (but ~3 yr of palps). Spont converted to NSR.  . Diabetes mellitus without complication (HCC)    borderline  . Erectile dysfunction   . Former consumption of alcohol    a. Abstinent as of 03/2013.  [name] Hx of echocardiogram    Echo (2/16):  Mod focal basal and mild concentric LVH, vigorous LVF, EF 65-70%, no RWMA, Gr 1 DD, trivial AI, severe LAE  . Hypertension   . Left atrial enlargement   . SVT (supraventricular tachycardia) (HCC)    Past Surgical History:  Procedure Laterality Date  . CIRCUMCISION     at 66yrs old  .  TONSILLECTOMY       Current Outpatient Prescriptions  Medication Sig Dispense Refill  . atorvastatin (LIPITOR) 20 MG tablet TAKE 1 TABLET BY MOUTH  DAILY AT 6 PM. 90 tablet 3  . esomeprazole (NEXIUM) 40 MG capsule Take 40 mg by mouth daily. Reported on 08/05/2015  5  . ferrous sulfate 325 (65 FE) MG tablet Take 325 mg by mouth daily with breakfast.    . losartan (COZAAR) 100 MG tablet Take 100 mg by mouth daily.    . Melatonin 5 MG TABS Take 1 tablet by mouth at bedtime as needed (sleep).     . metoprolol tartrate (LOPRESSOR) 25 MG tablet Take 1 tablet (25 mg total) by mouth 2 (two) times daily. 20 tablet 0  . Omega-3 Fatty Acids (FISH OIL PO) Take 1 capsule by mouth daily.    . psyllium (HYDROCIL/METAMUCIL) 95 % PACK Take 1 packet by mouth daily.    [name] apixaban (ELIQUIS) 5 MG TABS tablet Take 1 tablet (5 mg total) by mouth 2 (two) times daily. 180 tablet 2  . flecainide (TAMBOCOR) 100 MG tablet Take 1 tablet (100 mg total) by mouth 2 (two) times daily. 180 tablet 2   No current facility-administered medications for this encounter.     Allergies:   Penicillins   Social History:  The patient  reports that he has never smoked. He has never used smokeless tobacco. He reports that he does not drink alcohol or use drugs.   Family History:  The patient's  family history includes Asthma in his brother; Diabetes Mellitus II in his brother.    ROS:  Please see the history of present illness.   All other systems are reviewed and negative.    PHYSICAL EXAM: VS:  BP 128/82   Pulse (!) 55   Ht 6\' 1"  (1.854 m)   Wt 257 lb (116.6 kg)   BMI 33.91 kg/m  , BMI Body mass index is 33.91 kg/m. GEN: overweight in no acute distress  HEENT: normal  Neck: no JVD, carotid bruits, or masses Cardiac: RRR; no murmurs, rubs, or gallops,no edema  Respiratory:  clear to auscultation bilaterally, normal work of breathing GI: soft, nontender, nondistended, + BS MS: no deformity or atrophy  Skin: warm and  dry  Neuro:  Strength and sensation are intact Psych: euthymic mood, full affect  EKG:  EKG is ordered today. The ekg ordered today shows sinus brady at 55 bpm, qrs int , qtc 438 ms Epic records reviewed    Wt Readings from Last 3 Encounters:  04/07/16 257 lb (116.6 kg)  12/08/15 254 lb 6.4 oz (115.4 kg)  08/05/15 255 lb 3.2 oz (115.8 kg)       ASSESSMENT AND PLAN:  1.  Paroxysmal atrial fibrillation/ SVT  Continue flecainide 100 mg bid/metoprolol 25 mg bid. Continue eliquis for chads2vasc score of at least 3.  Should he fail medical therapy with flecainide then he may be more willing to consider ablation.   2. HTN Stable No change required today  3. Overweight Body mass index is 33.91 kg/m. Weight loss is advised Continue regular exercise   F/u in afib clinic in 6 months    Lupita LeashDonna C. Matthew Folksarroll, ANP-C [facility] [address] [place], [place] [address] [phone]

## 2016-04-11 DIAGNOSIS — I10 Essential (primary) hypertension: Secondary | ICD-10-CM | POA: Diagnosis not present

## 2016-04-11 DIAGNOSIS — D649 Anemia, unspecified: Secondary | ICD-10-CM | POA: Diagnosis not present

## 2016-04-11 DIAGNOSIS — R7309 Other abnormal glucose: Secondary | ICD-10-CM | POA: Diagnosis not present

## 2016-04-11 DIAGNOSIS — M542 Cervicalgia: Secondary | ICD-10-CM | POA: Diagnosis not present

## 2016-04-11 DIAGNOSIS — E785 Hyperlipidemia, unspecified: Secondary | ICD-10-CM | POA: Diagnosis not present

## 2016-04-11 DIAGNOSIS — I4891 Unspecified atrial fibrillation: Secondary | ICD-10-CM | POA: Diagnosis not present

## 2016-04-13 NOTE — Progress Notes (Signed)
Patient ID: Andrew Perkins, male   DOB: [birth year], 66 y.o.   MRN: [id number]     Patient ID: Andrew Perkins, male   DOB: [birth year], 66 y.o.   MRN: [id number]   Date:  04/25/2016   PCP:  Farris Has, MD  Cardiologist:  Dr Eden Emms Primary Electrophysiologist: Hillis Range   Chief Complaint  Patient presents with  . Atrial Fibrillation     History of Present Illness: Andrew Perkins is a 66 y.o. male who presents today for f/u atrial arrhythmias    The patient reports initially being diagnosed with atrial fibrillation 2/15 after presenting with tachypalpitations.  In retrospect, he thinks that he has had afib for 4-5 years.  Episodes were initially short, lasting several minutes.  At that time, he attributed them to "anxiety".  He developed sustained tachycardia 2/15 and he presented to Dr Vincente Liberty office with documented afib on ekg.  He was placed on eliquis and metoprolol.  He reports increasing frequency and duration of atrial fibrillation.  Several months ago, he was given flecainide as a "pill in pocket" approach.  He has required flecainide several times since then.  He had an event monitor placed 2/16 which has documented a more regular narrow complex SVT.  He has not tried daily AAD therapy though  He does take metoprolol twice daily.  He has very frequent palpitations which he finds worrisome.  He saw Dr. Johney Frame in consultation 06/02/14  and was given options to treat afib. He wanted to pursue AAD and avoid ablation at this time. He was started on flecainide 100 mg bid . Remains on DOAC with chadsvasc score of 3.  F/U ETT done 07/02/14 with no QRS widening, arrhythmia or ischemia  No palpitations Some exertional dyspnea mostly going up hills which is chronic   Seen by afib clinic 04/10/16 with no issues and flecainide continued   Going to [place] for [place] latter in month Asking about ED medicine Since he has no CAD I told him it was fine to take    Past Medical History:  Diagnosis  Date  . Anemia    a. Mild per pt - previously on iron but did not tolerate.  . Atrial fibrillation (HCC)    a. Dx 03/2013 (but ~3 yr of palps). Spont converted to NSR.  . Diabetes mellitus without complication (HCC)    borderline  . Erectile dysfunction   . Former consumption of alcohol    a. Abstinent as of 03/2013.  [name] Hx of echocardiogram    Echo (2/16):  Mod focal basal and mild concentric LVH, vigorous LVF, EF 65-70%, no RWMA, Gr 1 DD, trivial AI, severe LAE  . Hypertension   . Left atrial enlargement   . SVT (supraventricular tachycardia) (HCC)    Past Surgical History:  Procedure Laterality Date  . CIRCUMCISION     at 65yrs old  . TONSILLECTOMY       Current Outpatient Prescriptions  Medication Sig Dispense Refill  . apixaban (ELIQUIS) 5 MG TABS tablet Take 1 tablet (5 mg total) by mouth 2 (two) times daily. 180 tablet 2  . atorvastatin (LIPITOR) 20 MG tablet TAKE 1 TABLET BY MOUTH  DAILY AT 6 PM. 90 tablet 3  . diclofenac sodium (VOLTAREN) 1 % GEL Apply 1 application topically daily as needed for pain.    . ferrous sulfate 325 (65 FE) MG tablet Take 325 mg by mouth daily with breakfast.    . flecainide (TAMBOCOR) 100 MG tablet Take 1  tablet (100 mg total) by mouth 2 (two) times daily. 180 tablet 2  . losartan (COZAAR) 100 MG tablet Take 100 mg by mouth daily.    . Melatonin 5 MG TABS Take 1 tablet by mouth at bedtime as needed (sleep).     . Omega-3 Fatty Acids (FISH OIL PO) Take 1 capsule by mouth daily.    [name]. omeprazole (PRILOSEC) 40 MG capsule Take 40 mg by mouth daily.    . psyllium (HYDROCIL/METAMUCIL) 95 % PACK Take 1 packet by mouth daily.    . metoprolol tartrate (LOPRESSOR) 25 MG tablet Take 1 tablet (25 mg total) by mouth 2 (two) times daily. 180 tablet 3   No current facility-administered medications for this visit.     Allergies:   Penicillins   Social History:  The patient  reports that he has never smoked. He has never used smokeless tobacco. He reports  that he does not drink alcohol or use drugs.   Family History:  The patient's  family history includes Asthma in his brother; Diabetes Mellitus II in his brother.    ROS:  Please see the history of present illness.   All other systems are reviewed and negative.    PHYSICAL EXAM: VS:  BP 114/72   Pulse (!) 58   Ht 6\' 3"  (1.905 m)   Wt 256 lb 6.4 oz (116.3 kg)   SpO2 98%   BMI 32.05 kg/m  , BMI Body mass index is 32.05 kg/m. GEN: overweight in no acute distress  HEENT: normal  Neck: no JVD, carotid bruits, or masses Cardiac: RRR; no murmurs, rubs, or gallops,no edema  Respiratory:  clear to auscultation bilaterally, normal work of breathing GI: soft, nontender, nondistended, + BS MS: no deformity or atrophy  Skin: warm and dry  Neuro:  Strength and sensation are intact Psych: euthymic mood, full affect  EKG:  06/16/14  sinus rhythm 61 bpm, PR 194, QRS 102, Qtc 408 msec   Recent Labs: No results found for requested labs within last 8760 hours.    Lipid Panel  No results found for: CHOL, TRIG, HDL, CHOLHDL, VLDL, LDLCALC, LDLDIRECT   Wt Readings from Last 3 Encounters:  04/25/16 256 lb 6.4 oz (116.3 kg)  04/07/16 257 lb (116.6 kg)  12/08/15 254 lb 6.4 oz (115.4 kg)      Other studies Reviewed: Additional studies/ records that were reviewed today include echo, labs, prior EKG's and Dr. Jenel LucksAllred's mote.    ASSESSMENT AND PLAN:  1.  Paroxysmal atrial fibrillation/ SVT The patient has clearly documented atrial fibrillation.  He has  worn an event monitor which reveals SVT at 150 bpm which could be atrial flutter or a short RP arrhythmia. Continue flecainide 100 mg bid/metoprolol 25 mg bid. Continue eliquis for chads2vasc score of at least 3.  Should he fail medical therapy with flecainide then he may be more willing to consider ablation.   2. HTN Stable No change required today  3. Overweight Body mass index is 32.05 kg/m. Weight loss is advised  4. ED:  Ok to  use vasodilators but will need primary to prescribe   Follow with afib clinic in 6 months and me I na year    Signed, Charlton HawsPeter Elleen Coulibaly, MD  04/25/2016 9:01 AM

## 2016-04-14 ENCOUNTER — Encounter: Payer: Self-pay | Admitting: Cardiovascular Disease

## 2016-04-25 ENCOUNTER — Ambulatory Visit (INDEPENDENT_AMBULATORY_CARE_PROVIDER_SITE_OTHER): Payer: PPO | Admitting: Cardiovascular Disease

## 2016-04-25 ENCOUNTER — Encounter: Payer: Self-pay | Admitting: Cardiovascular Disease

## 2016-04-25 VITALS — BP 114/72 | HR 58 | Ht 75.0 in | Wt 256.4 lb

## 2016-04-25 DIAGNOSIS — I480 Paroxysmal atrial fibrillation: Secondary | ICD-10-CM | POA: Diagnosis not present

## 2016-04-25 DIAGNOSIS — I10 Essential (primary) hypertension: Secondary | ICD-10-CM | POA: Diagnosis not present

## 2016-04-25 MED ORDER — METOPROLOL TARTRATE 25 MG PO TABS
25.0000 mg | ORAL_TABLET | Freq: Two times a day (BID) | ORAL | 3 refills | Status: DC
Start: 2016-04-25 — End: 2017-05-10

## 2016-04-25 NOTE — Patient Instructions (Signed)

## 2016-04-28 DIAGNOSIS — D509 Iron deficiency anemia, unspecified: Secondary | ICD-10-CM | POA: Diagnosis not present

## 2016-06-10 DIAGNOSIS — H524 Presbyopia: Secondary | ICD-10-CM | POA: Diagnosis not present

## 2016-06-10 DIAGNOSIS — H5203 Hypermetropia, bilateral: Secondary | ICD-10-CM | POA: Diagnosis not present

## 2016-06-16 DIAGNOSIS — D509 Iron deficiency anemia, unspecified: Secondary | ICD-10-CM | POA: Diagnosis not present

## 2016-09-07 ENCOUNTER — Ambulatory Visit (HOSPITAL_COMMUNITY)
Admission: RE | Admit: 2016-09-07 | Discharge: 2016-09-07 | Disposition: A | Discharge status: Home / Self Care | Payer: PPO | Source: Ambulatory Visit | Attending: Nurse Practitioner | Admitting: Nurse Practitioner

## 2016-09-07 ENCOUNTER — Encounter (HOSPITAL_COMMUNITY): Payer: Self-pay | Admitting: Nurse Practitioner

## 2016-09-07 VITALS — BP 132/78 | HR 57 | Ht 75.0 in | Wt 254.0 lb

## 2016-09-07 DIAGNOSIS — Z7901 Long term (current) use of anticoagulants: Secondary | ICD-10-CM | POA: Insufficient documentation

## 2016-09-07 DIAGNOSIS — Z6831 Body mass index (BMI) 31.0-31.9, adult: Secondary | ICD-10-CM | POA: Diagnosis not present

## 2016-09-07 DIAGNOSIS — Z79899 Other long term (current) drug therapy: Secondary | ICD-10-CM | POA: Diagnosis not present

## 2016-09-07 DIAGNOSIS — Z880 Allergy status to penicillin: Secondary | ICD-10-CM | POA: Insufficient documentation

## 2016-09-07 DIAGNOSIS — I480 Paroxysmal atrial fibrillation: Secondary | ICD-10-CM | POA: Diagnosis not present

## 2016-09-07 DIAGNOSIS — I471 Supraventricular tachycardia: Secondary | ICD-10-CM | POA: Diagnosis not present

## 2016-09-07 DIAGNOSIS — I119 Hypertensive heart disease without heart failure: Secondary | ICD-10-CM | POA: Insufficient documentation

## 2016-09-07 DIAGNOSIS — I4891 Unspecified atrial fibrillation: Secondary | ICD-10-CM | POA: Diagnosis present

## 2016-09-07 DIAGNOSIS — E663 Overweight: Secondary | ICD-10-CM | POA: Diagnosis not present

## 2016-09-07 DIAGNOSIS — E119 Type 2 diabetes mellitus without complications: Secondary | ICD-10-CM | POA: Diagnosis not present

## 2016-09-07 NOTE — Progress Notes (Signed)
Patient ID: Andrew Perkins, male   DOB: Feb 24, 1950, 66 y.o.   MRN: [id number]     Patient ID: Andrew Perkins, male   DOB: 1950-12-26, 66 y.o.   MRN: [id number]   Date:  09/07/2016   PCP:  Farris Has, MD  Cardiologist:  Dr Eden Emms Primary Electrophysiologist: Hillis Range   Chief Complaint  Patient presents with  . Atrial Fibrillation     History of Present Illness: Andrew Perkins is a 66 y.o. male who presents today for f/u in the afib clinic.   The patient reports initially being diagnosed with atrial fibrillation 2/15 after presenting with tachypalpitations.  In retrospect, he thinks that he has had afib for 4-5 years.  Episodes were initially short, lasting several minutes.  At that time, he attributed them to "anxiety".  He developed sustained tachycardia 2/15 and he presented to Dr Vincente Liberty office with documented afib on ekg.  He was placed on eliquis and metoprolol.  He reports increasing frequency and duration of atrial fibrillation.  Several months ago, he was given flecainide as a "pill in pocket" approach.  He has required flecainide several times since then.  He had an event monitor placed 2/16 which has documented a more regular narrow complex SVT.  He has not tried daily AAD therapy though  He does take metoprolol twice daily.  He has very frequent palpitations which he finds worrisome.  He saw Dr. Johney Frame in consultation 4/16 and was given options to treat afib. He wanted to pursue AAD and avoid ablation.  He was started on flecainide 100 mg bid . He appears to be tolerating without adverse effects and most importantly has very little afib burden on drug. He noticed afib for one hour last Sunday and this is the most afib that he has noticed.  He reports  negative sleep study. He does try to walk daily. Remains on DOAC with chadsvasc score of 3. He states that he does have mild anemia and takes iron. He has had a colonoscopy in the last year and did have polys which were removed.  F/u clinic  07/1515, continue on flecainide and is doing will with litle afib burden. He report that he had 3 episode in early April, longest epiosde was 3 hours at v rate of 102 bpm, next episode 3 days later at 115 bpm, lasted one hour, last epiosde lasted 5 mins, at 99 bpm. Has not any further episodes since that 8th of April. He is still satisfied with afib management. He can now continue with what he is doing in afib vrs getting lightheaded and having to lay down.  Continues on eliquis, no bleeding issues.  F/u in afib clinic 12/08/15 and reports small afib burden. He is till happy with afib management. He is walking on a regular basis. Afib episodes can last 5 mins to an hour. No bleeding issues with eliquis.  F/u in afib clinc 04/07/16. He reports increase in afib burden over the last week because he traveled out of town and was out of his usual routine with diet and sleeping. He still is happy with flecainide and wants to continue with drug.   Return to afib clinic 7/18. Continues with flecainide and reports very little afib burden. Feels well. Continues with eliquis 5 mg bid, no bleeding issues currently.  Today, he denies symptoms of chest pain, shortness of breath, orthopnea, PND, lower extremity edema, claudication, dizziness, presyncope, syncope, bleeding, or neurologic sequela. The patient is tolerating medications without difficulties and is  otherwise without complaint today.    Past Medical History:  Diagnosis Date  . Anemia    a. Mild per pt - previously on iron but did not tolerate.  . Atrial fibrillation (HCC)    a. Dx 03/2013 (but ~3 yr of palps). Spont converted to NSR.  . Diabetes mellitus without complication (HCC)    borderline  . Erectile dysfunction   . Former consumption of alcohol    a. Abstinent as of 03/2013.  [name] Hx of echocardiogram    Echo (2/16):  Mod focal basal and mild concentric LVH, vigorous LVF, EF 65-70%, no RWMA, Gr 1 DD, trivial AI, severe LAE  . Hypertension   .  Left atrial enlargement   . SVT (supraventricular tachycardia) (HCC)    Past Surgical History:  Procedure Laterality Date  . CIRCUMCISION     at 66yrs old  . TONSILLECTOMY       Current Outpatient Prescriptions  Medication Sig Dispense Refill  . apixaban (ELIQUIS) 5 MG TABS tablet Take 1 tablet (5 mg total) by mouth 2 (two) times daily. 180 tablet 2  . atorvastatin (LIPITOR) 20 MG tablet TAKE 1 TABLET BY MOUTH  DAILY AT 6 PM. 90 tablet 3  . diclofenac sodium (VOLTAREN) 1 % GEL Apply 1 application topically daily as needed for pain.    . ferrous sulfate 325 (65 FE) MG tablet Take 325 mg by mouth daily with breakfast.    . flecainide (TAMBOCOR) 100 MG tablet Take 1 tablet (100 mg total) by mouth 2 (two) times daily. 180 tablet 2  . losartan (COZAAR) 100 MG tablet Take 100 mg by mouth daily.    . Melatonin 5 MG TABS Take 1 tablet by mouth at bedtime as needed (sleep).     . metoprolol tartrate (LOPRESSOR) 25 MG tablet Take 1 tablet (25 mg total) by mouth 2 (two) times daily. 180 tablet 3  . psyllium (HYDROCIL/METAMUCIL) 95 % PACK Take 1 packet by mouth daily.    . Omega-3 Fatty Acids (FISH OIL PO) Take 1 capsule by mouth daily.     No current facility-administered medications for this encounter.     Allergies:   Penicillins   Social History:  The patient  reports that he has never smoked. He has never used smokeless tobacco. He reports that he does not drink alcohol or use drugs.   Family History:  The patient's  family history includes Asthma in his brother; Diabetes Mellitus II in his brother.    ROS:  Please see the history of present illness.   All other systems are reviewed and negative.    PHYSICAL EXAM: VS:  BP 132/78 (BP Location: Left Arm, Patient Position: Sitting, Cuff Size: Large)   Pulse (!) 57   Ht 6\' 3"  (1.905 m)   Wt 254 lb (115.2 kg)   BMI 31.75 kg/m  , BMI Body mass index is 31.75 kg/m. GEN: overweight in no acute distress  HEENT: normal  Neck: no JVD,  carotid bruits, or masses Cardiac: RRR; no murmurs, rubs, or gallops,no edema  Respiratory:  clear to auscultation bilaterally, normal work of breathing GI: soft, nontender, nondistended, + BS MS: no deformity or atrophy  Skin: warm and dry  Neuro:  Strength and sensation are intact Psych: euthymic mood, full affect  EKG:  EKG is ordered today. The ekg ordered today shows sinus brady at 57 bpm, pr int 166 ms, qrs int , qtc 438 ms Epic records reviewed    Wt Readings  from Last 3 Encounters:  09/07/16 254 lb (115.2 kg)  04/25/16 256 lb 6.4 oz (116.3 kg)  04/07/16 257 lb (116.6 kg)       ASSESSMENT AND PLAN:  1.  Paroxysmal atrial fibrillation/ SVT Continue flecainide 100 mg bid/metoprolol 25 mg bid. Continue eliquis for chads2vasc score of at least 3.  Should he fail medical therapy with flecainide then he may be more willing to consider ablation.   2. HTN Stable No change required today  3. Overweight Body mass index is 31.75 kg/m. Weight loss is advised Continue regular exercise   F/u in afib clinic in one year F/u with Dr. Eden EmmsNishan in February as per recall    Elvina Sidleonna C. Matthew Folksarroll, ANP-C [facility] [address] [place], [place] [address] [phone]

## 2016-10-13 ENCOUNTER — Other Ambulatory Visit: Payer: PPO

## 2016-10-13 ENCOUNTER — Other Ambulatory Visit: Payer: Self-pay | Admitting: Family Medicine

## 2016-10-13 DIAGNOSIS — R319 Hematuria, unspecified: Secondary | ICD-10-CM

## 2016-10-14 ENCOUNTER — Ambulatory Visit
Admission: RE | Admit: 2016-10-14 | Discharge: 2016-10-14 | Disposition: A | Discharge status: Home / Self Care | Payer: PPO | Source: Ambulatory Visit | Attending: Family Medicine | Admitting: Family Medicine

## 2016-10-14 DIAGNOSIS — R319 Hematuria, unspecified: Secondary | ICD-10-CM

## 2016-10-14 DIAGNOSIS — R310 Gross hematuria: Secondary | ICD-10-CM | POA: Diagnosis not present

## 2016-10-14 MED ORDER — IOPAMIDOL (ISOVUE-300) INJECTION 61%
125.0000 mL | Freq: Once | INTRAVENOUS | Status: AC | PRN
Start: 2016-10-14 — End: 2016-10-14
  Administered 2016-10-14: 125 mL via INTRAVENOUS

## 2016-10-17 DIAGNOSIS — R319 Hematuria, unspecified: Secondary | ICD-10-CM | POA: Diagnosis not present

## 2016-10-17 DIAGNOSIS — R7309 Other abnormal glucose: Secondary | ICD-10-CM | POA: Diagnosis not present

## 2016-10-17 DIAGNOSIS — E785 Hyperlipidemia, unspecified: Secondary | ICD-10-CM | POA: Diagnosis not present

## 2016-10-17 DIAGNOSIS — Z125 Encounter for screening for malignant neoplasm of prostate: Secondary | ICD-10-CM | POA: Diagnosis not present

## 2016-10-17 DIAGNOSIS — Z136 Encounter for screening for cardiovascular disorders: Secondary | ICD-10-CM | POA: Diagnosis not present

## 2016-10-17 DIAGNOSIS — I10 Essential (primary) hypertension: Secondary | ICD-10-CM | POA: Diagnosis not present

## 2016-10-17 DIAGNOSIS — Z0000 Encounter for general adult medical examination without abnormal findings: Secondary | ICD-10-CM | POA: Diagnosis not present

## 2016-10-17 DIAGNOSIS — I4891 Unspecified atrial fibrillation: Secondary | ICD-10-CM | POA: Diagnosis not present

## 2016-11-02 DIAGNOSIS — R310 Gross hematuria: Secondary | ICD-10-CM | POA: Diagnosis not present

## 2016-11-02 DIAGNOSIS — N5201 Erectile dysfunction due to arterial insufficiency: Secondary | ICD-10-CM | POA: Diagnosis not present

## 2016-11-15 DIAGNOSIS — N5201 Erectile dysfunction due to arterial insufficiency: Secondary | ICD-10-CM | POA: Diagnosis not present

## 2016-11-15 DIAGNOSIS — R310 Gross hematuria: Secondary | ICD-10-CM | POA: Diagnosis not present

## 2016-12-27 ENCOUNTER — Telehealth: Payer: Self-pay | Admitting: Cardiovascular Disease

## 2016-12-27 NOTE — Telephone Encounter (Signed)
New Message ° °Patient calling the office for samples of medication: ° ° °1.  What medication and dosage are you requesting samples for? Eliquis 5mg  ° °2.  Are you currently out of this medication? Yes  ° ° ° ° °

## 2016-12-27 NOTE — Telephone Encounter (Signed)
Follow up     Pt states he is returing call.

## 2016-12-27 NOTE — Telephone Encounter (Signed)
Spoke with patient and he stated that he is in the donut hole. He is aware that I can place two weeks of samples at the front desk. I mentioned patient assistance but he did not express an interest. I have made him aware that we are unable to supply enough samples to last him until the beginning of the year. Patient verbalized his understanding and appreciation for the samples.

## 2017-01-16 ENCOUNTER — Emergency Department (HOSPITAL_COMMUNITY)
Admission: EM | Admit: 2017-01-16 | Discharge: 2017-01-16 | Disposition: A | Discharge status: Home / Self Care | Payer: PPO | Attending: Emergency Medicine | Admitting: Emergency Medicine

## 2017-01-16 ENCOUNTER — Other Ambulatory Visit: Payer: Self-pay

## 2017-01-16 ENCOUNTER — Encounter (HOSPITAL_COMMUNITY): Payer: Self-pay | Admitting: Emergency Medicine

## 2017-01-16 DIAGNOSIS — R9431 Abnormal electrocardiogram [ECG] [EKG]: Secondary | ICD-10-CM | POA: Diagnosis not present

## 2017-01-16 DIAGNOSIS — Z79899 Other long term (current) drug therapy: Secondary | ICD-10-CM | POA: Diagnosis not present

## 2017-01-16 DIAGNOSIS — Z98818 Other dental procedure status: Secondary | ICD-10-CM | POA: Insufficient documentation

## 2017-01-16 DIAGNOSIS — K1379 Other lesions of oral mucosa: Secondary | ICD-10-CM | POA: Diagnosis not present

## 2017-01-16 DIAGNOSIS — K068 Other specified disorders of gingiva and edentulous alveolar ridge: Secondary | ICD-10-CM | POA: Diagnosis not present

## 2017-01-16 DIAGNOSIS — I10 Essential (primary) hypertension: Secondary | ICD-10-CM | POA: Diagnosis not present

## 2017-01-16 DIAGNOSIS — Z7901 Long term (current) use of anticoagulants: Secondary | ICD-10-CM | POA: Diagnosis not present

## 2017-01-16 LAB — COMPREHENSIVE METABOLIC PANEL
ALT: 17 U/L (ref 17–63)
AST: 19 U/L (ref 15–41)
Albumin: 3.8 g/dL (ref 3.5–5.0)
Alkaline Phosphatase: 60 U/L (ref 38–126)
Anion gap: 7 (ref 5–15)
BUN: 19 mg/dL (ref 6–20)
CO2: 21 mmol/L — ABNORMAL LOW (ref 22–32)
Calcium: 8.9 mg/dL (ref 8.9–10.3)
Chloride: 111 mmol/L (ref 101–111)
Creatinine, Ser: 0.98 mg/dL (ref 0.61–1.24)
GFR calc Af Amer: >60 mL/min (ref >60)
GFR calc non Af Amer: >60 mL/min (ref >60)
Glucose, Bld: 114 mg/dL — ABNORMAL HIGH (ref 65–99)
Potassium: 3.6 mmol/L (ref 3.5–5.1)
Sodium: 139 mmol/L (ref 135–145)
Total Bilirubin: 0.5 mg/dL (ref 0.3–1.2)
Total Protein: 7.4 g/dL (ref 6.5–8.1)

## 2017-01-16 LAB — CBC WITH DIFFERENTIAL/PLATELET
Basophils Absolute: 0 10*3/uL (ref 0.0–0.1)
Basophils Relative: 0 %
Eosinophils Absolute: 0.5 10*3/uL (ref 0.0–0.7)
Eosinophils Relative: 7 %
HCT: 40.3 % (ref 39.0–52.0)
Hemoglobin: 13.4 g/dL (ref 13.0–17.0)
Lymphocytes Relative: 35 %
Lymphs Abs: 2.3 10*3/uL (ref 0.7–4.0)
MCH: 29 pg (ref 26.0–34.0)
MCHC: 33.3 g/dL (ref 30.0–36.0)
MCV: 87.2 fL (ref 78.0–100.0)
Monocytes Absolute: 0.8 10*3/uL (ref 0.1–1.0)
Monocytes Relative: 12 %
Neutro Abs: 3 10*3/uL (ref 1.7–7.7)
Neutrophils Relative %: 46 %
Platelets: 198 10*3/uL (ref 150–400)
RBC: 4.62 MIL/uL (ref 4.22–5.81)
RDW: 15.1 % (ref 11.5–15.5)
WBC: 6.7 10*3/uL (ref 4.0–10.5)

## 2017-01-16 MED ORDER — TRANEXAMIC ACID 1000 MG/10ML IV SOLN: 500.0000 mg | Freq: Once | INTRAVENOUS | Status: DC

## 2017-01-16 MED ORDER — TRANEXAMIC ACID 1000 MG/10ML IV SOLN
500.0000 mg | Freq: Once | INTRAVENOUS | Status: AC
  Administered 2017-01-16: 500 mg via TOPICAL
  Filled 2017-01-16: qty 10

## 2017-01-16 NOTE — Discharge Instructions (Signed)
Your bleeding was stopped today with tranexamic acid.   Blood work looked good-- labs on back for review. Would follow-up with your dentist and let them know about today's ED visit. Return here for any new/worsening symptoms.

## 2017-01-16 NOTE — ED Provider Notes (Signed)
Patient presented to the ER with bleeding from gums at site of recent dental implant.  He is on Eliquis.  No dizziness, weakness, palpitations, shortness of breath, passing out.  Face to face Exam: HEENT - PERRLA Lungs - CTAB Heart - RRR, no M/R/G Abd - S/NT/ND Neuro - alert, oriented x3  Plan: Initially had some slight oozing of blood, treated with tranexamic acid and direct pressure, bleeding has stopped.   Gilda CreasePollina, Christopher J, MD 01/16/17 [id number]

## 2017-01-16 NOTE — ED Provider Notes (Signed)
[facility] EMERGENCY DEPARTMENT Provider Note   CSN: [id number] Arrival date & time: 01/16/17  0356     History   Chief Complaint Chief Complaint  Patient presents with  . Coagulation Disorder    HPI Andrew Perkins is a 66 y.o. male.  The history is provided by the patient and medical records.     66 year old male with history of anemia, A. fib on Eliquis, diabetes, hypertension, presenting to the ED with bleeding from his gums.  Patient reports he had a dental work done on 01/04/2017 for a dental implant, but reports he is not bleeding from that area.  Reports he has been bleeding from his left upper gums since Saturday night (about 36 hours).  States blood does not seem to be gushing, but slowly losing.  States he plans to follow-up with his doctor this morning, however was concerned that he was losing too much blood and wanted to be evaluated.  He denies any feelings of dizziness or lightheadedness.  No feelings of syncope.  Vitals are stable.  Past Medical History:  Diagnosis Date  . Anemia    a. Mild per pt - previously on iron but did not tolerate.  . Atrial fibrillation (HCC)    a. Dx 03/2013 (but ~3 yr of palps). Spont converted to NSR.  . Diabetes mellitus without complication (HCC)    borderline  . Erectile dysfunction   . Former consumption of alcohol    a. Abstinent as of 03/2013.  [name] Hx of echocardiogram    Echo (2/16):  Mod focal basal and mild concentric LVH, vigorous LVF, EF 65-70%, no RWMA, Gr 1 DD, trivial AI, severe LAE  . Hypertension   . Left atrial enlargement   . SVT (supraventricular tachycardia) [facility])     Patient Active Problem List   Diagnosis Date Noted  . Overweight 06/02/2014  . Chest pain 04/30/2013  . Former consumption of alcohol   . Atrial fibrillation (HCC) 04/17/2013  . Atrial fibrillation with RVR (HCC) 04/17/2013  . HTN (hypertension) 04/17/2013    Past Surgical History:  Procedure Laterality Date  . CIRCUMCISION      at 66yrs old  . TONSILLECTOMY         Home Medications    Prior to Admission medications   Medication Sig Start Date End Date Taking? Authorizing Provider  apixaban (ELIQUIS) 5 MG TABS tablet Take 1 tablet (5 mg total) by mouth 2 (two) times daily. 04/07/16   Newman Nip, NP  atorvastatin (LIPITOR) 20 MG tablet TAKE 1 TABLET BY MOUTH  DAILY AT 6 PM. 08/19/15   Wendall Stade, MD  diclofenac sodium (VOLTAREN) 1 % GEL Apply 1 application topically daily as needed for pain. 03/01/16   [provider]  ferrous sulfate 325 (65 FE) MG tablet Take 325 mg by mouth daily with breakfast.    [provider]  flecainide (TAMBOCOR) 100 MG tablet Take 1 tablet (100 mg total) by mouth 2 (two) times daily. 04/07/16   Newman Nip, NP  losartan (COZAAR) 100 MG tablet Take 100 mg by mouth daily.    [provider]  Melatonin 5 MG TABS Take 1 tablet by mouth at bedtime as needed (sleep).     [provider]  metoprolol tartrate (LOPRESSOR) 25 MG tablet Take 1 tablet (25 mg total) by mouth 2 (two) times daily. 04/25/16   Wendall Stade, MD  Omega-3 Fatty Acids (FISH OIL PO) Take 1 capsule by mouth  daily.    [provider]  psyllium (HYDROCIL/METAMUCIL) 95 % PACK Take 1 packet by mouth daily.    [provider]    Family History Family History  Problem Relation Age of Onset  . Diabetes Mellitus II Brother   . Asthma Brother   . CAD Neg Hx   . Stroke Neg Hx   . Heart attack Neg Hx     Social History Social History   Tobacco Use  . Smoking status: Never Smoker  . Smokeless tobacco: Never Used  Substance Use Topics  . Alcohol use: No    Alcohol/week: 0.0 oz  . Drug use: No     Allergies   Penicillins   Review of Systems Review of Systems  Hematological: Bruises/bleeds easily.  All other systems reviewed and are negative.    Physical Exam Updated Vital Signs BP (!) 158/95   Pulse 66   Temp 97.8 F (36.6 C) (Oral)    Resp 17   SpO2 97%   Physical Exam  Constitutional: He is oriented to person, place, and time. He appears well-developed and well-nourished.  HENT:  Head: Normocephalic and atraumatic.  Mouth/Throat: Oropharynx is clear and moist.  Teeth largely in fair dentition, right upper central incisor is missing, very minor, slow bleed from gums above left upper canine, clot present in this area; tooth is not loose; no signs of trauma, handling secretions appropriately, no trismus, no facial or neck swelling, normal phonation without stridor  Eyes: Conjunctivae and EOM are normal. Pupils are equal, round, and reactive to light.  Neck: Normal range of motion.  Cardiovascular: Normal rate, regular rhythm and normal heart sounds.  Pulmonary/Chest: Effort normal and breath sounds normal.  Abdominal: Soft. Bowel sounds are normal.  Musculoskeletal: Normal range of motion.  Neurological: He is alert and oriented to person, place, and time.  Skin: Skin is warm and dry.  Psychiatric: He has a normal mood and affect.  Nursing note and vitals reviewed.    ED Treatments / Results  Labs (all labs ordered are listed, but only abnormal results are displayed) Labs Reviewed  COMPREHENSIVE METABOLIC PANEL - Abnormal; Notable for the following components:      Result Value   CO2 21 (*)    Glucose, Bld 114 (*)    All other components within normal limits  CBC WITH DIFFERENTIAL/PLATELET    EKG  EKG Interpretation  Date/Time:  Monday January 16 2017 04:15:06 EST Ventricular Rate:  65 PR Interval:  196 QRS Duration: 104 QT Interval:  420 QTC Calculation: 436 R Axis:   -5 Text Interpretation:  Normal sinus rhythm Moderate voltage criteria for LVH, may be normal variant Borderline ECG When compared with ECG of 08/21/2016, No significant change was found Confirmed by Dione BoozeGlick, David ([id number]) on 01/16/2017 4:21:54 AM       Radiology No results found.  Procedures Procedures (including critical care  time)  Medications Ordered in ED Medications  tranexamic acid (CYKLOKAPRON) injection 500 mg (500 mg Topical Given 01/16/17 0451)     Initial Impression / Assessment and Plan / ED Course  I have reviewed the triage vital signs and the nursing notes.  Pertinent labs & imaging results that were available during my care of the patient were reviewed by me and considered in my medical decision making (see chart for details).  66 year old male here with bleeding from gums.  Recent dental procedure for dental implant.  On exam has bleeding from gums of left upper canine.  There is clot present, slow ooze from beneath.  He is on Eliquis for A. fib.  Will apply TXA.  Screening labs sent.  Bleeding stopped with application of TXA topically.  Observed for additional 30 mins without recurrent of bleed.  VSS.  Blood work reassuring.  Can safely d/c.  Encouraged to follow-up with dentist.  Discussed plan with patient, he acknowledged understanding and agreed with plan of care.  Return precautions given for new or worsening symptoms.  Final Clinical Impressions(s) / ED Diagnoses   Final diagnoses:  Bleeding from mouth    ED Discharge Orders    None       Garlon HatchetSanders, Jadia Capers M, PA-C 01/16/17 [id number]    Gilda CreasePollina, Christopher J, MD 01/16/17 [phone]

## 2017-01-16 NOTE — ED Triage Notes (Signed)
Patient here with bleeding of his gums.  He states that it has been going on since Saturday.  He had an implant in his gum on the 14th.  He is on Eliquis.  He states that the bleeding has gotten worse since Saturday.

## 2017-01-20 ENCOUNTER — Other Ambulatory Visit (HOSPITAL_COMMUNITY): Payer: Self-pay | Admitting: Nurse Practitioner

## 2017-02-22 ENCOUNTER — Other Ambulatory Visit (HOSPITAL_COMMUNITY): Payer: Self-pay | Admitting: *Deleted

## 2017-02-22 MED ORDER — APIXABAN 5 MG PO TABS: 5.0000 mg | ORAL_TABLET | Freq: Two times a day (BID) | ORAL | 2 refills | Status: DC

## 2017-02-24 DIAGNOSIS — J209 Acute bronchitis, unspecified: Secondary | ICD-10-CM | POA: Diagnosis not present

## 2017-04-20 DIAGNOSIS — I10 Essential (primary) hypertension: Secondary | ICD-10-CM | POA: Diagnosis not present

## 2017-04-20 DIAGNOSIS — E785 Hyperlipidemia, unspecified: Secondary | ICD-10-CM | POA: Diagnosis not present

## 2017-04-20 DIAGNOSIS — M542 Cervicalgia: Secondary | ICD-10-CM | POA: Diagnosis not present

## 2017-04-20 DIAGNOSIS — R7309 Other abnormal glucose: Secondary | ICD-10-CM | POA: Diagnosis not present

## 2017-04-20 DIAGNOSIS — I4891 Unspecified atrial fibrillation: Secondary | ICD-10-CM | POA: Diagnosis not present

## 2017-04-24 ENCOUNTER — Ambulatory Visit: Payer: PPO | Admitting: Cardiovascular Disease

## 2017-04-24 ENCOUNTER — Encounter: Payer: Self-pay | Admitting: Cardiovascular Disease

## 2017-04-24 VITALS — BP 140/84 | HR 60 | Ht 75.0 in | Wt 260.0 lb

## 2017-04-24 DIAGNOSIS — I10 Essential (primary) hypertension: Secondary | ICD-10-CM | POA: Diagnosis not present

## 2017-04-24 DIAGNOSIS — I480 Paroxysmal atrial fibrillation: Secondary | ICD-10-CM

## 2017-04-24 NOTE — Patient Instructions (Addendum)
Medication Instructions:  Your physician recommends that you continue on your current medications as directed. Please refer to the Current Medication list given to you today.  Labwork: NONE  Testing/Procedures: NONE  Follow-Up: Your physician recommends that you schedule a follow-up appointment in: 6 months with [facility].   Your physician wants you to follow-up in: 12 months with Dr. Eden EmmsNishan. You will receive a reminder letter in the mail two months in advance. If you don't receive a letter, please call our office to schedule the follow-up appointment.   If you need a refill on your cardiac medications before your next appointment, please call your pharmacy.

## 2017-04-24 NOTE — Progress Notes (Signed)
Patient ID: Andrew Perkins, male   DOB: 1951-02-13, 67 y.o.   MRN: [id number]     Patient ID: Andrew Perkins, male   DOB: 1951-02-13, 67 y.o.   MRN: [id number]   Date:  04/24/2017   PCP:  Farris HasMorrow, Aaron, MD  Cardiologist:  Dr Eden EmmsNishan Primary Electrophysiologist: Hillis RangeAllred, James   Chief Complaint  Patient presents with  . Follow-up    PAF     History of Present Illness:  67 y.o. history of PAF. First noted 2015. Been on eliquis, metroprolol and flecainide with  Minimal afib burden Has seen Dr Johney FrameAllred and preferred not to have ablation. Also has narrow Complex SVT. CHADSVASC score 3. Last normal ETT 2016   Still with occasional break through Episodes don't last long and HR only in 90's so he does Not get dizzy now.   Prefers to stay on flecainide and not consider ablation still No missed doses of DOAC     Past Medical History:  Diagnosis Date  . Anemia    a. Mild per pt - previously on iron but did not tolerate.  . Atrial fibrillation (HCC)    a. Dx 03/2013 (but ~3 yr of palps). Spont converted to NSR.  . Diabetes mellitus without complication (HCC)    borderline  . Erectile dysfunction   . Former consumption of alcohol    a. Abstinent as of 03/2013.  [name]. Hx of echocardiogram    Echo (2/16):  Mod focal basal and mild concentric LVH, vigorous LVF, EF 65-70%, no RWMA, Gr 1 DD, trivial AI, severe LAE  . Hypertension   . Left atrial enlargement   . SVT (supraventricular tachycardia) (HCC)    Past Surgical History:  Procedure Laterality Date  . CIRCUMCISION     at [age]yrs old  . TONSILLECTOMY       Current Outpatient Medications  Medication Sig Dispense Refill  . apixaban (ELIQUIS) 5 MG TABS tablet Take 1 tablet (5 mg total) by mouth 2 (two) times daily. 180 tablet 2  . atorvastatin (LIPITOR) 20 MG tablet TAKE 1 TABLET BY MOUTH  DAILY AT 6 PM. 90 tablet 3  . diclofenac sodium (VOLTAREN) 1 % GEL Apply 1 application topically daily as needed for pain.    . ferrous sulfate 325 (65 FE)  MG tablet Take 325 mg by mouth daily with breakfast.    . flecainide (TAMBOCOR) 100 MG tablet Take 1 tablet by mouth twice a day 180 tablet 2  . losartan (COZAAR) 100 MG tablet Take 100 mg by mouth daily.    . Melatonin 5 MG TABS Take 1 tablet by mouth at bedtime as needed (sleep).     . metoprolol tartrate (LOPRESSOR) 25 MG tablet Take 1 tablet (25 mg total) by mouth 2 (two) times daily. 180 tablet 3  . Omega-3 Fatty Acids (FISH OIL PO) Take 1 capsule by mouth daily.    . psyllium (HYDROCIL/METAMUCIL) 95 % PACK Take 1 packet by mouth daily.     No current facility-administered medications for this visit.     Allergies:   Penicillins   Social History:  The patient  reports that  has never smoked. he has never used smokeless tobacco. He reports that he does not drink alcohol or use drugs.   Family History:  The patient's  family history includes Asthma in his brother; Diabetes Mellitus II in his brother.    ROS:  Please see the history of present illness.   All other systems are reviewed and negative.  PHYSICAL EXAM: VS:  BP 140/84   Pulse 60   Ht 6\' 3"  (1.905 m)   Wt 260 lb (117.9 kg)   BMI 32.50 kg/m  , BMI Body mass index is 32.5 kg/m. Affect appropriate Healthy:  appears stated age HEENT: normal Neck supple with no adenopathy JVP normal no bruits no thyromegaly Lungs clear with no wheezing and good diaphragmatic motion Heart:  S1/S2 no murmur, no rub, gallop or click PMI normal Abdomen: benighn, BS positve, no tenderness, no AAA no bruit.  No HSM or HJR Distal pulses intact with no bruits No edema Neuro non-focal Skin warm and dry No muscular weakness   EKG:  06/16/14  sinus rhythm 61 bpm, PR 194, QRS 102, Qtc 408 msec   Recent Labs: 01/16/2017: ALT 17; BUN 19; Creatinine, Ser 0.98; Hemoglobin 13.4; Platelets 198; Potassium 3.6; Sodium 139    Lipid Panel  No results found for: CHOL, TRIG, HDL, CHOLHDL, VLDL, LDLCALC, LDLDIRECT   Wt Readings from Last 3  Encounters:  04/24/17 260 lb (117.9 kg)  09/07/16 254 lb (115.2 kg)  04/25/16 256 lb 6.4 oz (116.3 kg)      Other studies Reviewed: Additional studies/ records that were reviewed today include echo, labs, prior EKG's and Dr. Jenel Lucks mote.    ASSESSMENT AND PLAN:  1.  Paroxysmal atrial fibrillation/ SVT continue beta blocker , flecainide  Continue eliquis for chads2vasc score of at least 3.  Should he fail medical therapy with flecainide then he may be more willing to consider ablation.   2. HTN  Well controlled.  Continue current medications and low sodium Dash type diet.    3. Overweight discussed low carb diet   4. ED:  Ok to use vasodilators but will need primary to prescribe   Follow with afib clinic in 6 months and me in a year    Signed, Charlton Haws, MD  04/24/2017 9:00 AM

## 2017-05-10 ENCOUNTER — Other Ambulatory Visit (HOSPITAL_COMMUNITY): Payer: Self-pay | Admitting: *Deleted

## 2017-05-10 MED ORDER — METOPROLOL TARTRATE 25 MG PO TABS: 25.0000 mg | ORAL_TABLET | Freq: Two times a day (BID) | ORAL | 3 refills | Status: DC

## 2017-05-10 MED ORDER — APIXABAN 5 MG PO TABS: 5.0000 mg | ORAL_TABLET | Freq: Two times a day (BID) | ORAL | 2 refills | Status: DC

## 2017-05-11 ENCOUNTER — Other Ambulatory Visit: Payer: Self-pay | Admitting: Cardiovascular Disease

## 2017-05-11 NOTE — Telephone Encounter (Signed)
Outpatient Medication Detail    Disp Refills Start End   metoprolol tartrate (LOPRESSOR) 25 MG tablet 180 tablet 3 05/10/2017    Sig - Route: Take 1 tablet (25 mg total) by mouth 2 (two) times daily. - Oral   Sent to pharmacy as: metoprolol tartrate (LOPRESSOR) 25 MG tablet   E-Prescribing Status: Receipt confirmed by pharmacy (05/10/2017 2:14 PM EDT)   Pharmacy   [facility] [place] - [phone]

## 2017-05-18 DIAGNOSIS — E785 Hyperlipidemia, unspecified: Secondary | ICD-10-CM | POA: Diagnosis not present

## 2017-05-18 DIAGNOSIS — R7309 Other abnormal glucose: Secondary | ICD-10-CM | POA: Diagnosis not present

## 2017-05-18 DIAGNOSIS — I4891 Unspecified atrial fibrillation: Secondary | ICD-10-CM | POA: Diagnosis not present

## 2017-05-18 DIAGNOSIS — I10 Essential (primary) hypertension: Secondary | ICD-10-CM | POA: Diagnosis not present

## 2017-06-27 ENCOUNTER — Ambulatory Visit (INDEPENDENT_AMBULATORY_CARE_PROVIDER_SITE_OTHER): Payer: PPO | Admitting: Neurology

## 2017-06-27 ENCOUNTER — Encounter: Payer: Self-pay | Admitting: Neurology

## 2017-06-27 DIAGNOSIS — M542 Cervicalgia: Secondary | ICD-10-CM | POA: Diagnosis not present

## 2017-06-27 DIAGNOSIS — G8929 Other chronic pain: Secondary | ICD-10-CM | POA: Diagnosis not present

## 2017-06-27 HISTORY — DX: Other chronic pain: G89.29

## 2017-06-27 MED ORDER — GABAPENTIN 300 MG PO CAPS: ORAL_CAPSULE | ORAL | 3 refills | Status: DC

## 2017-06-27 NOTE — Patient Instructions (Signed)
   We will start gabapentin 300 mg twice a day.  Neurontin (gabapentin) may result in drowsiness, ankle swelling, gait instability, or possibly dizziness. Please contact our office if significant side effects occur with this medication.  

## 2017-06-27 NOTE — Progress Notes (Signed)
Reason for visit: Neck pain  Referring physician: Dr. Marilynne Drivers [facility] is a 67 y.o. male  History of present illness:  Mr. Andrew Perkins is a 67 year old right-handed black male with a history of neck pain that has been present for about 1 year.  The patient attributes this neck pain to sleeping on a new pillow.  The patient has been seen through [facility], he does not remember the name of the doctor he saw.  He had x-rays of the neck and was told he had arthritis.  He was sent through physical therapy without much benefit, he was placed on a muscle relaxant, he cannot remember the name of the medication.  He indicates that the medication did not help and he stopped taking it.  The patient has had increased discomfort in the neck when sleeping on the left side.  He indicates that the neck pain is on both sides of the neck and goes up into the base of the skull.  He does note some neck stiffness.  More recently has had some improvement in pain, his ability to turn his head when backing up with his car has gotten better.  He does not stretch out on a regular basis but he does use a heating pad at times.  He reports no pain or numbness or tingling sensations down the arms or legs, he reports no weakness of the extremities.  He denies any significant back pain.  He does report some left hip pain at times.  He denies issues controlling the bowels or the bladder.  He currently is not taking any medications for the pain.  He is sent to this office for an evaluation.  Past Medical History:  Diagnosis Date  . Anemia    a. Mild per pt - previously on iron but did not tolerate.  . Atrial fibrillation (HCC)    a. Dx 03/2013 (but ~3 yr of palps). Spont converted to NSR.  [name] Chronic neck pain 06/27/2017  . Diabetes mellitus without complication (HCC)    borderline  . Erectile dysfunction   . Former consumption of alcohol    a. Abstinent as of 03/2013.  [name] Hx of echocardiogram    Echo (2/16):  Mod  focal basal and mild concentric LVH, vigorous LVF, EF 65-70%, no RWMA, Gr 1 DD, trivial AI, severe LAE  . Hypertension   . Left atrial enlargement   . SVT (supraventricular tachycardia) (HCC)     Past Surgical History:  Procedure Laterality Date  . CIRCUMCISION     at 67yrs old  . TONSILLECTOMY    . WISDOM TOOTH EXTRACTION      Family History  Problem Relation Age of Onset  . Diabetes Mellitus II Brother   . Asthma Brother   . CAD Neg Hx   . Stroke Neg Hx   . Heart attack Neg Hx     Social history:  reports that he has never smoked. He has never used smokeless tobacco. He reports that he does not drink alcohol or use drugs.  Medications:  Prior to Admission medications   Medication Sig Start Date End Date Taking? Authorizing Provider  apixaban (ELIQUIS) 5 MG TABS tablet Take 1 tablet (5 mg total) by mouth 2 (two) times daily. 05/10/17  Yes Newman Nip, NP  atorvastatin (LIPITOR) 20 MG tablet TAKE 1 TABLET BY MOUTH  DAILY AT 6 PM. 08/19/15  Yes Wendall Stade, MD  diclofenac sodium (VOLTAREN) 1 % GEL Apply  1 application topically daily as needed for pain. 03/01/16  Yes [provider]  ferrous sulfate 325 (65 FE) MG tablet Take 325 mg by mouth daily with breakfast.   Yes [provider]  flecainide (TAMBOCOR) 100 MG tablet Take 1 tablet by mouth twice a day 01/20/17  Yes Newman Nip, NP  losartan (COZAAR) 100 MG tablet Take 100 mg by mouth daily.   Yes [provider]  Melatonin 5 MG TABS Take 1 tablet by mouth at bedtime as needed (sleep).    Yes [provider]  metoprolol tartrate (LOPRESSOR) 25 MG tablet Take 1 tablet (25 mg total) by mouth 2 (two) times daily. 05/10/17  Yes Newman Nip, NP  Omega-3 Fatty Acids (FISH OIL PO) Take 1 capsule by mouth daily.   Yes [provider]  psyllium (HYDROCIL/METAMUCIL) 95 % PACK Take 1 packet by mouth daily.   Yes [provider]  gabapentin (NEURONTIN) 300 MG capsule One  capsule twice a day for 2 weeks, then take one in the morning and 2 in the evening 06/27/17   York Spaniel, MD      Allergies  Allergen Reactions  . Penicillins Other (See Comments)    syncope    ROS:  Out of a complete 14 system review of symptoms, the patient complains only of the following symptoms, and all other reviewed systems are negative.  Palpitations of the heart Shortness of breath Joint pain Impotence Snoring  Blood pressure (!) 150/79, pulse (!) 57, height  (1.905 m), weight 260 lb (117.9 kg).  Physical Exam  General: The patient is alert and cooperative at the time of the examination.  Eyes: Pupils are equal, round, and reactive to light. Discs are flat bilaterally.  Neck: The neck is supple, no carotid bruits are noted.  Respiratory: The respiratory examination is clear.  Cardiovascular: The cardiovascular examination reveals a regular rate and rhythm, no obvious murmurs or rubs are noted.  Neuromuscular: Range move the cervical spine lacks only about 10 degrees of full lateral rotation of the cervical spine bilaterally.  Skin: Extremities are without significant edema.  Neurologic Exam  Mental status: The patient is alert and oriented x 3 at the time of the examination. The patient has apparent normal recent and remote memory, with an apparently normal attention span and concentration ability.  Cranial nerves: Facial symmetry is not present.  There is some depression of the left nasolabial fold.  There is good sensation of the face to pinprick and soft touch bilaterally. The strength of the facial muscles and the muscles to head turning and shoulder shrug are normal bilaterally. Speech is well enunciated, no aphasia or dysarthria is noted. Extraocular movements are full. Visual fields are full. The tongue is midline, and the patient has symmetric elevation of the soft palate. No obvious hearing deficits are noted.  Motor: The motor testing reveals 5  over 5 strength of all 4 extremities. Good symmetric motor tone is noted throughout.  Sensory: Sensory testing is intact to pinprick, soft touch, vibration sensation, and position sense on all 4 extremities. No evidence of extinction is noted.  Coordination: Cerebellar testing reveals good finger-nose-finger and heel-to-shin bilaterally.  Gait and station: Gait is normal. Tandem gait is normal. Romberg is negative. No drift is seen.  Reflexes: Deep tendon reflexes are symmetric, but are depressed bilaterally. Toes are downgoing bilaterally.   Assessment/Plan:  1.  Chronic neck pain  The patient reports no radicular type symptoms of  the neck, he likely does have cervical arthritis as he has been told previously through orthopedic surgery.  The patient is not on any medications for his discomfort, he will be placed on gabapentin in low-dose.  He will start at 300 mg twice daily for 2 weeks and go to 300 mg in the morning and 600 mg in the evening.  He is to stretch his neck out on a regular basis.  He will follow-up through this office in 4 months.  Marlan Palau MD 06/27/2017 9:52 AM  [facility] [address] [place], [place] [address]  Phone [phone] Fax [phone]

## 2017-09-01 DIAGNOSIS — L309 Dermatitis, unspecified: Secondary | ICD-10-CM | POA: Diagnosis not present

## 2017-09-01 DIAGNOSIS — M545 Low back pain: Secondary | ICD-10-CM | POA: Diagnosis not present

## 2017-09-01 DIAGNOSIS — I10 Essential (primary) hypertension: Secondary | ICD-10-CM | POA: Diagnosis not present

## 2017-10-12 ENCOUNTER — Other Ambulatory Visit (HOSPITAL_COMMUNITY): Payer: Self-pay | Admitting: *Deleted

## 2017-10-12 MED ORDER — FLECAINIDE ACETATE 100 MG PO TABS: 100.0000 mg | ORAL_TABLET | Freq: Two times a day (BID) | ORAL | 2 refills | Status: DC

## 2017-10-24 DIAGNOSIS — I4891 Unspecified atrial fibrillation: Secondary | ICD-10-CM | POA: Diagnosis not present

## 2017-10-24 DIAGNOSIS — Z0000 Encounter for general adult medical examination without abnormal findings: Secondary | ICD-10-CM | POA: Diagnosis not present

## 2017-10-24 DIAGNOSIS — Z125 Encounter for screening for malignant neoplasm of prostate: Secondary | ICD-10-CM | POA: Diagnosis not present

## 2017-10-24 DIAGNOSIS — I10 Essential (primary) hypertension: Secondary | ICD-10-CM | POA: Diagnosis not present

## 2017-10-24 DIAGNOSIS — R12 Heartburn: Secondary | ICD-10-CM | POA: Diagnosis not present

## 2017-10-24 DIAGNOSIS — E785 Hyperlipidemia, unspecified: Secondary | ICD-10-CM | POA: Diagnosis not present

## 2017-10-24 DIAGNOSIS — R7309 Other abnormal glucose: Secondary | ICD-10-CM | POA: Diagnosis not present

## 2017-10-25 ENCOUNTER — Encounter (HOSPITAL_COMMUNITY): Payer: Self-pay | Admitting: Nurse Practitioner

## 2017-10-25 ENCOUNTER — Ambulatory Visit (HOSPITAL_COMMUNITY)
Admission: RE | Admit: 2017-10-25 | Discharge: 2017-10-25 | Disposition: A | Discharge status: Home / Self Care | Payer: PPO | Source: Ambulatory Visit | Attending: Nurse Practitioner | Admitting: Nurse Practitioner

## 2017-10-25 VITALS — BP 134/82 | HR 60 | Ht 75.0 in | Wt 257.0 lb

## 2017-10-25 DIAGNOSIS — Z6832 Body mass index (BMI) 32.0-32.9, adult: Secondary | ICD-10-CM | POA: Diagnosis not present

## 2017-10-25 DIAGNOSIS — I480 Paroxysmal atrial fibrillation: Secondary | ICD-10-CM | POA: Diagnosis not present

## 2017-10-25 DIAGNOSIS — Z880 Allergy status to penicillin: Secondary | ICD-10-CM | POA: Insufficient documentation

## 2017-10-25 DIAGNOSIS — I10 Essential (primary) hypertension: Secondary | ICD-10-CM | POA: Insufficient documentation

## 2017-10-25 DIAGNOSIS — Z833 Family history of diabetes mellitus: Secondary | ICD-10-CM | POA: Insufficient documentation

## 2017-10-25 DIAGNOSIS — D649 Anemia, unspecified: Secondary | ICD-10-CM | POA: Diagnosis not present

## 2017-10-25 DIAGNOSIS — I471 Supraventricular tachycardia: Secondary | ICD-10-CM | POA: Diagnosis not present

## 2017-10-25 DIAGNOSIS — E663 Overweight: Secondary | ICD-10-CM | POA: Diagnosis not present

## 2017-10-25 DIAGNOSIS — Z825 Family history of asthma and other chronic lower respiratory diseases: Secondary | ICD-10-CM | POA: Diagnosis not present

## 2017-10-25 DIAGNOSIS — Z7901 Long term (current) use of anticoagulants: Secondary | ICD-10-CM | POA: Diagnosis not present

## 2017-10-25 DIAGNOSIS — E119 Type 2 diabetes mellitus without complications: Secondary | ICD-10-CM | POA: Diagnosis not present

## 2017-10-25 DIAGNOSIS — Z9889 Other specified postprocedural states: Secondary | ICD-10-CM | POA: Insufficient documentation

## 2017-10-25 DIAGNOSIS — Z79899 Other long term (current) drug therapy: Secondary | ICD-10-CM | POA: Insufficient documentation

## 2017-10-25 NOTE — Progress Notes (Signed)
Patient ID: Andrew Perkins, male   DOB: 1950-02-28, 67 y.o.   MRN: [id number]     Patient ID: Andrew Perkins, male   DOB: [birth year], 67 y.o.   MRN: [id number]   Date:  10/25/2017   PCP:  Farris Has, MD  Cardiologist:  Dr Eden Emms Primary Electrophysiologist: Hillis Range   Chief Complaint  Patient presents with  . Atrial Fibrillation     History of Present Illness: Andrew Perkins is a 67 y.o. male who presents today for f/u in the afib clinic.   The patient reports initially being diagnosed with atrial fibrillation 2/15 after presenting with tachypalpitations.  In retrospect, he thinks that he has had afib for 4-5 years.  Episodes were initially short, lasting several minutes.  At that time, he attributed them to "anxiety".  He developed sustained tachycardia 2/15 and he presented to Dr Vincente Liberty office with documented afib on ekg.  He was placed on eliquis and metoprolol.  He reports increasing frequency and duration of atrial fibrillation.  Several months ago, he was given flecainide as a "pill in pocket" approach.  He has required flecainide several times since then.  He had an event monitor placed 2/16 which has documented a more regular narrow complex SVT.  He has not tried daily AAD therapy though  He does take metoprolol twice daily.  He has very frequent palpitations which he finds worrisome.  He saw Dr. Johney Frame in consultation 4/16 and was given options to treat afib. He wanted to pursue AAD and avoid ablation.  He was started on flecainide 100 mg bid . He appears to be tolerating without adverse effects and most importantly has very little afib burden on drug. He noticed afib for one hour last Sunday and this is the most afib that he has noticed.  He reports  negative sleep study. He does try to walk daily. Remains on DOAC with chadsvasc score of 3. He states that he does have mild anemia and takes iron. He has had a colonoscopy in the last year and did have polys which were removed.  F/u clinic  07/1515, continue on flecainide and is doing will with litle afib burden. He report that he had 3 episode in early April, longest epiosde was 3 hours at v rate of 102 bpm, next episode 3 days later at 115 bpm, lasted one hour, last epiosde lasted 5 mins, at 99 bpm. Has not any further episodes since that 8th of April. He is still satisfied with afib management. He can now continue with what he is doing in afib vrs getting lightheaded and having to lay down.  Continues on eliquis, no bleeding issues.  F/u in afib clinic 12/08/15 and reports small afib burden. He is till happy with afib management. He is walking on a regular basis. Afib episodes can last 5 mins to an hour. No bleeding issues with eliquis.  F/u in afib clinc 04/07/16. He reports increase in afib burden over the last week because he traveled out of town and was out of his usual routine with diet and sleeping. He still is happy with flecainide and wants to continue with drug.   Return to afib clinic 7/18. Continues with flecainide and reports very little afib burden. Feels well. Continues with eliquis 5 mg bid, no bleeding issues currently.  F/u 10/25/17,he reports very little afib burden Is happy with current management with flecainide.. No bleeding issues. Had labs drawn with PCP yesterday.  Today, he denies symptoms of chest pain, shortness  of breath, orthopnea, PND, lower extremity edema, claudication, dizziness, presyncope, syncope, bleeding, or neurologic sequela. The patient is tolerating medications without difficulties and is otherwise without complaint today.    Past Medical History:  Diagnosis Date  . Anemia    a. Mild per pt - previously on iron but did not tolerate.  . Atrial fibrillation (HCC)    a. Dx 03/2013 (but ~3 yr of palps). Spont converted to NSR.  [name] Chronic neck pain 06/27/2017  . Diabetes mellitus without complication (HCC)    borderline  . Erectile dysfunction   . Former consumption of alcohol    a. Abstinent as  of 03/2013.  [name] Hx of echocardiogram    Echo (2/16):  Mod focal basal and mild concentric LVH, vigorous LVF, EF 65-70%, no RWMA, Gr 1 DD, trivial AI, severe LAE  . Hypertension   . Left atrial enlargement   . SVT (supraventricular tachycardia) (HCC)    Past Surgical History:  Procedure Laterality Date  . CIRCUMCISION     at 67yrs old  . TONSILLECTOMY    . WISDOM TOOTH EXTRACTION       Current Outpatient Medications  Medication Sig Dispense Refill  . apixaban (ELIQUIS) 5 MG TABS tablet Take 1 tablet (5 mg total) by mouth 2 (two) times daily. 180 tablet 2  . atorvastatin (LIPITOR) 20 MG tablet TAKE 1 TABLET BY MOUTH  DAILY AT 6 PM. 90 tablet 3  . diclofenac sodium (VOLTAREN) 1 % GEL Apply 1 application topically daily as needed for pain.    . flecainide (TAMBOCOR) 100 MG tablet Take 1 tablet (100 mg total) by mouth 2 (two) times daily. 180 tablet 2  . losartan (COZAAR) 100 MG tablet Take 100 mg by mouth daily.    . metoprolol tartrate (LOPRESSOR) 25 MG tablet Take 1 tablet (25 mg total) by mouth 2 (two) times daily. 180 tablet 3  . Omega-3 Fatty Acids (FISH OIL PO) Take 1 capsule by mouth daily.    . psyllium (HYDROCIL/METAMUCIL) 95 % PACK Take 1 packet by mouth daily.    [name] gabapentin (NEURONTIN) 300 MG capsule One capsule twice a day for 2 weeks, then take one in the morning and 2 in the evening (Patient not taking: Reported on 10/25/2017) 90 capsule 3   No current facility-administered medications for this encounter.     Allergies:   Penicillins   Social History:  The patient  reports that he has never smoked. He has never used smokeless tobacco. He reports that he does not drink alcohol or use drugs.   Family History:  The patient's  family history includes Asthma in his brother; Diabetes Mellitus II in his brother.    ROS:  Please see the history of present illness.   All other systems are reviewed and negative.    PHYSICAL EXAM: VS:  BP 134/82 (BP Location: Right Arm, Patient  Position: Sitting, Cuff Size: Large)   Pulse 60   Ht 6\' 3"  (1.905 m)   Wt 116.6 kg   BMI 32.12 kg/m  , BMI Body mass index is 32.12 kg/m. GEN: overweight in no acute distress  HEENT: normal  Neck: no JVD, carotid bruits, or masses Cardiac: RRR; no murmurs, rubs, or gallops,no edema  Respiratory:  clear to auscultation bilaterally, normal work of breathing GI: soft, nontender, nondistended, + BS MS: no deformity or atrophy  Skin: warm and dry  Neuro:  Strength and sensation are intact Psych: euthymic mood, full affect  EKG:  EKG  is ordered today. The ekg ordered today shows sinus brady at 57 bpm, pr int 166 ms, qrs int , qtc 438 ms Epic records reviewed    Wt Readings from Last 3 Encounters:  10/25/17 116.6 kg  06/27/17 117.9 kg  04/24/17 117.9 kg       ASSESSMENT AND PLAN:  1.  Paroxysmal atrial fibrillation/ SVT Continue flecainide 100 mg bid/metoprolol 25 mg bid. Continue eliquis for chads2vasc score of at least 3.  Should he fail medical therapy with flecainide then he may be more willing to consider ablation.   2. HTN Stable No change required today  3. Overweight Body mass index is 32.12 kg/m. Weight loss is advised Continue regular exercise   F/u in afib clinic in one year F/u with Dr. Eden Emms  as per recall    Elvina Sidle. [facility] [address] [place], [place] [address] [phone]

## 2017-10-31 ENCOUNTER — Ambulatory Visit: Payer: PPO | Admitting: Nurse Practitioner

## 2018-02-05 ENCOUNTER — Telehealth: Payer: Self-pay | Admitting: Cardiovascular Disease

## 2018-02-05 NOTE — Telephone Encounter (Signed)
This is a A-Fib pt 

## 2018-02-05 NOTE — Telephone Encounter (Signed)
  Patient calling the office for samples of medication:   1.  What medication and dosage are you requesting samples for? apixaban (ELIQUIS) 5 MG TABS tablet  2.  Are you currently out of this medication? Has a few left

## 2018-02-06 NOTE — Telephone Encounter (Signed)
Talked with patient 

## 2018-02-26 ENCOUNTER — Other Ambulatory Visit (HOSPITAL_COMMUNITY): Payer: Self-pay | Admitting: *Deleted

## 2018-02-26 MED ORDER — METOPROLOL TARTRATE 25 MG PO TABS: 25.0000 mg | ORAL_TABLET | Freq: Two times a day (BID) | ORAL | 3 refills | Status: DC

## 2018-04-18 ENCOUNTER — Other Ambulatory Visit (HOSPITAL_COMMUNITY): Payer: Self-pay | Admitting: *Deleted

## 2018-04-18 MED ORDER — FLECAINIDE ACETATE 100 MG PO TABS: 100.0000 mg | ORAL_TABLET | Freq: Two times a day (BID) | ORAL | 2 refills | Status: DC

## 2018-04-20 NOTE — Progress Notes (Signed)
Patient ID: Andrew Perkins, male   DOB: 1950/10/03, 68 y.o.   MRN: [id number]     Patient ID: Andrew Perkins, male   DOB: [birth year], 68 y.o.   MRN: [id number]   Date:  04/24/2018   PCP:  Farris Has, MD  Cardiologist:  Dr Eden Emms Primary Electrophysiologist: Hillis Range   No chief complaint on file.    History of Present Illness:  68 y.o. history of PAF. First noted 2015. Been on eliquis, metroprolol and flecainide with  Minimal afib burden Has seen Dr Johney Frame and preferred not to have ablation. Also has narrow Complex SVT. CHADSVASC score 3. Last normal ETT 2016   Still with occasional break through Episodes don't last long and HR only in 90's so he does Not get dizzy now.   Prefers to stay on flecainide and not consider ablation still No missed doses of DOAC  Concerned about some dyspnea but can walk and cycle daily No cough sputum or wheezing Has not had CXR in a while    Past Medical History:  Diagnosis Date  . Anemia    a. Mild per pt - previously on iron but did not tolerate.  . Atrial fibrillation (HCC)    a. Dx 03/2013 (but ~3 yr of palps). Spont converted to NSR.  [name] Chronic neck pain 06/27/2017  . Diabetes mellitus without complication (HCC)    borderline  . Erectile dysfunction   . Former consumption of alcohol    a. Abstinent as of 03/2013.  [name] Hx of echocardiogram    Echo (2/16):  Mod focal basal and mild concentric LVH, vigorous LVF, EF 65-70%, no RWMA, Gr 1 DD, trivial AI, severe LAE  . Hypertension   . Left atrial enlargement   . SVT (supraventricular tachycardia) (HCC)    Past Surgical History:  Procedure Laterality Date  . CIRCUMCISION     at 68yrs old  . TONSILLECTOMY    . WISDOM TOOTH EXTRACTION       Current Outpatient Medications  Medication Sig Dispense Refill  . apixaban (ELIQUIS) 5 MG TABS tablet Take 1 tablet (5 mg total) by mouth 2 (two) times daily. 180 tablet 2  . atorvastatin (LIPITOR) 20 MG tablet TAKE 1 TABLET BY MOUTH  DAILY AT 6 PM. 90  tablet 3  . diclofenac sodium (VOLTAREN) 1 % GEL Apply 1 application topically daily as needed for pain.    . flecainide (TAMBOCOR) 100 MG tablet Take 1 tablet (100 mg total) by mouth 2 (two) times daily. 180 tablet 2  . gabapentin (NEURONTIN) 300 MG capsule One capsule twice a day for 2 weeks, then take one in the morning and 2 in the evening 90 capsule 3  . losartan (COZAAR) 100 MG tablet Take 100 mg by mouth daily.    . metoprolol tartrate (LOPRESSOR) 25 MG tablet Take 1 tablet (25 mg total) by mouth 2 (two) times daily. 180 tablet 3  . Omega-3 Fatty Acids (FISH OIL PO) Take 1 capsule by mouth daily.    . psyllium (HYDROCIL/METAMUCIL) 95 % PACK Take 1 packet by mouth daily.     No current facility-administered medications for this visit.     Allergies:   Penicillins   Social History:  The patient  reports that he has never smoked. He has never used smokeless tobacco. He reports that he does not drink alcohol or use drugs.   Family History:  The patient's  family history includes Asthma in his brother; Diabetes Mellitus II in his brother.  ROS:  Please see the history of present illness.   All other systems are reviewed and negative.    PHYSICAL EXAM: VS:  BP 140/86   Pulse (!) 57   Ht 6\' 3"  (1.905 m)   Wt 119.9 kg   SpO2 98%   BMI 33.05 kg/m  , BMI Body mass index is 33.05 kg/m. Affect appropriate Healthy:  appears stated age HEENT: normal Neck supple with no adenopathy JVP normal no bruits no thyromegaly Lungs clear with no wheezing and good diaphragmatic motion Heart:  S1/S2 no murmur, no rub, gallop or click PMI normal Abdomen: benighn, BS positve, no tenderness, no AAA no bruit.  No HSM or HJR Distal pulses intact with no bruits No edema Neuro non-focal Skin warm and dry No muscular weakness   EKG:  04/24/18 SR rate 57 PR 220 otherwise normal    Recent Labs: No results found for requested labs within last 8760 hours.    Lipid Panel  No results found for:  CHOL, TRIG, HDL, CHOLHDL, VLDL, LDLCALC, LDLDIRECT   Wt Readings from Last 3 Encounters:  04/24/18 119.9 kg  10/25/17 116.6 kg  06/27/17 117.9 kg      Other studies Reviewed: Additional studies/ records that were reviewed today include echo, labs, prior EKG's and Dr. Jenel Lucks mote.    ASSESSMENT AND PLAN:  1.  Paroxysmal atrial fibrillation/ SVT continue beta blocker , flecainide  Continue eliquis for chads2vasc score of at least 3.  Should he fail medical therapy with flecainide then he may be more willing to consider ablation.   2. HTN  Well controlled.  Continue current medications and low sodium Dash type diet.    3. Overweight discussed low carb diet   4. ED:  Ok to use vasodilators but will need primary to prescribe  5. Dyspnea:  Seems functional check TTE make sure RV/LV EF normal f/u primary for cxr And possible PFTls   Follow with  me in a year    Signed, Charlton Haws, MD  04/24/2018 10:52 AM

## 2018-04-24 ENCOUNTER — Ambulatory Visit: Payer: PPO | Admitting: Cardiovascular Disease

## 2018-04-24 ENCOUNTER — Encounter: Payer: Self-pay | Admitting: Cardiovascular Disease

## 2018-04-24 VITALS — BP 140/86 | HR 57 | Ht 75.0 in | Wt 264.4 lb

## 2018-04-24 DIAGNOSIS — R0600 Dyspnea, unspecified: Secondary | ICD-10-CM

## 2018-04-24 DIAGNOSIS — I480 Paroxysmal atrial fibrillation: Secondary | ICD-10-CM | POA: Diagnosis not present

## 2018-04-24 DIAGNOSIS — I10 Essential (primary) hypertension: Secondary | ICD-10-CM

## 2018-04-24 NOTE — Patient Instructions (Addendum)
Medication Instructions:   If you need a refill on your cardiac medications before your next appointment, please call your pharmacy.   Lab work:  If you have labs (blood work) drawn today and your tests are completely normal, you will receive your results only by: . MyChart Message (if you have MyChart) OR . A paper copy in the mail If you have any lab test that is abnormal or we need to change your treatment, we will call you to review the results.  Testing/Procedures: Your physician has requested that you have an echocardiogram. Echocardiography is a painless test that uses sound waves to create images of your heart. It provides your doctor with information about the size and shape of your heart and how well your heart's chambers and valves are working. This procedure takes approximately one hour. There are no restrictions for this procedure.   Follow-Up: At [facility], you and your health needs are our priority.  As part of our continuing mission to provide you with exceptional heart care, we have created designated Provider Care Teams.  These Care Teams include your primary Cardiologist (physician) and Advanced Practice Providers (APPs -  Physician Assistants and Nurse Practitioners) who all work together to provide you with the care you need, when you need it. You will need a follow up appointment in 12 months.  Please call our office 2 months in advance to schedule this appointment.  You may see Dr. Nishan or one of the following Advanced Practice Providers on your designated Care Team:   Lori Gerhardt, NP Laura Ingold, NP . Jill McDaniel, NP    

## 2018-04-26 ENCOUNTER — Ambulatory Visit
Admission: RE | Admit: 2018-04-26 | Discharge: 2018-04-26 | Disposition: A | Discharge status: Home / Self Care | Payer: PPO | Source: Ambulatory Visit | Attending: Family Medicine | Admitting: Family Medicine

## 2018-04-26 ENCOUNTER — Other Ambulatory Visit: Payer: Self-pay | Admitting: Family Medicine

## 2018-04-26 DIAGNOSIS — R0602 Shortness of breath: Secondary | ICD-10-CM

## 2018-04-26 DIAGNOSIS — R7309 Other abnormal glucose: Secondary | ICD-10-CM | POA: Diagnosis not present

## 2018-04-26 DIAGNOSIS — E785 Hyperlipidemia, unspecified: Secondary | ICD-10-CM | POA: Diagnosis not present

## 2018-04-26 DIAGNOSIS — J9811 Atelectasis: Secondary | ICD-10-CM | POA: Diagnosis not present

## 2018-04-26 DIAGNOSIS — I4891 Unspecified atrial fibrillation: Secondary | ICD-10-CM | POA: Diagnosis not present

## 2018-04-26 DIAGNOSIS — I10 Essential (primary) hypertension: Secondary | ICD-10-CM | POA: Diagnosis not present

## 2018-05-01 ENCOUNTER — Ambulatory Visit (HOSPITAL_COMMUNITY): Payer: PPO | Attending: Cardiovascular Disease

## 2018-05-01 DIAGNOSIS — R0600 Dyspnea, unspecified: Secondary | ICD-10-CM | POA: Insufficient documentation

## 2018-05-01 DIAGNOSIS — I480 Paroxysmal atrial fibrillation: Secondary | ICD-10-CM | POA: Insufficient documentation

## 2018-05-01 DIAGNOSIS — I10 Essential (primary) hypertension: Secondary | ICD-10-CM | POA: Diagnosis not present

## 2018-05-01 MED ORDER — PERFLUTREN LIPID MICROSPHERE
1.0000 mL | INTRAVENOUS | Status: AC | PRN
  Administered 2018-05-01: 2 mL via INTRAVENOUS

## 2018-05-28 ENCOUNTER — Other Ambulatory Visit (HOSPITAL_COMMUNITY): Payer: Self-pay | Admitting: *Deleted

## 2018-05-28 MED ORDER — APIXABAN 5 MG PO TABS: 5.0000 mg | ORAL_TABLET | Freq: Two times a day (BID) | ORAL | 2 refills | Status: DC

## 2018-09-05 ENCOUNTER — Other Ambulatory Visit: Payer: Self-pay

## 2018-09-05 DIAGNOSIS — Z20822 Contact with and (suspected) exposure to covid-19: Secondary | ICD-10-CM

## 2018-09-09 LAB — NOVEL CORONAVIRUS, NAA: SARS-CoV-2, NAA: NOT DETECTED

## 2018-09-11 DIAGNOSIS — H524 Presbyopia: Secondary | ICD-10-CM | POA: Diagnosis not present

## 2018-09-11 DIAGNOSIS — H5203 Hypermetropia, bilateral: Secondary | ICD-10-CM | POA: Diagnosis not present

## 2018-09-11 DIAGNOSIS — Z135 Encounter for screening for eye and ear disorders: Secondary | ICD-10-CM | POA: Diagnosis not present

## 2018-09-11 DIAGNOSIS — H52223 Regular astigmatism, bilateral: Secondary | ICD-10-CM | POA: Diagnosis not present

## 2018-09-11 DIAGNOSIS — H2513 Age-related nuclear cataract, bilateral: Secondary | ICD-10-CM | POA: Diagnosis not present

## 2018-11-14 DIAGNOSIS — I4891 Unspecified atrial fibrillation: Secondary | ICD-10-CM | POA: Diagnosis not present

## 2018-11-14 DIAGNOSIS — Z125 Encounter for screening for malignant neoplasm of prostate: Secondary | ICD-10-CM | POA: Diagnosis not present

## 2018-11-14 DIAGNOSIS — R7309 Other abnormal glucose: Secondary | ICD-10-CM | POA: Diagnosis not present

## 2018-11-14 DIAGNOSIS — I10 Essential (primary) hypertension: Secondary | ICD-10-CM | POA: Diagnosis not present

## 2018-11-14 DIAGNOSIS — Z0000 Encounter for general adult medical examination without abnormal findings: Secondary | ICD-10-CM | POA: Diagnosis not present

## 2018-11-14 DIAGNOSIS — R5383 Other fatigue: Secondary | ICD-10-CM | POA: Diagnosis not present

## 2018-11-14 DIAGNOSIS — Z23 Encounter for immunization: Secondary | ICD-10-CM | POA: Diagnosis not present

## 2018-11-14 DIAGNOSIS — R12 Heartburn: Secondary | ICD-10-CM | POA: Diagnosis not present

## 2018-11-14 DIAGNOSIS — E559 Vitamin D deficiency, unspecified: Secondary | ICD-10-CM | POA: Diagnosis not present

## 2018-11-14 DIAGNOSIS — E785 Hyperlipidemia, unspecified: Secondary | ICD-10-CM | POA: Diagnosis not present

## 2018-11-15 ENCOUNTER — Other Ambulatory Visit: Payer: Self-pay

## 2018-11-15 ENCOUNTER — Ambulatory Visit (HOSPITAL_COMMUNITY)
Admission: RE | Admit: 2018-11-15 | Discharge: 2018-11-15 | Disposition: A | Discharge status: Home / Self Care | Payer: PPO | Source: Ambulatory Visit | Attending: Nurse Practitioner | Admitting: Nurse Practitioner

## 2018-11-15 ENCOUNTER — Encounter (HOSPITAL_COMMUNITY): Payer: Self-pay | Admitting: Nurse Practitioner

## 2018-11-15 VITALS — BP 158/98 | HR 61 | Ht 75.0 in | Wt 253.2 lb

## 2018-11-15 DIAGNOSIS — I480 Paroxysmal atrial fibrillation: Secondary | ICD-10-CM | POA: Diagnosis not present

## 2018-11-15 DIAGNOSIS — I4891 Unspecified atrial fibrillation: Secondary | ICD-10-CM | POA: Diagnosis present

## 2018-11-15 DIAGNOSIS — Z880 Allergy status to penicillin: Secondary | ICD-10-CM | POA: Diagnosis not present

## 2018-11-15 DIAGNOSIS — Z7901 Long term (current) use of anticoagulants: Secondary | ICD-10-CM | POA: Diagnosis not present

## 2018-11-15 DIAGNOSIS — I471 Supraventricular tachycardia: Secondary | ICD-10-CM | POA: Insufficient documentation

## 2018-11-15 DIAGNOSIS — Z79899 Other long term (current) drug therapy: Secondary | ICD-10-CM | POA: Diagnosis not present

## 2018-11-15 DIAGNOSIS — I440 Atrioventricular block, first degree: Secondary | ICD-10-CM | POA: Insufficient documentation

## 2018-11-15 DIAGNOSIS — I10 Essential (primary) hypertension: Secondary | ICD-10-CM | POA: Diagnosis not present

## 2018-11-15 DIAGNOSIS — Z6831 Body mass index (BMI) 31.0-31.9, adult: Secondary | ICD-10-CM | POA: Diagnosis not present

## 2018-11-15 DIAGNOSIS — E663 Overweight: Secondary | ICD-10-CM | POA: Diagnosis not present

## 2018-11-15 DIAGNOSIS — Z833 Family history of diabetes mellitus: Secondary | ICD-10-CM | POA: Diagnosis not present

## 2018-11-15 DIAGNOSIS — R7303 Prediabetes: Secondary | ICD-10-CM | POA: Diagnosis not present

## 2018-11-15 NOTE — Progress Notes (Signed)
Patient ID: Andrew Perkins, male   DOB: 14-Mar-1950, 68 y.o.   MRN: [id number]     Patient ID: Andrew Perkins, male   DOB: 14-Mar-1950, 68 y.o.   MRN: [id number]   Date:  11/15/2018   PCP:  Farris HasMorrow, Aaron, MD  Cardiologist:  Dr Eden EmmsNishan Primary Electrophysiologist: Hillis RangeAllred, James   No chief complaint on file.    History of Present Illness: Andrew Perkins is a 68 y.o. male who presents today for f/u in the afib clinic.   The patient reports initially being diagnosed with atrial fibrillation 2/15 after presenting with tachypalpitations.  In retrospect, he thinks that he has had afib for 4-5 years.  Episodes were initially short, lasting several minutes.  At that time, he attributed them to "anxiety".  He developed sustained tachycardia 2/15 and he presented to Dr Vincente LibertyMorrow's office with documented afib on ekg.  He was placed on eliquis and metoprolol.  He reports increasing frequency and duration of atrial fibrillation.  Several months ago, he was given flecainide as a "pill in pocket" approach.  He has required flecainide several times since then.  He had an event monitor placed 2/16 which has documented a more regular narrow complex SVT.  He has not tried daily AAD therapy though  He does take metoprolol twice daily.  He has very frequent palpitations which he finds worrisome.  He saw Dr. Johney FrameAllred in consultation 4/16 and was given options to treat afib. He wanted to pursue AAD and avoid ablation.  He was started on flecainide 100 mg bid . He appears to be tolerating without adverse effects and most importantly has very little afib burden on drug. He noticed afib for one hour last Sunday and this is the most afib that he has noticed.  He reports  negative sleep study. He does try to walk daily. Remains on DOAC with chadsvasc score of 3. He states that he does have mild anemia and takes iron. He has had a colonoscopy in the last year and did have polys which were removed.  F/u clinic 07/1515, continue on flecainide and is  doing will with litle afib burden. He report that he had 3 episode in early April, longest epiosde was 3 hours at v rate of 102 bpm, next episode 3 days later at 115 bpm, lasted one hour, last epiosde lasted 5 mins, at 99 bpm. Has not any further episodes since that 8th of April. He is still satisfied with afib management. He can now continue with what he is doing in afib vrs getting lightheaded and having to lay down.  Continues on eliquis, no bleeding issues.  F/u in afib clinic 12/08/15 and reports small afib burden. He is till happy with afib management. He is walking on a regular basis. Afib episodes can last 5 mins to an hour. No bleeding issues with eliquis.  F/u in afib clinc 04/07/16. He reports increase in afib burden over the last week because he traveled out of town and was out of his usual routine with diet and sleeping. He still is happy with flecainide and wants to continue with drug.   Return to afib clinic 7/18. Continues with flecainide and reports very little afib burden. Feels well. Continues with eliquis 5 mg bid, no bleeding issues currently.  F/u 10/25/17,he reports very little afib burden Is happy with current management with flecainide.. No bleeding issues. Had labs drawn with PCP yesterday.  F/u afib clinic 11/15/18. He is still happy with his afib burden on flecainide.  He has 1-2 outbreaks a month, lasting around one hour. He does not have to stop what he is doing when he is in afib. He usually knows only  because of higher heart rates on his watch.   Today, he denies symptoms of chest pain, shortness of breath, orthopnea, PND, lower extremity edema, claudication, dizziness, presyncope, syncope, bleeding, or neurologic sequela. The patient is tolerating medications without difficulties and is otherwise without complaint today.    Past Medical History:  Diagnosis Date  . Anemia    a. Mild per pt - previously on iron but did not tolerate.  . Atrial fibrillation (HCC)    a. Dx  03/2013 (but ~3 yr of palps). Spont converted to NSR.  [name] Chronic neck pain 06/27/2017  . Diabetes mellitus without complication (HCC)    borderline  . Erectile dysfunction   . Former consumption of alcohol    a. Abstinent as of 03/2013.  [name] Hx of echocardiogram    Echo (2/16):  Mod focal basal and mild concentric LVH, vigorous LVF, EF 65-70%, no RWMA, Gr 1 DD, trivial AI, severe LAE  . Hypertension   . Left atrial enlargement   . SVT (supraventricular tachycardia) (HCC)    Past Surgical History:  Procedure Laterality Date  . CIRCUMCISION     at 67yrs old  . TONSILLECTOMY    . WISDOM TOOTH EXTRACTION       Current Outpatient Medications  Medication Sig Dispense Refill  . apixaban (ELIQUIS) 5 MG TABS tablet Take 1 tablet (5 mg total) by mouth 2 (two) times daily. 180 tablet 2  . atorvastatin (LIPITOR) 20 MG tablet TAKE 1 TABLET BY MOUTH  DAILY AT 6 PM. 90 tablet 3  . diclofenac sodium (VOLTAREN) 1 % GEL Apply 1 application topically daily as needed for pain.    . flecainide (TAMBOCOR) 100 MG tablet Take 1 tablet (100 mg total) by mouth 2 (two) times daily. 180 tablet 2  . losartan (COZAAR) 100 MG tablet Take 100 mg by mouth daily.    . metoprolol tartrate (LOPRESSOR) 25 MG tablet Take 1 tablet (25 mg total) by mouth 2 (two) times daily. 180 tablet 3  . psyllium (HYDROCIL/METAMUCIL) 95 % PACK Take 1 packet by mouth daily.     No current facility-administered medications for this encounter.     Allergies:   Penicillins   Social History:  The patient  reports that he has never smoked. He has never used smokeless tobacco. He reports that he does not drink alcohol or use drugs.   Family History:  The patient's  family history includes Asthma in his brother; Diabetes Mellitus II in his brother.    ROS:  Please see the history of present illness.   All other systems are reviewed and negative.    PHYSICAL EXAM: VS:  BP (!) 158/98   Pulse 61   Ht 6\' 3"  (1.905 m)   Wt 114.9 kg   BMI  31.65 kg/m  , BMI Body mass index is 31.65 kg/m. GEN: overweight in no acute distress  HEENT: normal  Neck: no JVD, carotid bruits, or masses Cardiac: RRR; no murmurs, rubs, or gallops,no edema  Respiratory:  clear to auscultation bilaterally, normal work of breathing GI: soft, nontender, nondistended, + BS MS: no deformity or atrophy  Skin: warm and dry  Neuro:  Strength and sensation are intact Psych: euthymic mood, full affect  EKG:  EKG is ordered today. The ekg ordered today shows sinus brady at 57  bpm, pr int 166 ms, qrs int 128ms, qtc 438 ms Epic records reviewed    Wt Readings from Last 3 Encounters:  11/15/18 114.9 kg  04/24/18 119.9 kg  10/25/17 116.6 kg       ASSESSMENT AND PLAN:  1.  Paroxysmal atrial fibrillation/ SVT Continue flecainide 100 mg bid/metoprolol 25 mg bid. Continue eliquis for chads2vasc score of at least 3. No bleeding issuesHe still prefers to avoid ablation at this point  2. HTN  BP elevated today Saw his PCP yesterday for physical and BP was elevated  He is to f/u there for repat BP next week  Avoid salt  3. Overweight Body mass index is 31.65 kg/m. Weight loss is advised Continue regular exercise   F/u in afib clinic in one year F/u with Dr. Johnsie Cancel  as per recall    Geroge Baseman. Enis Riecke, [facility] [address] [place], [place] [address] [phone]

## 2019-01-10 ENCOUNTER — Other Ambulatory Visit: Payer: Self-pay

## 2019-01-10 DIAGNOSIS — Z20822 Contact with and (suspected) exposure to covid-19: Secondary | ICD-10-CM

## 2019-01-14 LAB — NOVEL CORONAVIRUS, NAA: SARS-CoV-2, NAA: NOT DETECTED

## 2019-02-04 ENCOUNTER — Telehealth: Payer: Self-pay | Admitting: Cardiovascular Disease

## 2019-02-04 NOTE — Telephone Encounter (Signed)
Samples provided for patient.   

## 2019-02-04 NOTE — Telephone Encounter (Signed)
New Message  The pt is calling and wants to know if he can get Eliquis samples.  Please call to discuss

## 2019-02-28 ENCOUNTER — Other Ambulatory Visit (HOSPITAL_COMMUNITY): Payer: Self-pay | Admitting: *Deleted

## 2019-02-28 MED ORDER — FLECAINIDE ACETATE 100 MG PO TABS: 100.0000 mg | ORAL_TABLET | Freq: Two times a day (BID) | ORAL | 2 refills | Status: DC

## 2019-03-07 DIAGNOSIS — R0602 Shortness of breath: Secondary | ICD-10-CM | POA: Diagnosis not present

## 2019-03-07 DIAGNOSIS — R635 Abnormal weight gain: Secondary | ICD-10-CM | POA: Diagnosis not present

## 2019-03-07 DIAGNOSIS — K219 Gastro-esophageal reflux disease without esophagitis: Secondary | ICD-10-CM | POA: Diagnosis not present

## 2019-03-07 DIAGNOSIS — R7309 Other abnormal glucose: Secondary | ICD-10-CM | POA: Diagnosis not present

## 2019-03-07 DIAGNOSIS — I480 Paroxysmal atrial fibrillation: Secondary | ICD-10-CM | POA: Diagnosis not present

## 2019-03-11 ENCOUNTER — Other Ambulatory Visit: Payer: Self-pay

## 2019-03-11 ENCOUNTER — Emergency Department (HOSPITAL_COMMUNITY): Payer: PPO

## 2019-03-11 ENCOUNTER — Emergency Department (HOSPITAL_COMMUNITY)
Admission: EM | Admit: 2019-03-11 | Discharge: 2019-03-11 | Disposition: A | Discharge status: Home / Self Care | Payer: PPO | Attending: Emergency Medicine | Admitting: Emergency Medicine

## 2019-03-11 DIAGNOSIS — Z79899 Other long term (current) drug therapy: Secondary | ICD-10-CM | POA: Insufficient documentation

## 2019-03-11 DIAGNOSIS — H538 Other visual disturbances: Secondary | ICD-10-CM | POA: Diagnosis not present

## 2019-03-11 DIAGNOSIS — I10 Essential (primary) hypertension: Secondary | ICD-10-CM | POA: Insufficient documentation

## 2019-03-11 DIAGNOSIS — I4891 Unspecified atrial fibrillation: Secondary | ICD-10-CM

## 2019-03-11 DIAGNOSIS — H539 Unspecified visual disturbance: Secondary | ICD-10-CM

## 2019-03-11 DIAGNOSIS — Z7901 Long term (current) use of anticoagulants: Secondary | ICD-10-CM | POA: Insufficient documentation

## 2019-03-11 LAB — COMPREHENSIVE METABOLIC PANEL
ALT: 15 U/L (ref 0–44)
AST: 18 U/L (ref 15–41)
Albumin: 3.8 g/dL (ref 3.5–5.0)
Alkaline Phosphatase: 67 U/L (ref 38–126)
Anion gap: 8 (ref 5–15)
BUN: 14 mg/dL (ref 8–23)
CO2: 22 mmol/L (ref 22–32)
Calcium: 9.1 mg/dL (ref 8.9–10.3)
Chloride: 108 mmol/L (ref 98–111)
Creatinine, Ser: 1.16 mg/dL (ref 0.61–1.24)
GFR calc Af Amer: >60 mL/min (ref >60)
GFR calc non Af Amer: >60 mL/min (ref >60)
Glucose, Bld: 120 mg/dL — ABNORMAL HIGH (ref 70–99)
Potassium: 4.1 mmol/L (ref 3.5–5.1)
Sodium: 138 mmol/L (ref 135–145)
Total Bilirubin: 0.6 mg/dL (ref 0.3–1.2)
Total Protein: 7.1 g/dL (ref 6.5–8.1)

## 2019-03-11 LAB — PROTIME-INR
INR: 1.1 (ref 0.8–1.2)
Prothrombin Time: 13.8 seconds (ref 11.4–15.2)

## 2019-03-11 LAB — DIFFERENTIAL
Abs Immature Granulocytes: 0.01 10*3/uL (ref 0.00–0.07)
Basophils Absolute: 0 10*3/uL (ref 0.0–0.1)
Basophils Relative: 0 %
Eosinophils Absolute: 0.5 10*3/uL (ref 0.0–0.5)
Eosinophils Relative: 7 %
Immature Granulocytes: 0 %
Lymphocytes Relative: 34 %
Lymphs Abs: 2.5 10*3/uL (ref 0.7–4.0)
Monocytes Absolute: 0.8 10*3/uL (ref 0.1–1.0)
Monocytes Relative: 11 %
Neutro Abs: 3.4 10*3/uL (ref 1.7–7.7)
Neutrophils Relative %: 48 %

## 2019-03-11 LAB — CBC
HCT: 41.4 % (ref 39.0–52.0)
Hemoglobin: 13.5 g/dL (ref 13.0–17.0)
MCH: 28.8 pg (ref 26.0–34.0)
MCHC: 32.6 g/dL (ref 30.0–36.0)
MCV: 88.3 fL (ref 80.0–100.0)
Platelets: 219 10*3/uL (ref 150–400)
RBC: 4.69 MIL/uL (ref 4.22–5.81)
RDW: 14.7 % (ref 11.5–15.5)
WBC: 7.3 10*3/uL (ref 4.0–10.5)
nRBC: 0 % (ref 0.0–0.2)

## 2019-03-11 LAB — I-STAT CHEM 8, ED
BUN: 17 mg/dL (ref 8–23)
Calcium, Ion: 1.16 mmol/L (ref 1.15–1.40)
Chloride: 107 mmol/L (ref 98–111)
Creatinine, Ser: 1.1 mg/dL (ref 0.61–1.24)
Glucose, Bld: 116 mg/dL — ABNORMAL HIGH (ref 70–99)
HCT: 41 % (ref 39.0–52.0)
Hemoglobin: 13.9 g/dL (ref 13.0–17.0)
Potassium: 4.1 mmol/L (ref 3.5–5.1)
Sodium: 140 mmol/L (ref 135–145)
TCO2: 25 mmol/L (ref 22–32)

## 2019-03-11 LAB — APTT: aPTT: 45 seconds — ABNORMAL HIGH (ref 24–36)

## 2019-03-11 MED ORDER — IOHEXOL 350 MG/ML SOLN
100.0000 mL | Freq: Once | INTRAVENOUS | Status: AC | PRN
  Administered 2019-03-11: 100 mL via INTRAVENOUS

## 2019-03-11 MED ORDER — SODIUM CHLORIDE 0.9% FLUSH: 3.0000 mL | Freq: Once | INTRAVENOUS | Status: DC

## 2019-03-11 NOTE — ED Provider Notes (Signed)
[facility] Provider Note   CSN: [id number] Arrival date & time: 03/11/19  1732     History No chief complaint on file.   Andrew Perkins is a 69 y.o. male.  HPI     69 year old male with history of A. fib, diabetes comes in a chief complaint of vision change. Patient reported on Saturday started noticing subtle eyelash type area of darkness.  He felt like there was an eyelash in his eye but despite cleaning up his eyes the visual deficits persist.  Patient denies any associated dizziness, lightheadedness, focal numbness, focal weakness, balance issues.  He has no history of strokes or coronary artery disease.  Patient has been taking his Eliquis as prescribed.  He called his PCP and was advised to come to the ER.  Past Medical History:  Diagnosis Date  . Anemia    a. Mild per pt - previously on iron but did not tolerate.  . Atrial fibrillation ([place])    a. Dx 03/2013 (but ~3 yr of palps). Spont converted to NSR.  [name] Chronic neck pain 06/27/2017  . Diabetes mellitus without complication (HCC)    borderline  . Erectile dysfunction   . Former consumption of alcohol    a. Abstinent as of 03/2013.  [name] Hx of echocardiogram    Echo (2/16):  Mod focal basal and mild concentric LVH, vigorous LVF, EF 65-70%, no RWMA, Gr 1 DD, trivial AI, severe LAE  . Hypertension   . Left atrial enlargement   . SVT (supraventricular tachycardia) [facility])     Patient Active Problem List   Diagnosis Date Noted  . Chronic neck pain 06/27/2017  . Overweight 06/02/2014  . Chest pain 04/30/2013  . Former consumption of alcohol   . Atrial fibrillation ([place]) 04/17/2013  . Atrial fibrillation with RVR ([place]) 04/17/2013  . HTN (hypertension) 04/17/2013    Past Surgical History:  Procedure Laterality Date  . CIRCUMCISION     at 69yrs old  . TONSILLECTOMY    . WISDOM TOOTH EXTRACTION         Family History  Problem Relation Age of Onset  . Diabetes Mellitus II  Brother   . Asthma Brother   . CAD Neg Hx   . Stroke Neg Hx   . Heart attack Neg Hx     Social History   Tobacco Use  . Smoking status: Never Smoker  . Smokeless tobacco: Never Used  Substance Use Topics  . Alcohol use: No    Alcohol/week: 0.0 standard drinks  . Drug use: No    Home Medications Prior to Admission medications   Medication Sig Start Date End Date Taking? Authorizing Provider  apixaban (ELIQUIS) 5 MG TABS tablet Take 1 tablet (5 mg total) by mouth 2 (two) times daily. 05/28/18   Sherran Needs, NP  atorvastatin (LIPITOR) 20 MG tablet TAKE 1 TABLET BY MOUTH  DAILY AT 6 PM. 08/19/15   Josue Hector, MD  diclofenac sodium (VOLTAREN) 1 % GEL Apply 1 application topically daily as needed for pain. 03/01/16   [provider]  flecainide (TAMBOCOR) 100 MG tablet Take 1 tablet (100 mg total) by mouth 2 (two) times daily. 02/28/19   Sherran Needs, NP  losartan (COZAAR) 100 MG tablet Take 100 mg by mouth daily.    [provider]  metoprolol tartrate (LOPRESSOR) 25 MG tablet Take 1 tablet (25 mg total) by mouth 2 (two) times daily. 02/26/18   Sherran Needs,  NP  psyllium (HYDROCIL/METAMUCIL) 95 % PACK Take 1 packet by mouth daily.    [provider]    Allergies    Penicillins  Review of Systems   Review of Systems  Constitutional: Positive for activity change.  Eyes: Positive for visual disturbance. Negative for photophobia and redness.  Respiratory: Negative for shortness of breath.   Cardiovascular: Negative for chest pain.  Gastrointestinal: Negative for nausea and vomiting.  Neurological: Negative for dizziness, weakness and numbness.  All other systems reviewed and are negative.   Physical Exam Updated Vital Signs BP (!) 171/99 (BP Location: Left Arm)   Pulse 96   Temp 98 F (36.7 C) (Oral)   Resp 16   SpO2 100%   Physical Exam Vitals and nursing note reviewed.  Constitutional:      Appearance: He is well-developed.  HENT:       Head: Atraumatic.  Eyes:     Extraocular Movements: Extraocular movements intact.     Pupils: Pupils are equal, round, and reactive to light.  Cardiovascular:     Rate and Rhythm: Normal rate.  Pulmonary:     Effort: Pulmonary effort is normal.  Musculoskeletal:     Cervical back: Neck supple.  Skin:    General: Skin is warm.  Neurological:     Mental Status: He is alert and oriented to person, place, and time.     Comments: Cerebellar exam is normal (finger to nose) Sensory exam normal for bilateral upper and lower extremities - and patient is able to discriminate between sharp and dull. Motor exam is 4+/5      ED Results / Procedures / Treatments   Labs (all labs ordered are listed, but only abnormal results are displayed) Labs Reviewed  APTT - Abnormal; Notable for the following components:      Result Value   aPTT 45 (*)    All other components within normal limits  COMPREHENSIVE METABOLIC PANEL - Abnormal; Notable for the following components:   Glucose, Bld 120 (*)    All other components within normal limits  I-STAT CHEM 8, ED - Abnormal; Notable for the following components:   Glucose, Bld 116 (*)    All other components within normal limits  PROTIME-INR  CBC  DIFFERENTIAL    EKG None  Radiology CT Angio Head W or Wo Contrast  Result Date: 03/11/2019 CLINICAL DATA:  Right-sided vision changes. EXAM: CT ANGIOGRAPHY HEAD AND NECK TECHNIQUE: Multidetector CT imaging of the head and neck was performed using the standard protocol during bolus administration of intravenous contrast. Multiplanar CT image reconstructions and MIPs were obtained to evaluate the vascular anatomy. Carotid stenosis measurements (when applicable) are obtained utilizing NASCET criteria, using the distal internal carotid diameter as the denominator. CONTRAST:  OMNIPAQUE IOHEXOL 350 MG/ML SOLN COMPARISON:  None. FINDINGS: CT HEAD FINDINGS Brain: There is no mass, hemorrhage or  extra-axial collection. There is generalized atrophy without lobar predilection. There is no acute or chronic infarction. There is hypoattenuation of the periventricular white matter, most commonly indicating chronic ischemic microangiopathy. Skull: The visualized skull base, calvarium and extracranial soft tissues are normal. Sinuses/Orbits: No fluid levels or advanced mucosal thickening of the visualized paranasal sinuses. No mastoid or middle ear effusion. The orbits are normal. CTA NECK FINDINGS SKELETON: There is no bony spinal canal stenosis. No lytic or blastic lesion. OTHER NECK: Normal pharynx, larynx and major salivary glands. No cervical lymphadenopathy. Unremarkable thyroid gland. UPPER CHEST: No pneumothorax or pleural effusion. No nodules  or masses. AORTIC ARCH: There is no calcific atherosclerosis of the aortic arch. There is no aneurysm, dissection or hemodynamically significant stenosis of the visualized portion of the aorta. Conventional 3 vessel aortic branching pattern. The visualized proximal subclavian arteries are widely patent. RIGHT CAROTID SYSTEM: Normal without aneurysm, dissection or stenosis. LEFT CAROTID SYSTEM: Normal without aneurysm, dissection or stenosis. VERTEBRAL ARTERIES: Codominant configuration. Both origins are clearly patent. There is no dissection, occlusion or flow-limiting stenosis to the skull base (V1-V3 segments). CTA HEAD FINDINGS POSTERIOR CIRCULATION: --Vertebral arteries: Normal V4 segments. --Posterior inferior cerebellar arteries (PICA): Patent origins from the vertebral arteries. --Anterior inferior cerebellar arteries (AICA): Patent origins from the basilar artery. --Basilar artery: Normal. --Superior cerebellar arteries: Normal. --Posterior cerebral arteries: Normal. There are bilateral posterior communicating arteries (p-comm) that partially supply the PCAs. ANTERIOR CIRCULATION: --Intracranial internal carotid arteries: Normal. --Anterior cerebral arteries  (ACA): Normal. Both A1 segments are present. Patent anterior communicating artery (a-comm). --Middle cerebral arteries (MCA): Normal. VENOUS SINUSES: As permitted by contrast timing, patent. ANATOMIC VARIANTS: None Review of the MIP images confirms the above findings. IMPRESSION: 1. No emergent large vessel occlusion or hemodynamically significant stenosis of the head or neck. 2. Chronic ischemic microangiopathy and generalized atrophy. Electronically Signed   By: Deatra Robinson M.D.   On: 03/11/2019 21:30   CT Angio Neck W and/or Wo Contrast  Result Date: 03/11/2019 CLINICAL DATA:  Right-sided vision changes. EXAM: CT ANGIOGRAPHY HEAD AND NECK TECHNIQUE: Multidetector CT imaging of the head and neck was performed using the standard protocol during bolus administration of intravenous contrast. Multiplanar CT image reconstructions and MIPs were obtained to evaluate the vascular anatomy. Carotid stenosis measurements (when applicable) are obtained utilizing NASCET criteria, using the distal internal carotid diameter as the denominator. CONTRAST:  OMNIPAQUE IOHEXOL 350 MG/ML SOLN COMPARISON:  None. FINDINGS: CT HEAD FINDINGS Brain: There is no mass, hemorrhage or extra-axial collection. There is generalized atrophy without lobar predilection. There is no acute or chronic infarction. There is hypoattenuation of the periventricular white matter, most commonly indicating chronic ischemic microangiopathy. Skull: The visualized skull base, calvarium and extracranial soft tissues are normal. Sinuses/Orbits: No fluid levels or advanced mucosal thickening of the visualized paranasal sinuses. No mastoid or middle ear effusion. The orbits are normal. CTA NECK FINDINGS SKELETON: There is no bony spinal canal stenosis. No lytic or blastic lesion. OTHER NECK: Normal pharynx, larynx and major salivary glands. No cervical lymphadenopathy. Unremarkable thyroid gland. UPPER CHEST: No pneumothorax or pleural effusion. No  nodules or masses. AORTIC ARCH: There is no calcific atherosclerosis of the aortic arch. There is no aneurysm, dissection or hemodynamically significant stenosis of the visualized portion of the aorta. Conventional 3 vessel aortic branching pattern. The visualized proximal subclavian arteries are widely patent. RIGHT CAROTID SYSTEM: Normal without aneurysm, dissection or stenosis. LEFT CAROTID SYSTEM: Normal without aneurysm, dissection or stenosis. VERTEBRAL ARTERIES: Codominant configuration. Both origins are clearly patent. There is no dissection, occlusion or flow-limiting stenosis to the skull base (V1-V3 segments). CTA HEAD FINDINGS POSTERIOR CIRCULATION: --Vertebral arteries: Normal V4 segments. --Posterior inferior cerebellar arteries (PICA): Patent origins from the vertebral arteries. --Anterior inferior cerebellar arteries (AICA): Patent origins from the basilar artery. --Basilar artery: Normal. --Superior cerebellar arteries: Normal. --Posterior cerebral arteries: Normal. There are bilateral posterior communicating arteries (p-comm) that partially supply the PCAs. ANTERIOR CIRCULATION: --Intracranial internal carotid arteries: Normal. --Anterior cerebral arteries (ACA): Normal. Both A1 segments are present. Patent anterior communicating artery (a-comm). --Middle cerebral arteries (MCA): Normal. VENOUS SINUSES: As permitted  by contrast timing, patent. ANATOMIC VARIANTS: None Review of the MIP images confirms the above findings. IMPRESSION: 1. No emergent large vessel occlusion or hemodynamically significant stenosis of the head or neck. 2. Chronic ischemic microangiopathy and generalized atrophy. Electronically Signed   By: Deatra Robinson M.D.   On: 03/11/2019 21:30    Procedures Procedures (including critical care time)  Medications Ordered in ED Medications  sodium chloride flush (NS) 0.9 % injection 3 mL (has no administration in time range)  iohexol (OMNIPAQUE) 350 MG/ML injection 100 mL (100  mLs Intravenous Contrast Given 03/11/19 2059)    ED Course  I have reviewed the triage vital signs and the nursing notes.  Pertinent labs & imaging results that were available during my care of the patient were reviewed by me and considered in my medical decision making (see chart for details).    MDM Rules/Calculators/A&P                      69 year old with history of A. fib, diabetes but no history of CAD or stroke comes in a chief complaint of visual deficit.  His peripheral field exam and bedside visual acuity is normal besides the fact that he has a small area of vision loss in his left eye.  It does not appear to be even hemianopsia.  Given that he has history of A. fib there could be an embolic event that caused it.  Differential diagnosis includes CRA O, CRV O.  There is no eye pain which is reassuring.  No redness either.  We do not think there is glaucoma.  He is already on Eliquis.  There is no further medication optimization needed.  We will get CT angio head and neck which if negative we will discharge him with ophthalmology and neurology follow-up. I have discussed the case with Dr. Laurence Slate, neurology who agrees with the plan and has reviewed the medical record with me.   Final Clinical Impression(s) / ED Diagnoses Final diagnoses:  Visual disturbance  Atrial fibrillation, unspecified type [facility])    Rx / DC Orders ED Discharge Orders    None       Derwood Kaplan, MD 03/11/19 2255

## 2019-03-11 NOTE — Discharge Instructions (Signed)
Results in the ER are reassuring. Please follow-up with the neurologist as requested. Please also call the ophthalmologist to follow-up with them to ensure your eyes are doing well.  Return to the ER if your vision gets worse or you start developing new neurologic symptoms such as dizziness, slurred speech, one-sided weakness or numbness.

## 2019-03-11 NOTE — ED Triage Notes (Signed)
Friday-Saturday pt noticed a dark spot in his L peripheral vision field. He did not lose vision-thought it was an eyelash in his way. Pt wiped his eye and it did not go away. Called his PCP who advised to come to ED. No neuro deficits noted.

## 2019-03-13 DIAGNOSIS — H43812 Vitreous degeneration, left eye: Secondary | ICD-10-CM | POA: Diagnosis not present

## 2019-03-13 DIAGNOSIS — H2511 Age-related nuclear cataract, right eye: Secondary | ICD-10-CM | POA: Diagnosis not present

## 2019-03-13 DIAGNOSIS — H2512 Age-related nuclear cataract, left eye: Secondary | ICD-10-CM | POA: Diagnosis not present

## 2019-03-14 ENCOUNTER — Encounter: Payer: Self-pay | Admitting: Neurology

## 2019-03-14 ENCOUNTER — Ambulatory Visit: Payer: PPO | Admitting: Neurology

## 2019-03-14 ENCOUNTER — Other Ambulatory Visit: Payer: Self-pay

## 2019-03-14 VITALS — BP 150/87 | HR 69 | Temp 97.8°F | Ht 75.0 in | Wt 260.2 lb

## 2019-03-14 DIAGNOSIS — H539 Unspecified visual disturbance: Secondary | ICD-10-CM | POA: Diagnosis not present

## 2019-03-14 NOTE — Progress Notes (Signed)
Reason for visit: Subjective visual disturbance  Referring physician: [place]  Andrew Perkins is a 69 y.o. male  History of present illness:  Andrew Perkins is a 69 year old right-handed black male with a history of atrial fibrillation on anticoagulation.  The patient woke up on 10 March 2019 and noted a visual disturbance affecting the left eye only.  He had a crescent shaped darkness in the periphery of vision, he may occasionally have bright lights along the periphery as well.  He has had no pain in the eye or any headache.  He reports no other symptoms such as numbness or weakness of the face, arms, legs.  He has not had any slurred speech or difficulty swallowing.  He is certain that the visual disturbance only affects the left eye, when he covers the left eye, he sees no such vision disturbance in the right eye.  He was set up in the emergency room for CT angiogram of the head and neck and CT scan of the brain that was unremarkable.  The patient was set up to see ophthalmology, he was seen recently but he cannot remember the name of the physician.  They dilated the eyes and told him that he had "floaters".  They will be seeing him in follow-up in 2 weeks.  The patient was not found to have any intraocular abnormalities, no evidence of the retinal detachment.  The patient comes to this office for further evaluation.  Past Medical History:  Diagnosis Date  . Anemia    a. Mild per pt - previously on iron but did not tolerate.  . Atrial fibrillation (HCC)    a. Dx 03/2013 (but ~3 yr of palps). Spont converted to NSR.  [name] Chronic neck pain 06/27/2017  . Diabetes mellitus without complication (HCC)    borderline  . Erectile dysfunction   . Former consumption of alcohol    a. Abstinent as of 03/2013.  [name] Hx of echocardiogram    Echo (2/16):  Mod focal basal and mild concentric LVH, vigorous LVF, EF 65-70%, no RWMA, Gr 1 DD, trivial AI, severe LAE  . Hypertension   . Left atrial enlargement   . SVT  (supraventricular tachycardia) (HCC)     Past Surgical History:  Procedure Laterality Date  . CIRCUMCISION     at 69yrs old  . TONSILLECTOMY    . WISDOM TOOTH EXTRACTION      Family History  Problem Relation Age of Onset  . Diabetes Mellitus II Brother   . Asthma Brother   . CAD Neg Hx   . Stroke Neg Hx   . Heart attack Neg Hx     Social history:  reports that he has never smoked. He has never used smokeless tobacco. He reports that he does not drink alcohol or use drugs.  Medications:  Prior to Admission medications   Medication Sig Start Date End Date Taking? Authorizing Provider  apixaban (ELIQUIS) 5 MG TABS tablet Take 1 tablet (5 mg total) by mouth 2 (two) times daily. 05/28/18  Yes Newman Nip, NP  atorvastatin (LIPITOR) 20 MG tablet TAKE 1 TABLET BY MOUTH  DAILY AT 6 PM. 08/19/15  Yes Wendall Stade, MD  famotidine (PEPCID) 10 MG tablet Take 10 mg by mouth daily.   Yes [provider]  flecainide (TAMBOCOR) 100 MG tablet Take 1 tablet (100 mg total) by mouth 2 (two) times daily. 02/28/19  Yes Newman Nip, NP  losartan (COZAAR) 100 MG tablet  Take 100 mg by mouth daily.   Yes [provider]  metoprolol tartrate (LOPRESSOR) 25 MG tablet Take 1 tablet (25 mg total) by mouth 2 (two) times daily. 02/26/18  Yes Sherran Needs, NP  psyllium (HYDROCIL/METAMUCIL) 95 % PACK Take 1 packet by mouth daily.   Yes [provider]  Vitamin D, Ergocalciferol, (DRISDOL) 1.25 MG (50000 UNIT) CAPS capsule Take 50,000 Units by mouth once a week. 11/21/18  Yes [provider]  diclofenac sodium (VOLTAREN) 1 % GEL Apply 1 application topically daily as needed for pain. 03/01/16   [provider]      Allergies  Allergen Reactions  . Penicillins Other (See Comments)    syncope    ROS:  Out of a complete 14 system review of symptoms, the patient complains only of the following symptoms, and all other reviewed systems are negative.  Left eye  visual disturbance  Blood pressure (!) 150/87, pulse 69, temperature 97.8 F (36.6 C), height 6\' 3"  (1.905 m), weight 260 lb 3.2 oz (118 kg).  Physical Exam  General: The patient is alert and cooperative at the time of the examination.  No temporal tenderness is noted on either side.  Eyes: Pupils are equal, round, and reactive to light. Discs are flat bilaterally.  Neck: The neck is supple, no carotid bruits are noted.  Respiratory: The respiratory examination is clear.  Cardiovascular: The cardiovascular examination reveals a regular rate and rhythm, no obvious murmurs or rubs are noted.  Skin: Extremities are without significant edema.  Neurologic Exam  Mental status: The patient is alert and oriented x 3 at the time of the examination. The patient has apparent normal recent and remote memory, with an apparently normal attention span and concentration ability.  Cranial nerves: Facial symmetry is present. There is good sensation of the face to pinprick and soft touch bilaterally. The strength of the facial muscles and the muscles to head turning and shoulder shrug are normal bilaterally. Speech is well enunciated, no aphasia or dysarthria is noted. Extraocular movements are full. Visual fields are full. The tongue is midline, and the patient has symmetric elevation of the soft palate. No obvious hearing deficits are noted.  Motor: The motor testing reveals 5 over 5 strength of all 4 extremities. Good symmetric motor tone is noted throughout.  Sensory: Sensory testing is intact to pinprick, soft touch, vibration sensation, and position sense on all 4 extremities. No evidence of extinction is noted.  Coordination: Cerebellar testing reveals good finger-nose-finger and heel-to-shin bilaterally.  Gait and station: Gait is normal. Tandem gait is normal. Romberg is negative. No drift is seen.  Reflexes: Deep tendon reflexes are symmetric and normal bilaterally. Toes are downgoing  bilaterally.   Assessment/Plan:  1.  Left eye visual disturbance  The patient has a monocular visual event that does not appear to be typical for a floater.  The patient has had a full ophthalmologic evaluation, he will be following up with his ophthalmologist in the near future.  I will check a sedimentation rate and C-reactive protein today.  Otherwise, no further neurologic evaluation is indicated.  The monocular nature of the visual disturbance goes against a cerebrovascular event.  Jill Alexanders MD 03/14/2019 10:03 AM  [facility] [address] [place], [place] [address]  Phone [phone] Fax [phone]

## 2019-03-15 DIAGNOSIS — R635 Abnormal weight gain: Secondary | ICD-10-CM | POA: Diagnosis not present

## 2019-03-15 DIAGNOSIS — R0602 Shortness of breath: Secondary | ICD-10-CM | POA: Diagnosis not present

## 2019-03-15 DIAGNOSIS — R7309 Other abnormal glucose: Secondary | ICD-10-CM | POA: Diagnosis not present

## 2019-03-15 DIAGNOSIS — I480 Paroxysmal atrial fibrillation: Secondary | ICD-10-CM | POA: Diagnosis not present

## 2019-03-15 LAB — C-REACTIVE PROTEIN: CRP: 2 mg/L (ref 0–10)

## 2019-03-15 LAB — SEDIMENTATION RATE: Sed Rate: 45 mm/hr — ABNORMAL HIGH (ref 0–30)

## 2019-03-31 ENCOUNTER — Ambulatory Visit: Payer: PPO | Attending: Internal Medicine

## 2019-03-31 DIAGNOSIS — Z23 Encounter for immunization: Secondary | ICD-10-CM | POA: Insufficient documentation

## 2019-03-31 NOTE — Progress Notes (Signed)
   Covid-19 Vaccination Clinic  Name:  Cola Highfill    MRN: [id number] DOB: Oct 31, 1950  03/31/2019  Mr. Kneisel was observed post Covid-19 immunization for 15 minutes without incidence. He was provided with Vaccine Information Sheet and instruction to access the V-Safe system.   Mr. Mastel was instructed to call 911 with any severe reactions post vaccine: [name] Difficulty breathing  . Swelling of your face and throat  . A fast heartbeat  . A bad rash all over your body  . Dizziness and weakness    Immunizations Administered    Name Date Dose VIS Date Route   Pfizer COVID-19 Vaccine 03/31/2019  4:24 PM 0.3 mL 02/01/2019 Intramuscular   Manufacturer: [employer], [employer]   Lot: [id number]   NDC: [address]-5

## 2019-04-08 ENCOUNTER — Other Ambulatory Visit (HOSPITAL_COMMUNITY): Payer: Self-pay | Admitting: *Deleted

## 2019-04-08 MED ORDER — METOPROLOL TARTRATE 25 MG PO TABS: 25.0000 mg | ORAL_TABLET | Freq: Two times a day (BID) | ORAL | 3 refills | Status: DC

## 2019-04-10 DIAGNOSIS — H43812 Vitreous degeneration, left eye: Secondary | ICD-10-CM | POA: Diagnosis not present

## 2019-04-19 ENCOUNTER — Ambulatory Visit: Payer: PPO

## 2019-04-19 NOTE — Progress Notes (Signed)
Patient ID: Andrew Perkins, male   DOB: 1950/05/16, 69 y.o.   MRN: [id number]     Patient ID: Andrew Perkins, male   DOB: June 11, 1950, 69 y.o.   MRN: [id number]   Date:  04/24/2019   PCP:  London Pepper, MD  Cardiologist:  Dr Johnsie Cancel Primary Electrophysiologist: Thompson Grayer   No chief complaint on file.    History of Present Illness:  69 y.o. history of PAF. First noted 2015. Been on eliquis, metroprolol and flecainide with  Minimal afib burden Has seen Dr Rayann Heman and preferred not to have ablation. Also has narrow Complex SVT. CHADSVASC score 3. Last normal ETT 2016   Still with occasional break through Episodes don't last long and HR only in 90's so he does Not get dizzy now.   Prefers to stay on flecainide and not consider ablation still No missed doses of DOAC  March 2020 concern for dyspnea TTE done 05/01/18 normal RV/LV function mild MR  Seen in ED 03/11/19 with visual changes compliant with eliquis CTA head / neck with no  Stroke or large vessel carotid disease Told to f/u with eye doctor Left eye worse Seen in f/u by neurology  Ordered ESR which was elevated at 45 and normal C reactive protein ? F/u Seen by ophthalmology and had "floaters" but no glaucoma or retinal issues    Past Medical History:  Diagnosis Date  . Anemia    a. Mild per pt - previously on iron but did not tolerate.  . Atrial fibrillation ([place])    a. Dx 03/2013 (but ~3 yr of palps). Spont converted to NSR.  [name] Chronic neck pain 06/27/2017  . Diabetes mellitus without complication (HCC)    borderline  . Erectile dysfunction   . Former consumption of alcohol    a. Abstinent as of 03/2013.  [name] Hx of echocardiogram    Echo (2/16):  Mod focal basal and mild concentric LVH, vigorous LVF, EF 65-70%, no RWMA, Gr 1 DD, trivial AI, severe LAE  . Hypertension   . Left atrial enlargement   . SVT (supraventricular tachycardia) (HCC)    Past Surgical History:  Procedure Laterality Date  . CIRCUMCISION     at 69yr old   . TONSILLECTOMY    . WISDOM TOOTH EXTRACTION       Current Outpatient Medications  Medication Sig Dispense Refill  . apixaban (ELIQUIS) 5 MG TABS tablet Take 1 tablet (5 mg total) by mouth 2 (two) times daily. 180 tablet 2  . atorvastatin (LIPITOR) 20 MG tablet TAKE 1 TABLET BY MOUTH  DAILY AT 6 PM. 90 tablet 3  . diclofenac sodium (VOLTAREN) 1 % GEL Apply 1 application topically daily as needed for pain.    . famotidine (PEPCID) 10 MG tablet Take 10 mg by mouth daily.    . flecainide (TAMBOCOR) 100 MG tablet Take 1 tablet (100 mg total) by mouth 2 (two) times daily. 180 tablet 2  . losartan (COZAAR) 100 MG tablet Take 100 mg by mouth daily.    . metoprolol tartrate (LOPRESSOR) 25 MG tablet Take 1 tablet (25 mg total) by mouth 2 (two) times daily. 180 tablet 3  . psyllium (HYDROCIL/METAMUCIL) 95 % PACK Take 1 packet by mouth daily.     No current facility-administered medications for this visit.    Allergies:   Penicillins   Social History:  The patient  reports that he has never smoked. He has never used smokeless tobacco. He reports that he does not drink  alcohol or use drugs.   Family History:  The patient's  family history includes Asthma in his brother; Diabetes Mellitus II in his brother.    ROS:  Please see the history of present illness.   All other systems are reviewed and negative.    PHYSICAL EXAM: VS:  BP (!) 148/78   Pulse 67   Ht 6' 3" (1.905 m)   Wt 262 lb (118.8 kg)   SpO2 97%   BMI 32.75 kg/m  , BMI Body mass index is 32.75 kg/m. Affect appropriate Healthy:  appears stated age 49: normal Neck supple with no adenopathy JVP normal no bruits no thyromegaly Lungs clear with no wheezing and good diaphragmatic motion Heart:  S1/S2 no murmur, no rub, gallop or click PMI normal Abdomen: benighn, BS positve, no tenderness, no AAA no bruit.  No HSM or HJR Distal pulses intact with no bruits No edema Neuro non-focal Skin warm and dry No muscular weakness    EKG:  04/24/18 SR rate 57 PR 220 otherwise normal    Recent Labs: 03/11/2019: ALT 15; BUN 17; Creatinine, Ser 1.10; Hemoglobin 13.9; Platelets 219; Potassium 4.1; Sodium 140    Lipid Panel  No results found for: CHOL, TRIG, HDL, CHOLHDL, VLDL, LDLCALC, LDLDIRECT   Wt Readings from Last 3 Encounters:  04/24/19 262 lb (118.8 kg)  03/14/19 260 lb 3.2 oz (118 kg)  11/15/18 253 lb 3.2 oz (114.9 kg)      Other studies Reviewed: Additional studies/ records that were reviewed today include echo, labs, prior EKG's and Dr. Jackalyn Lombard mote.    ASSESSMENT AND PLAN:  1.  Paroxysmal atrial fibrillation/ SVT continue beta blocker , flecainide  Continue eliquis for chads2vasc score of at least 3.  Should he fail medical therapy with flecainide then he may be more willing to consider ablation. LA 53 mm on TTE done 05/01/18   2. HTN  Well controlled.  Continue current medications and low sodium Dash type diet.    3. Overweight discussed low carb diet   4. ED:  Ok to use vasodilators but will need primary to prescribe  5. Dyspnea:  Seems functional echo 05/01/18 EF normal just mild MR   6. Vision:  Seen in ER for visual changes CTA negative for stroke / carotid disease Had f/u with neurology and ophthalmology with ? catarcat f/u with neurology for mildly elevated ESR doubt temporal arteritis       Follow with  me in a year    Signed, Jenkins Rouge, MD  04/24/2019 10:43 AM

## 2019-04-23 ENCOUNTER — Other Ambulatory Visit (HOSPITAL_COMMUNITY): Payer: Self-pay | Admitting: *Deleted

## 2019-04-23 MED ORDER — APIXABAN 5 MG PO TABS: 5.0000 mg | ORAL_TABLET | Freq: Two times a day (BID) | ORAL | 2 refills | Status: DC

## 2019-04-24 ENCOUNTER — Ambulatory Visit: Payer: PPO | Admitting: Cardiovascular Disease

## 2019-04-24 ENCOUNTER — Other Ambulatory Visit: Payer: Self-pay

## 2019-04-24 ENCOUNTER — Ambulatory Visit: Payer: PPO | Attending: Internal Medicine

## 2019-04-24 ENCOUNTER — Ambulatory Visit: Payer: PPO

## 2019-04-24 ENCOUNTER — Encounter: Payer: Self-pay | Admitting: Cardiovascular Disease

## 2019-04-24 VITALS — BP 148/78 | HR 67 | Ht 75.0 in | Wt 262.0 lb

## 2019-04-24 DIAGNOSIS — I480 Paroxysmal atrial fibrillation: Secondary | ICD-10-CM

## 2019-04-24 DIAGNOSIS — Z23 Encounter for immunization: Secondary | ICD-10-CM | POA: Insufficient documentation

## 2019-04-24 NOTE — Progress Notes (Signed)
   Covid-19 Vaccination Clinic  Name:  Rogelio Waynick    MRN: [id number] DOB: [birth year]  04/24/2019  Mr. Fales was observed post Covid-19 immunization for 30 mins without incident. He was provided with Vaccine Information Sheet and instruction to access the V-Safe system.   Mr. Hoard was instructed to call 911 with any severe reactions post vaccine: [name] Difficulty breathing  . Swelling of face and throat  . A fast heartbeat  . A bad rash all over body  . Dizziness and weakness   Immunizations Administered    Name Date Dose VIS Date Route   Pfizer COVID-19 Vaccine 04/24/2019 11:29 AM 0.3 mL 02/01/2019 Intramuscular   Manufacturer: [employer], [employer]   Lot: [id number]   NDC: [address]-0

## 2019-04-24 NOTE — Patient Instructions (Addendum)

## 2019-05-15 DIAGNOSIS — E559 Vitamin D deficiency, unspecified: Secondary | ICD-10-CM | POA: Diagnosis not present

## 2019-05-15 DIAGNOSIS — R109 Unspecified abdominal pain: Secondary | ICD-10-CM | POA: Diagnosis not present

## 2019-05-15 DIAGNOSIS — R0602 Shortness of breath: Secondary | ICD-10-CM | POA: Diagnosis not present

## 2019-05-15 DIAGNOSIS — I480 Paroxysmal atrial fibrillation: Secondary | ICD-10-CM | POA: Diagnosis not present

## 2019-05-15 DIAGNOSIS — I10 Essential (primary) hypertension: Secondary | ICD-10-CM | POA: Diagnosis not present

## 2019-05-28 DIAGNOSIS — R12 Heartburn: Secondary | ICD-10-CM | POA: Diagnosis not present

## 2019-05-29 ENCOUNTER — Other Ambulatory Visit: Payer: Self-pay | Admitting: Gastroenterology

## 2019-05-29 DIAGNOSIS — R12 Heartburn: Secondary | ICD-10-CM

## 2019-06-03 ENCOUNTER — Other Ambulatory Visit (HOSPITAL_COMMUNITY): Payer: PPO

## 2019-06-05 ENCOUNTER — Encounter: Payer: Self-pay | Admitting: Critical Care Medicine

## 2019-06-05 ENCOUNTER — Ambulatory Visit: Payer: PPO | Admitting: Critical Care Medicine

## 2019-06-05 ENCOUNTER — Other Ambulatory Visit: Payer: Self-pay

## 2019-06-05 VITALS — BP 132/80 | HR 75 | Temp 98.0°F | Ht 75.0 in | Wt 257.6 lb

## 2019-06-05 DIAGNOSIS — R0600 Dyspnea, unspecified: Secondary | ICD-10-CM

## 2019-06-05 DIAGNOSIS — R0609 Other forms of dyspnea: Secondary | ICD-10-CM

## 2019-06-05 NOTE — Progress Notes (Signed)
Synopsis: Referred in April 2021 for SOB by Farris Has, MD.  Subjective:   PATIENT ID: Andrew Perkins GENDER: male DOB: Apr 05, 1950, MRN: [id number]  Chief Complaint  Patient presents with  . Consult    Patient has shortness of breath with exertion. Patient gets short of breath and tired doing duties around the house, taking out trash, etc. Patient has dry cough, denies wheezing.    Andrew Perkins is a 69 year old gentleman with a history of paroxysmal atrial fibrillation consider evaluation of shortness of breath.  He describes fatigue more than shortness of breath.  He feels like he gets tired easily, even doing household chores and taking out the trash.  He used to walk 30 minutes at a time for exercise and always had issues with hills, but during the winter was less active.  Currently he is not able to walk the way that he used to.  He has an occasional dry cough, no wheezing or sputum.  He has some sinus allergy symptoms recently.  No fever, chills, sweats.  No orthopnea, paroxysmal nocturnal dyspnea, lower extremity edema, chest pain.  Weights have been stable.  He has chronic palpitations with his A. fib, which he gets about 2 times a month for a few minutes.  He has heartburn that is better controlled on pantoprazole.  He occasionally has abdominal pain and indigestion when eating and is following up with GI.  There is no family history of lung disease.  He is a never smoker. Cardiology, follows with Dr. Eden Emms last visit 04/24/2019-,note reviewed.  Appeared clinically stable.  Stable dose of metoprolol, no new meds (flecainide, Eliquis, metoprolol). No record of amiodarone use in chart.  PCP- Dr. Vincente Liberty  05/15/19 note reviewed-was complaining of shortness of breath and was felt not to be his heart per cardiology.  Labs at that time and indicated mild normocytic anemia with hemoglobin 12.6, hematocrit 38.  He has a history of iron deficiency anemia in the past and was on iron supplements, which he  had stopped.  He was recently restarted on these based on these lab results.  He is up-to-date on flu, Covid, pneumonia vaccines.    Past Medical History:  Diagnosis Date  . Anemia    a. Mild per pt - previously on iron but did not tolerate.  . Atrial fibrillation (HCC)    a. Dx 03/2013 (but ~3 yr of palps). Spont converted to NSR.  [name] Chronic neck pain 06/27/2017  . Diabetes mellitus without complication (HCC)    borderline  . Erectile dysfunction   . Former consumption of alcohol    a. Abstinent as of 03/2013.  [name] Hx of echocardiogram    Echo (2/16):  Mod focal basal and mild concentric LVH, vigorous LVF, EF 65-70%, no RWMA, Gr 1 DD, trivial AI, severe LAE  . Hypertension   . Left atrial enlargement   . SVT (supraventricular tachycardia) (HCC)   . Vitamin D deficiency      Family History  Problem Relation Age of Onset  . Diabetes Mellitus II Brother   . Asthma Brother   . CAD Neg Hx   . Stroke Neg Hx   . Heart attack Neg Hx      Past Surgical History:  Procedure Laterality Date  . CIRCUMCISION     at 69yrs old  . TONSILLECTOMY    . WISDOM TOOTH EXTRACTION      Social History   Socioeconomic History  . Marital status: Single    Spouse  name: Not on file  . Number of children: Not on file  . Years of education: 43  . Highest education level: Not on file  Occupational History  . Occupation: Retired  Tobacco Use  . Smoking status: Never Smoker  . Smokeless tobacco: Never Used  Substance and Sexual Activity  . Alcohol use: No    Alcohol/week: 0.0 standard drinks  . Drug use: No  . Sexual activity: Not on file  Other Topics Concern  . Not on file  Social History Narrative   Pt lives in [place] alone.  Retired.   Caffeine use: none   Right handed    Social Determinants of Health   Financial Resource Strain:   . Difficulty of Paying Living Expenses:   Food Insecurity:   . Worried About Charity fundraiser in the Last Year:   . Arboriculturist in the  Last Year:   Transportation Needs:   . Film/video editor (Medical):   [name] Lack of Transportation (Non-Medical):   Physical Activity:   . Days of Exercise per Week:   . Minutes of Exercise per Session:   Stress:   . Feeling of Stress :   Social Connections:   . Frequency of Communication with Friends and Family:   . Frequency of Social Gatherings with Friends and Family:   . Attends Religious Services:   . Active Member of Clubs or Organizations:   . Attends Archivist Meetings:   [name] Marital Status:   Intimate Partner Violence:   . Fear of Current or Ex-Partner:   . Emotionally Abused:   [name] Physically Abused:   . Sexually Abused:      Allergies  Allergen Reactions  . Penicillins Other (See Comments)    syncope     Immunization History  Administered Date(s) Administered  . Fluad Quad(high Dose 65+) 11/05/2018  . Influenza, High Dose Seasonal PF 11/29/2016, 12/18/2017  . PFIZER SARS-COV-2 Vaccination 03/31/2019, 04/24/2019    Outpatient Medications Prior to Visit  Medication Sig Dispense Refill  . amLODipine (NORVASC) 5 MG tablet Take 5 mg by mouth daily.    [name] apixaban (ELIQUIS) 5 MG TABS tablet Take 1 tablet (5 mg total) by mouth 2 (two) times daily. 180 tablet 2  . atorvastatin (LIPITOR) 20 MG tablet TAKE 1 TABLET BY MOUTH  DAILY AT 6 PM. 90 tablet 3  . famotidine (PEPCID) 10 MG tablet Take 10 mg by mouth daily.    . Ferrous Sulfate (IRON PO) Take 1 tablet by mouth daily.    . flecainide (TAMBOCOR) 100 MG tablet Take 1 tablet (100 mg total) by mouth 2 (two) times daily. 180 tablet 2  . losartan (COZAAR) 100 MG tablet Take 100 mg by mouth daily.    . metoprolol tartrate (LOPRESSOR) 25 MG tablet Take 1 tablet (25 mg total) by mouth 2 (two) times daily. 180 tablet 3  . pantoprazole (PROTONIX) 40 MG tablet Take 40 mg by mouth daily.    . psyllium (HYDROCIL/METAMUCIL) 95 % PACK Take 1 packet by mouth daily.    . diclofenac sodium (VOLTAREN) 1 % GEL Apply 1  application topically daily as needed for pain.     No facility-administered medications prior to visit.    Review of Systems  Constitutional: Negative for chills, fever and weight loss.  HENT: Positive for congestion.   Respiratory: Negative for cough, sputum production and wheezing.   Cardiovascular: Negative for chest pain and leg swelling.  Gastrointestinal: Positive for  abdominal pain and heartburn. Negative for nausea and vomiting.  Genitourinary: Negative for hematuria.  Musculoskeletal: Negative for joint pain and myalgias.  Skin: Negative for rash.  Neurological: Negative for dizziness and focal weakness.  Endo/Heme/Allergies: Positive for environmental allergies.     Objective:   Vitals:   06/05/19 1604 06/05/19 1605  BP:  132/80  Pulse: 75   Temp:  98 F (36.7 C)  TempSrc:  Temporal  SpO2: 96%   Weight:  257 lb 9.6 oz (116.8 kg)  Height:  6\' 3"  (1.905 m)   96% on   RA BMI Readings from Last 3 Encounters:  06/05/19 32.20 kg/m  04/24/19 32.75 kg/m  03/14/19 32.52 kg/m   Wt Readings from Last 3 Encounters:  06/05/19 257 lb 9.6 oz (116.8 kg)  04/24/19 262 lb (118.8 kg)  03/14/19 260 lb 3.2 oz (118 kg)    Physical Exam Vitals reviewed.  Constitutional:      Appearance: He is not ill-appearing.  HENT:     Head: Normocephalic and atraumatic.  Eyes:     General: No scleral icterus. Cardiovascular:     Rate and Rhythm: Normal rate and regular rhythm.     Heart sounds: No murmur.  Pulmonary:     Comments: Breathing comfortably on room air, no conversational dyspnea.  Clear to auscultation bilaterally. Abdominal:     General: There is no distension.     Palpations: Abdomen is soft.     Tenderness: There is no abdominal tenderness.  Musculoskeletal:        General: No swelling or deformity.     Cervical back: Neck supple.  Lymphadenopathy:     Cervical: No cervical adenopathy.  Skin:    General: Skin is warm and dry.  Neurological:     General:  No focal deficit present.     Mental Status: He is alert.     Coordination: Coordination normal.  Psychiatric:        Mood and Affect: Mood normal.        Behavior: Behavior normal.      CBC    Component Value Date/Time   WBC 7.3 03/11/2019 1817   RBC 4.69 03/11/2019 1817   HGB 13.9 03/11/2019 1840   HCT 41.0 03/11/2019 1840   PLT 219 03/11/2019 1817   MCV 88.3 03/11/2019 1817   MCH 28.8 03/11/2019 1817   MCHC 32.6 03/11/2019 1817   RDW 14.7 03/11/2019 1817   LYMPHSABS 2.5 03/11/2019 1817   MONOABS 0.8 03/11/2019 1817   EOSABS 0.5 03/11/2019 1817   BASOSABS 0.0 03/11/2019 1817   05/15/2019 labs with PCP: WBC 5.5 H/H 12.6/38.0 Platelets 209 Eosinophils 9.1% (50)  CHEMISTRY No results for input(s): NA, K, CL, CO2, GLUCOSE, BUN, CREATININE, CALCIUM, MG, PHOS in the last 168 hours. CrCl cannot be calculated (Patient's most recent lab result is older than the maximum 21 days allowed.).   Chest Imaging- films reviewed: CXR, 2 view 04/26/2018-low lung volumes, cardiomegaly with prominent right heart.  Mild atelectasis left base.  Pulmonary Functions Testing Results: No flowsheet data found.   Echocardiogram 05/01/2018: LVEF 55 to 60%, diastolic dysfunction present.  Moderately dilated LA, normal RV.  Mild AR, otherwise normal valves.      Assessment & Plan:     ICD-10-CM   1. DOE (dyspnea on exertion)  R06.00 Pulmonary function test    Fatigue and dyspnea on exertion. It seems that fatigue is his more predominant symptom.  Anemia could explain both of these. -PFTs -Agree  with iron supplementation -Continue follow-up with GI -Up-to-date on flu, Covid, pneumonia vaccines.  RTC in about 1 month after PFTs.     Current Outpatient Medications:  .  amLODipine (NORVASC) 5 MG tablet, Take 5 mg by mouth daily., Disp: , Rfl:  .  apixaban (ELIQUIS) 5 MG TABS tablet, Take 1 tablet (5 mg total) by mouth 2 (two) times daily., Disp: 180 tablet, Rfl: 2 .  atorvastatin  (LIPITOR) 20 MG tablet, TAKE 1 TABLET BY MOUTH  DAILY AT 6 PM., Disp: 90 tablet, Rfl: 3 .  famotidine (PEPCID) 10 MG tablet, Take 10 mg by mouth daily., Disp: , Rfl:  .  Ferrous Sulfate (IRON PO), Take 1 tablet by mouth daily., Disp: , Rfl:  .  flecainide (TAMBOCOR) 100 MG tablet, Take 1 tablet (100 mg total) by mouth 2 (two) times daily., Disp: 180 tablet, Rfl: 2 .  losartan (COZAAR) 100 MG tablet, Take 100 mg by mouth daily., Disp: , Rfl:  .  metoprolol tartrate (LOPRESSOR) 25 MG tablet, Take 1 tablet (25 mg total) by mouth 2 (two) times daily., Disp: 180 tablet, Rfl: 3 .  pantoprazole (PROTONIX) 40 MG tablet, Take 40 mg by mouth daily., Disp: , Rfl:  .  psyllium (HYDROCIL/METAMUCIL) 95 % PACK, Take 1 packet by mouth daily., Disp: , Rfl:  .  diclofenac sodium (VOLTAREN) 1 % GEL, Apply 1 application topically daily as needed for pain., Disp: , Rfl:     Steffanie Dunn, DO [facility] 06/05/2019 4:28 PM

## 2019-06-05 NOTE — Patient Instructions (Addendum)
Thank you for visiting Dr. Chestine Spore at [facility]. We recommend the following: Orders Placed This Encounter  Procedures  . Pulmonary function test   Orders Placed This Encounter  Procedures  . Pulmonary function test    Standing Status:   Future    Standing Expiration Date:   06/04/2020    Order Specific Question:   Where should this test be performed?    Answer:   [facility]    Order Specific Question:   Full PFT: includes the following: basic spirometry, spirometry pre & post bronchodilator, diffusion capacity (DLCO), lung volumes    Answer:   Full PFT      Return in about 1 month (around 07/05/2019).    Please do your part to reduce the spread of COVID-19.

## 2019-06-06 ENCOUNTER — Other Ambulatory Visit: Payer: PPO

## 2019-06-07 ENCOUNTER — Ambulatory Visit
Admission: RE | Admit: 2019-06-07 | Discharge: 2019-06-07 | Disposition: A | Discharge status: Home / Self Care | Payer: PPO | Source: Ambulatory Visit | Attending: Gastroenterology | Admitting: Gastroenterology

## 2019-06-07 DIAGNOSIS — R12 Heartburn: Secondary | ICD-10-CM

## 2019-06-07 DIAGNOSIS — R1013 Epigastric pain: Secondary | ICD-10-CM | POA: Diagnosis not present

## 2019-06-21 DIAGNOSIS — D649 Anemia, unspecified: Secondary | ICD-10-CM | POA: Diagnosis not present

## 2019-07-09 ENCOUNTER — Other Ambulatory Visit (HOSPITAL_COMMUNITY)
Admission: RE | Admit: 2019-07-09 | Discharge: 2019-07-09 | Disposition: A | Discharge status: Home / Self Care | Payer: PPO | Source: Ambulatory Visit | Attending: Pulmonary Disease | Admitting: Pulmonary Disease

## 2019-07-09 DIAGNOSIS — Z01812 Encounter for preprocedural laboratory examination: Secondary | ICD-10-CM | POA: Insufficient documentation

## 2019-07-09 DIAGNOSIS — Z20822 Contact with and (suspected) exposure to covid-19: Secondary | ICD-10-CM | POA: Diagnosis not present

## 2019-07-09 LAB — SARS CORONAVIRUS 2 (TAT 6-24 HRS): SARS Coronavirus 2: NEGATIVE

## 2019-07-11 NOTE — Progress Notes (Signed)
@Patient  ID: , male    DOB: [birth year], 69 y.o.   MRN: [id number]  Chief Complaint  Patient presents with  . Follow-up    PFT    Referring provider: [id number], MD  HPI:  69 year old male never smoker followed in our office for dyspnea on exertion  PMH: A. fib (on flecainide), hypertension Smoker/ Smoking History: Never smoker Maintenance: None Pt of: Dr. 62  07/12/2019  - Visit   69 year old male never smoker followed in our office for dyspnea on exertion.  Patient was last seen by Dr. 62.  Patient presenting today after completing pulmonary function testing.  Those results are listed below:  07/12/2019-pulmonary function test-FVC 2.77 (58% predicted), postbronchodilator ratio 74, postbronchodilator FEV1 2.42 (66% predicted), no bronchodilator response, mid flow reversibility, DLCO 29.69 (96% predicted)  Patient reports that he continues to have episodes of dyspnea on exertion as well as an ongoing dry cough which has had for over a year.  He does have a history of nasal congestion.  Lab work from 2021 does show peripheral eosinophilia.  Patient denies history of childhood asthma.  He denies seasonal issues with his breathing.  Patient reports has been maintained on flecainide since 2016.  He has never been on an inhaler before.  Tolerated walk in office without any oxygen desaturations.   Tests:   05/15/2019 labs with PCP: WBC 5.5 H/H 12.6/38.0 Platelets 209 Eosinophils 9.1% (50)  03/11/2019-differential-eosinophils relative 7, eosinophils absolute 0.5   Chest Imaging CXR, 2 view 04/26/2018-low lung volumes, cardiomegaly with prominent right heart.  Mild atelectasis left base.   Echocardiogram 05/01/2018: LVEF 55 to 60%, diastolic dysfunction present.  Moderately dilated LA, normal RV.  Mild AR, otherwise normal valves.   FENO:  No results found for: NITRICOXIDE  PFT: PFT Results Latest Ref Rng & Units 07/12/2019  FVC-Pre L 2.77  FVC-Predicted  Pre % 58  FVC-Post L 3.25  FVC-Predicted Post % 68  Pre FEV1/FVC % % 74  Post FEV1/FCV % % 74  FEV1-Pre L 2.04  FEV1-Predicted Pre % 56  FEV1-Post L 2.42  DLCO UNC% % 96  DLCO COR %Predicted % 152  TLC L 5.85  TLC % Predicted % 70  RV % Predicted % 117    WALK:  SIX MIN WALK 07/12/2019  Supplimental Oxygen during Test? (L/min) No  Tech Comments: moderate pace walk with no SOB    Imaging: No results found.  Lab Results:  CBC    Component Value Date/Time   WBC 7.3 03/11/2019 1817   RBC 4.69 03/11/2019 1817   HGB 13.9 03/11/2019 1840   HCT 41.0 03/11/2019 1840   PLT 219 03/11/2019 1817   MCV 88.3 03/11/2019 1817   MCH 28.8 03/11/2019 1817   MCHC 32.6 03/11/2019 1817   RDW 14.7 03/11/2019 1817   LYMPHSABS 2.5 03/11/2019 1817   MONOABS 0.8 03/11/2019 1817   EOSABS 0.5 03/11/2019 1817   BASOSABS 0.0 03/11/2019 1817    BMET    Component Value Date/Time   NA 140 03/11/2019 1840   K 4.1 03/11/2019 1840   CL 107 03/11/2019 1840   CO2 22 03/11/2019 1817   GLUCOSE 116 (H) 03/11/2019 1840   BUN 17 03/11/2019 1840   CREATININE 1.10 03/11/2019 1840   CALCIUM 9.1 03/11/2019 1817   GFRNONAA >60 03/11/2019 1817   GFRAA >60 03/11/2019 1817    BNP No results found for: BNP  ProBNP No results found for: PROBNP  Specialty Problems  Pulmonary Problems   DOE (dyspnea on exertion)      Allergies  Allergen Reactions  . Penicillins Other (See Comments)    syncope    Immunization History  Administered Date(s) Administered  . Fluad Quad(high Dose 65+) 11/05/2018  . Influenza, High Dose Seasonal PF 11/29/2016, 12/18/2017  . PFIZER SARS-COV-2 Vaccination 03/31/2019, 04/24/2019    Past Medical History:  Diagnosis Date  . Anemia    a. Mild per pt - previously on iron but did not tolerate.  . Atrial fibrillation (HCC)    a. Dx 03/2013 (but ~3 yr of palps). Spont converted to NSR.  [name] Chronic neck pain 06/27/2017  . Diabetes mellitus without complication (HCC)     borderline  . Erectile dysfunction   . Former consumption of alcohol    a. Abstinent as of 03/2013.  [name] Hx of echocardiogram    Echo (2/16):  Mod focal basal and mild concentric LVH, vigorous LVF, EF 65-70%, no RWMA, Gr 1 DD, trivial AI, severe LAE  . Hypertension   . Left atrial enlargement   . SVT (supraventricular tachycardia) (HCC)   . Vitamin D deficiency     Tobacco History: Social History   Tobacco Use  Smoking Status Never Smoker  Smokeless Tobacco Never Used   Counseling given: Not Answered   Continue to not smoke  Outpatient Encounter Medications as of 07/12/2019  Medication Sig  . amLODipine (NORVASC) 5 MG tablet Take 5 mg by mouth daily.  [name] apixaban (ELIQUIS) 5 MG TABS tablet Take 1 tablet (5 mg total) by mouth 2 (two) times daily.  [name] atorvastatin (LIPITOR) 20 MG tablet TAKE 1 TABLET BY MOUTH  DAILY AT 6 PM.  . diclofenac sodium (VOLTAREN) 1 % GEL Apply 1 application topically daily as needed for pain.  . famotidine (PEPCID) 10 MG tablet Take 10 mg by mouth daily.  . Ferrous Sulfate (IRON PO) Take 1 tablet by mouth daily.  . flecainide (TAMBOCOR) 100 MG tablet Take 1 tablet (100 mg total) by mouth 2 (two) times daily.  [name] losartan (COZAAR) 100 MG tablet Take 100 mg by mouth daily.  . metoprolol tartrate (LOPRESSOR) 25 MG tablet Take 1 tablet (25 mg total) by mouth 2 (two) times daily.  . pantoprazole (PROTONIX) 40 MG tablet Take 40 mg by mouth daily.  . psyllium (HYDROCIL/METAMUCIL) 95 % PACK Take 1 packet by mouth daily.  . fluticasone furoate-vilanterol (BREO ELLIPTA) 100-25 MCG/INH AEPB Inhale 1 puff into the lungs daily.   No facility-administered encounter medications on file as of 07/12/2019.     Review of Systems  Review of Systems  Constitutional: Negative for activity change, chills, fatigue, fever and unexpected weight change.  HENT: Positive for congestion. Negative for postnasal drip, rhinorrhea, sinus pressure, sinus pain and sore throat.   Eyes:  Negative.   Respiratory: Positive for shortness of breath. Negative for cough and wheezing.   Cardiovascular: Negative for chest pain and palpitations.  Gastrointestinal: Negative for constipation, diarrhea, nausea and vomiting.  Endocrine: Negative.   Genitourinary: Negative.   Musculoskeletal: Negative.   Skin: Negative.   Neurological: Negative for dizziness and headaches.  Psychiatric/Behavioral: Negative.  Negative for dysphoric mood. The patient is not nervous/anxious.   All other systems reviewed and are negative.    Physical Exam  BP 134/80 (BP Location: Left Arm, Cuff Size: Normal)   Pulse 70   Temp 98.1 F (36.7 C) (Temporal)   Ht 6\' 3"  (1.905 m)   Wt 262 lb (118.8 kg)  SpO2 97% Comment: RA  BMI 32.75 kg/m   Wt Readings from Last 5 Encounters:  07/12/19 262 lb (118.8 kg)  06/05/19 257 lb 9.6 oz (116.8 kg)  04/24/19 262 lb (118.8 kg)  03/14/19 260 lb 3.2 oz (118 kg)  11/15/18 253 lb 3.2 oz (114.9 kg)    BMI Readings from Last 5 Encounters:  07/12/19 32.75 kg/m  06/05/19 32.20 kg/m  04/24/19 32.75 kg/m  03/14/19 32.52 kg/m  11/15/18 31.65 kg/m     Physical Exam Vitals and nursing note reviewed.  Constitutional:      General: He is not in acute distress.    Appearance: Normal appearance. He is obese.  HENT:     Head: Normocephalic and atraumatic.     Right Ear: Hearing, tympanic membrane, ear canal and external ear normal. There is no impacted cerumen.     Left Ear: Hearing, tympanic membrane, ear canal and external ear normal. There is no impacted cerumen.     Nose: Nose normal. No mucosal edema, congestion or rhinorrhea.     Right Turbinates: Not enlarged.     Left Turbinates: Not enlarged.     Mouth/Throat:     Mouth: Mucous membranes are dry.     Pharynx: Oropharynx is clear. No oropharyngeal exudate.  Eyes:     Pupils: Pupils are equal, round, and reactive to light.  Cardiovascular:     Rate and Rhythm: Normal rate and regular rhythm.      Pulses: Normal pulses.     Heart sounds: Normal heart sounds. No murmur.  Pulmonary:     Effort: Pulmonary effort is normal.     Breath sounds: No decreased breath sounds, wheezing or rales.  Musculoskeletal:     Cervical back: Normal range of motion.     Right lower leg: No edema.     Left lower leg: No edema.  Lymphadenopathy:     Cervical: No cervical adenopathy.  Skin:    General: Skin is warm and dry.     Capillary Refill: Capillary refill takes less than 2 seconds.     Findings: No erythema or rash.  Neurological:     General: No focal deficit present.     Mental Status: He is alert and oriented to person, place, and time.     Motor: No weakness.     Coordination: Coordination normal.     Gait: Gait is intact. Gait (Tolerated walk in office) normal.  Psychiatric:        Mood and Affect: Mood normal.        Behavior: Behavior normal. Behavior is cooperative.        Thought Content: Thought content normal.        Judgment: Judgment normal.       Assessment & Plan:   Atrial fibrillation Normal sinus rhythm on exam today based off of clinical eval  Plan: Continue medications as managed by cardiology  DOE (dyspnea on exertion) Suspect the dyspnea on exertion could be multifactorial: Obesity, physical deconditioning, restrictive lung disease on pulmonary function testing, positive bronchodilator response and peripheral eosinophilia could indicate aspect of asthma  Plan: Lab work IgE today Chest x-ray today Trial of Breo Ellipta 100    Return in about 3 months (around 10/12/2019), or if symptoms worsen or fail to improve, for Follow up with Dr. Carlis Abbott.   Lauraine Rinne, NP 07/12/2019   This appointment required 32 minutes of patient care (this includes precharting, chart review, review of results, face-to-face care, etc.).

## 2019-07-12 ENCOUNTER — Other Ambulatory Visit: Payer: Self-pay

## 2019-07-12 ENCOUNTER — Ambulatory Visit (INDEPENDENT_AMBULATORY_CARE_PROVIDER_SITE_OTHER)
Admission: RE | Admit: 2019-07-12 | Discharge: 2019-07-12 | Disposition: A | Discharge status: Home / Self Care | Payer: PPO | Source: Ambulatory Visit | Attending: Pulmonary Disease | Admitting: Pulmonary Disease

## 2019-07-12 ENCOUNTER — Ambulatory Visit: Payer: PPO | Admitting: Pulmonary Disease

## 2019-07-12 ENCOUNTER — Ambulatory Visit (INDEPENDENT_AMBULATORY_CARE_PROVIDER_SITE_OTHER): Payer: PPO | Admitting: Critical Care Medicine

## 2019-07-12 ENCOUNTER — Encounter: Payer: Self-pay | Admitting: Pulmonary Disease

## 2019-07-12 VITALS — BP 134/80 | HR 70 | Temp 98.1°F | Ht 75.0 in | Wt 262.0 lb

## 2019-07-12 DIAGNOSIS — R0609 Other forms of dyspnea: Secondary | ICD-10-CM

## 2019-07-12 DIAGNOSIS — R0600 Dyspnea, unspecified: Secondary | ICD-10-CM | POA: Diagnosis not present

## 2019-07-12 DIAGNOSIS — R0602 Shortness of breath: Secondary | ICD-10-CM | POA: Diagnosis not present

## 2019-07-12 DIAGNOSIS — I480 Paroxysmal atrial fibrillation: Secondary | ICD-10-CM | POA: Diagnosis not present

## 2019-07-12 LAB — PULMONARY FUNCTION TEST
DL/VA % pred: 152 %
DL/VA: 5.98 ml/min/mmHg/L
DLCO cor % pred: 96 %
DLCO cor: 29.69 ml/min/mmHg
DLCO unc % pred: 96 %
DLCO unc: 29.69 ml/min/mmHg
FEF 25-75 Post: 2.9 L/sec
FEF 25-75 Pre: 1.63 L/sec
FEF2575-%Change-Post: 78 %
FEF2575-%Pred-Post: 95 %
FEF2575-%Pred-Pre: 53 %
FEV1-%Change-Post: 18 %
FEV1-%Pred-Post: 66 %
FEV1-%Pred-Pre: 56 %
FEV1-Post: 2.42 L
FEV1-Pre: 2.04 L
FEV1FVC-%Change-Post: 0 %
FEV1FVC-%Pred-Pre: 96 %
FEV6-%Change-Post: 22 %
FEV6-%Pred-Post: 71 %
FEV6-%Pred-Pre: 58 %
FEV6-Post: 3.25 L
FEV6-Pre: 2.66 L
FEV6FVC-%Pred-Post: 104 %
FEV6FVC-%Pred-Pre: 104 %
FVC-%Change-Post: 17 %
FVC-%Pred-Post: 68 %
FVC-%Pred-Pre: 58 %
FVC-Post: 3.25 L
FVC-Pre: 2.77 L
Post FEV1/FVC ratio: 74 %
Post FEV6/FVC ratio: 100 %
Pre FEV1/FVC ratio: 74 %
Pre FEV6/FVC Ratio: 100 %
RV % pred: 117 %
RV: 3.29 L
TLC % pred: 70 %
TLC: 5.85 L

## 2019-07-12 MED ORDER — BREO ELLIPTA 100-25 MCG/INH IN AEPB: 1.0000 | INHALATION_SPRAY | Freq: Every day | RESPIRATORY_TRACT | 0 refills | Status: DC

## 2019-07-12 NOTE — Patient Instructions (Addendum)
You were seen today by Lauraine Rinne, NP  for:   Pleasure meeting you today.  We reviewed your pulmonary function test which showed that you did have some difficulty taking a full deep breath in.  You also responded to the albuterol inhaler which we gave you.  I believe that is reasonable enough for Andrew Perkins to trial you on a inhaler called Breo Ellipta 100.  We have provided a sample for you of this.  Please take it every day for the next 14 days.  Please let Andrew Perkins know if you feel like this helps you with your breathing.  If so we will send in a prescription for you.  I would like to check a chest x-ray on you as well as one lab test.  The lab work will check for any sort of allergy markers.  You do have an elevated eosinophil count which is an allergy marker from your January/2021 lab work.  We will see you back in 3 months or sooner if you need it,  Take care and stay safe,  Andrew Perkins  1. DOE (dyspnea on exertion)  - DG Chest 2 View; Future - IgE; Future  Walk today in office  Trial of Breo Ellipta 100 >>> Take 1 puff daily in the morning right when you wake up >>>Rinse your mouth out after use >>>This is a daily maintenance inhaler, NOT a rescue inhaler >>>Contact our office if you are having difficulties affording or obtaining this medication >>>It is important for you to be able to take this daily and not miss any doses   >>> If you enjoy taking this inhaler and you feel that this is helped you with your breathing please contact her office and let Andrew Perkins know when you finish it so we can send in a prescription.  Lab work today Chest x-ray today  [facility] [employer].   Continue to work with primary care for iron supplementation  2. Paroxysmal atrial fibrillation (HCC)  Continue follow-up with cardiology Continue medications as managed by cardiology   We recommend today:  Orders Placed This Encounter  Procedures  . DG Chest 2 View    Standing Status:   Future    Standing Expiration  Date:   11/12/2019    Order Specific Question:   Reason for Exam (SYMPTOM  OR DIAGNOSIS REQUIRED)    Answer:   doe    Order Specific Question:   Preferred imaging location?    Answer:   [facility]    Order Specific Question:   Radiology Contrast Protocol - do NOT remove file path    Answer:   [email or web address]  . IgE    Standing Status:   Future    Standing Expiration Date:   07/11/2020   Orders Placed This Encounter  Procedures  . DG Chest 2 View  . IgE   No orders of the defined types were placed in this encounter.   Follow Up:    Return in about 3 months (around 10/12/2019), or if symptoms worsen or fail to improve, for Follow up with Dr. Carlis Abbott.   Please do your part to reduce the spread of COVID-19:      Reduce your risk of any infection  and COVID19 by using the similar precautions used for avoiding the common cold or flu:  [name] Wash your hands often with soap and warm water for at least 20 seconds.  If soap and water are not readily available, use an alcohol-based hand sanitizer  with at least 60% alcohol.  . If coughing or sneezing, cover your mouth and nose by coughing or sneezing into the elbow areas of your shirt or coat, into a tissue or into your sleeve (not your hands). Drinda Butts A MASK when in public  . Avoid shaking hands with others and consider head nods or verbal greetings only. . Avoid touching your eyes, nose, or mouth with unwashed hands.  . Avoid close contact with people who are sick. . Avoid places or events with large numbers of people in one location, like concerts or sporting events. . If you have some symptoms but not all symptoms, continue to monitor at home and seek medical attention if your symptoms worsen. . If you are having a medical emergency, call 911.   ADDITIONAL HEALTHCARE OPTIONS FOR PATIENTS  [facility] / e-Visit: [email or web address]         [facility]: [phone]  [facility]: [phone]                   [facility]: [phone]     It is flu season:   >>> Best ways to protect herself from the flu: Receive the yearly flu vaccine, practice good hand hygiene washing with soap and also using hand sanitizer when available, eat a nutritious meals, get adequate rest, hydrate appropriately   Please contact the office if your symptoms worsen or you have concerns that you are not improving.   Thank you for choosing [facility] for your healthcare, and for allowing Andrew Perkins to partner with you on your healthcare journey. I am thankful to be able to provide care to you today.   Elisha Headland FNP-C

## 2019-07-12 NOTE — Progress Notes (Signed)
Full PFT performed today. °

## 2019-07-12 NOTE — Assessment & Plan Note (Signed)
Normal sinus rhythm on exam today based off of clinical eval  Plan: Continue medications as managed by cardiology

## 2019-07-12 NOTE — Assessment & Plan Note (Signed)
Suspect the dyspnea on exertion could be multifactorial: Obesity, physical deconditioning, restrictive lung disease on pulmonary function testing, positive bronchodilator response and peripheral eosinophilia could indicate aspect of asthma  Plan: Lab work IgE today Chest x-ray today Trial of Breo Ellipta 100

## 2019-07-15 NOTE — Progress Notes (Signed)
SARS-CoV-2 test is negative.  This is good news.  Proceed forward with work-up as planned.  Bruin Bolger, FNP 

## 2019-07-23 DIAGNOSIS — K219 Gastro-esophageal reflux disease without esophagitis: Secondary | ICD-10-CM | POA: Diagnosis not present

## 2019-07-23 DIAGNOSIS — K5900 Constipation, unspecified: Secondary | ICD-10-CM | POA: Diagnosis not present

## 2019-08-22 DIAGNOSIS — I4891 Unspecified atrial fibrillation: Secondary | ICD-10-CM | POA: Diagnosis not present

## 2019-08-22 DIAGNOSIS — R7309 Other abnormal glucose: Secondary | ICD-10-CM | POA: Diagnosis not present

## 2019-08-22 DIAGNOSIS — I10 Essential (primary) hypertension: Secondary | ICD-10-CM | POA: Diagnosis not present

## 2019-08-22 DIAGNOSIS — D649 Anemia, unspecified: Secondary | ICD-10-CM | POA: Diagnosis not present

## 2019-08-22 DIAGNOSIS — E559 Vitamin D deficiency, unspecified: Secondary | ICD-10-CM | POA: Diagnosis not present

## 2019-10-21 ENCOUNTER — Ambulatory Visit: Payer: PPO | Admitting: Pulmonary Disease

## 2019-11-12 DIAGNOSIS — Z23 Encounter for immunization: Secondary | ICD-10-CM | POA: Diagnosis not present

## 2019-11-12 DIAGNOSIS — L819 Disorder of pigmentation, unspecified: Secondary | ICD-10-CM | POA: Diagnosis not present

## 2019-11-18 ENCOUNTER — Other Ambulatory Visit: Payer: Self-pay

## 2019-11-18 ENCOUNTER — Ambulatory Visit (HOSPITAL_COMMUNITY)
Admission: RE | Admit: 2019-11-18 | Discharge: 2019-11-18 | Disposition: A | Discharge status: Home / Self Care | Payer: PPO | Source: Ambulatory Visit | Attending: Nurse Practitioner | Admitting: Nurse Practitioner

## 2019-11-18 ENCOUNTER — Encounter (HOSPITAL_COMMUNITY): Payer: Self-pay | Admitting: Nurse Practitioner

## 2019-11-18 VITALS — BP 128/76 | HR 57 | Ht 75.0 in | Wt 266.4 lb

## 2019-11-18 DIAGNOSIS — I471 Supraventricular tachycardia: Secondary | ICD-10-CM | POA: Insufficient documentation

## 2019-11-18 DIAGNOSIS — Z6833 Body mass index (BMI) 33.0-33.9, adult: Secondary | ICD-10-CM | POA: Diagnosis not present

## 2019-11-18 DIAGNOSIS — D649 Anemia, unspecified: Secondary | ICD-10-CM | POA: Diagnosis not present

## 2019-11-18 DIAGNOSIS — I10 Essential (primary) hypertension: Secondary | ICD-10-CM | POA: Insufficient documentation

## 2019-11-18 DIAGNOSIS — E663 Overweight: Secondary | ICD-10-CM | POA: Insufficient documentation

## 2019-11-18 DIAGNOSIS — D6869 Other thrombophilia: Secondary | ICD-10-CM | POA: Diagnosis not present

## 2019-11-18 DIAGNOSIS — I480 Paroxysmal atrial fibrillation: Secondary | ICD-10-CM | POA: Diagnosis not present

## 2019-11-18 DIAGNOSIS — E119 Type 2 diabetes mellitus without complications: Secondary | ICD-10-CM | POA: Diagnosis not present

## 2019-11-18 DIAGNOSIS — Z79899 Other long term (current) drug therapy: Secondary | ICD-10-CM | POA: Diagnosis not present

## 2019-11-18 DIAGNOSIS — Z7901 Long term (current) use of anticoagulants: Secondary | ICD-10-CM | POA: Diagnosis not present

## 2019-11-18 NOTE — Progress Notes (Signed)
Patient ID: Andrew Perkins, male   DOB: 1950/09/30, 69 y.o.   MRN: [id number]     Patient ID: Andrew Perkins, male   DOB: Jan 27, 1951, 69 y.o.   MRN: [id number]   Date:  11/18/2019   PCP:  Farris Has, MD  Cardiologist:  Dr Eden Emms Primary Electrophysiologist: Hillis Range   One year f/u flecainide  use   History of Present Illness: Andrew Perkins is a 69 y.o. male who presents today for f/u in the afib clinic.   The patient reports initially being diagnosed with atrial fibrillation 2/15 after presenting with tachypalpitations.  In retrospect, he thinks that he has had afib for 4-5 years.  Episodes were initially short, lasting several minutes.  At that time, he attributed them to "anxiety".  He developed sustained tachycardia 2/15 and he presented to Dr Vincente Liberty office with documented afib on ekg.  He was placed on eliquis and metoprolol.  He reports increasing frequency and duration of atrial fibrillation.  Several months ago, he was given flecainide as a "pill in pocket" approach.  He has required flecainide several times since then.  He had an event monitor placed 2/16 which has documented a more regular narrow complex SVT.  He has not tried daily AAD therapy though  He does take metoprolol twice daily.  He has very frequent palpitations which he finds worrisome.  He saw Dr. Johney Frame in consultation 4/16 and was given options to treat afib. He wanted to pursue AAD and avoid ablation.  He was started on flecainide 100 mg bid . He appears to be tolerating without adverse effects and most importantly has very little afib burden on drug. He noticed afib for one hour last Sunday and this is the most afib that he has noticed.  He reports  negative sleep study. He does try to walk daily. Remains on DOAC with chadsvasc score of 2. He states that he does have mild anemia and takes iron. He has had a colonoscopy in the last year and did have polys which were removed.  F/u clinic 07/1515, continue on flecainide and is  doing will with litle afib burden. He report that he had 3 episode in early April, longest epiosde was 3 hours at v rate of 102 bpm, next episode 3 days later at 115 bpm, lasted one hour, last epiosde lasted 5 mins, at 99 bpm. Has not any further episodes since that 8th of April. He is still satisfied with afib management. He can now continue with what he is doing in afib vrs getting lightheaded and having to lay down.  Continues on eliquis, no bleeding issues.  F/u in afib clinic 12/08/15 and reports small afib burden. He is till happy with afib management. He is walking on a regular basis. Afib episodes can last 5 mins to an hour. No bleeding issues with eliquis.  F/u in afib clinc 04/07/16. He reports increase in afib burden over the last week because he traveled out of town and was out of his usual routine with diet and sleeping. He still is happy with flecainide and wants to continue with drug.   Return to afib clinic 7/18. Continues with flecainide and reports very little afib burden. Feels well. Continues with eliquis 5 mg bid, no bleeding issues currently.  F/u 10/25/17,he reports very little afib burden Is happy with current management with flecainide.. No bleeding issues. Had labs drawn with PCP yesterday.  F/u afib clinic 11/15/18. He is still happy with his afib burden on flecainide.  He has 1-2 outbreaks a month, lasting around one hour. He does not have to stop what he is doing when he is in afib. He usually knows only  because of higher heart rates on his watch.   F/u in afib clinic, 11/18/19. He in Sinus brady at 57 bpm, continues on flecainide and BB. He guesstimates around 10 afib episodes over the last year. Most episodes are short, 10-15 mins and his longest episode was around 6 hours. He  took an extra flecainide and BB and he converted. Continues  on eliquis 5 mg bid for a CHA2DS2VASc score of 2.   Today, he denies symptoms of chest pain, shortness of breath, orthopnea, PND, lower  extremity edema, claudication, dizziness, presyncope, syncope, bleeding, or neurologic sequela. The patient is tolerating medications without difficulties and is otherwise without complaint today.    Past Medical History:  Diagnosis Date  . Anemia    a. Mild per pt - previously on iron but did not tolerate.  . Atrial fibrillation (HCC)    a. Dx 03/2013 (but ~3 yr of palps). Spont converted to NSR.  [name] Chronic neck pain 06/27/2017  . Diabetes mellitus without complication (HCC)    borderline  . Erectile dysfunction   . Former consumption of alcohol    a. Abstinent as of 03/2013.  [name] Hx of echocardiogram    Echo (2/16):  Mod focal basal and mild concentric LVH, vigorous LVF, EF 65-70%, no RWMA, Gr 1 DD, trivial AI, severe LAE  . Hypertension   . Left atrial enlargement   . SVT (supraventricular tachycardia) (HCC)   . Vitamin D deficiency    Past Surgical History:  Procedure Laterality Date  . CIRCUMCISION     at 69yrs old  . TONSILLECTOMY    . WISDOM TOOTH EXTRACTION       Current Outpatient Medications  Medication Sig Dispense Refill  . amLODipine (NORVASC) 5 MG tablet Take 5 mg by mouth daily.    [name] apixaban (ELIQUIS) 5 MG TABS tablet Take 1 tablet (5 mg total) by mouth 2 (two) times daily. 180 tablet 2  . Ascorbic Acid (VITAMIN C PO) Take 1 tablet by mouth daily. Not sure the dose    . atorvastatin (LIPITOR) 20 MG tablet TAKE 1 TABLET BY MOUTH  DAILY AT 6 PM. 90 tablet 3  . famotidine (PEPCID) 10 MG tablet Take 10 mg by mouth as needed.     . flecainide (TAMBOCOR) 100 MG tablet Take 1 tablet (100 mg total) by mouth 2 (two) times daily. 180 tablet 2  . losartan (COZAAR) 100 MG tablet Take 100 mg by mouth daily.    . metoprolol tartrate (LOPRESSOR) 25 MG tablet Take 1 tablet (25 mg total) by mouth 2 (two) times daily. 180 tablet 3  . pantoprazole (PROTONIX) 40 MG tablet Take 40 mg by mouth as needed.     . psyllium (HYDROCIL/METAMUCIL) 95 % PACK Take 1 packet by mouth daily.    [name]  VITAMIN D PO Take 1 tablet by mouth daily. Not sure the dose.    . zinc gluconate 50 MG tablet Take 50 mg by mouth daily.    . chlorhexidine (PERIDEX) 0.12 % solution SMARTSIG:By Mouth (Patient not taking: Reported on 11/18/2019)    . diclofenac sodium (VOLTAREN) 1 % GEL Apply 1 application topically daily as needed for pain. (Patient not taking: Reported on 11/18/2019)     No current facility-administered medications for this encounter.    Allergies:  Penicillins   Social History:  The patient  reports that he has never smoked. He has never used smokeless tobacco. He reports that he does not drink alcohol and does not use drugs.   Family History:  The patient's  family history includes Asthma in his brother; Diabetes Mellitus II in his brother.    ROS:  Please see the history of present illness.   All other systems are reviewed and negative.    PHYSICAL EXAM: VS:  BP 128/76   Pulse (!) 57   Ht 6\' 3"  (1.905 m)   Wt 120.8 kg   BMI 33.30 kg/m  , BMI Body mass index is 33.3 kg/m. GEN: overweight in no acute distress  HEENT: normal  Neck: no JVD, carotid bruits, or masses Cardiac: RRR; no murmurs, rubs, or gallops,no edema  Respiratory:  clear to auscultation bilaterally, normal work of breathing GI: soft, nontender, nondistended, + BS MS: no deformity or atrophy  Skin: warm and dry  Neuro:  Strength and sensation are intact Psych: euthymic mood, full affect  EKG:  EKG is ordered today. Sinus brady at 57 bpm, pr int 202 ms, qrs int 112 ms, qtc 430 ms    Wt Readings from Last 3 Encounters:  11/18/19 120.8 kg  07/12/19 118.8 kg  06/05/19 116.8 kg       ASSESSMENT AND PLAN:  1.  Paroxysmal atrial fibrillation/ SVT Low burden Pt does not want to change his approach at this time  Continue flecainide 100 mg bid/metoprolol 25 mg bid. Continue eliquis 5 mg bid  for chads2vasc score of at least 2 No bleeding issues  2. HTN Stable   3. Overweight Body mass index is  33.3 kg/m. Weight loss is advised Continue regular exercise   F/u in afib clinic in one year F/u with Dr. 06/07/19  as per recall    Eden Emms. [facility] [address] [place], [place] [id number]

## 2019-11-18 NOTE — Progress Notes (Signed)
Patient ID: Andrew Perkins, male   DOB: November 14, 1950, 69 y.o.   MRN: [id number]     Patient ID: Andrew Perkins, male   DOB: [birth year], 69 y.o.   MRN: [id number]   Date:  11/25/2019   PCP:  London Pepper, MD  Cardiologist:  Dr Johnsie Cancel Primary Electrophysiologist: Thompson Grayer   No chief complaint on file.    History of Present Illness:  69 y.o. history of PAF. First noted 2015. Been on eliquis, metroprolol and flecainide with  Minimal afib burden Has seen Dr Rayann Heman and preferred not to have ablation. Also has narrow Complex SVT. CHADSVASC score 3. Last normal ETT 2016   Still with occasional break through Episodes don't last long and HR only in 90's so he does Not get dizzy now.   Prefers to stay on flecainide and not consider ablation still No missed doses of DOAC  March 2020 concern for dyspnea TTE done 05/01/18 normal RV/LV function mild MR  Seen in ED 03/11/19 with visual changes compliant with eliquis CTA head / neck with no  Stroke or large vessel carotid disease Told to f/u with eye doctor Left eye worse Seen in f/u by neurology  Ordered ESR which was elevated at 45 and normal C reactive protein ? F/u Seen by ophthalmology and had "floaters" but no glaucoma or retinal issues   Seen by Afib clinic 11/18/19 stable in SR rate 22 Only 10 or so short lived episodes PAF in a year  Asking for eliquis samples Has had COVID vaccine and booster   Past Medical History:  Diagnosis Date  . Anemia    a. Mild per pt - previously on iron but did not tolerate.  . Atrial fibrillation ([place])    a. Dx 03/2013 (but ~3 yr of palps). Spont converted to NSR.  [name] Chronic neck pain 06/27/2017  . Diabetes mellitus without complication ([facility])    borderline  . Erectile dysfunction   . Former consumption of alcohol    a. Abstinent as of 03/2013.  [name] Hx of echocardiogram    Echo (2/16):  Mod focal basal and mild concentric LVH, vigorous LVF, EF 65-70%, no RWMA, Gr 1 DD, trivial AI, severe LAE  . Hypertension     . Left atrial enlargement   . SVT (supraventricular tachycardia) ([place])   . Vitamin D deficiency    Past Surgical History:  Procedure Laterality Date  . CIRCUMCISION     at 69yr old  . TONSILLECTOMY    . WISDOM TOOTH EXTRACTION       Current Outpatient Medications  Medication Sig Dispense Refill  . amLODipine (NORVASC) 5 MG tablet Take 5 mg by mouth daily.    .Marland Kitchenapixaban (ELIQUIS) 5 MG TABS tablet Take 1 tablet (5 mg total) by mouth 2 (two) times daily. 180 tablet 2  . Ascorbic Acid (VITAMIN C PO) Take 1 tablet by mouth daily. Not sure the dose    . atorvastatin (LIPITOR) 20 MG tablet TAKE 1 TABLET BY MOUTH  DAILY AT 6 PM. 90 tablet 3  . chlorhexidine (PERIDEX) 0.12 % solution SMARTSIG:By Mouth    . diclofenac sodium (VOLTAREN) 1 % GEL Apply 1 application topically daily as needed for pain.     . famotidine (PEPCID) 10 MG tablet Take 10 mg by mouth as needed.     . flecainide (TAMBOCOR) 100 MG tablet Take 1 tablet (100 mg total) by mouth 2 (two) times daily. 180 tablet 2  . losartan (COZAAR) 100 MG tablet Take  100 mg by mouth daily.    . metoprolol tartrate (LOPRESSOR) 25 MG tablet Take 1 tablet (25 mg total) by mouth 2 (two) times daily. 180 tablet 3  . pantoprazole (PROTONIX) 40 MG tablet Take 40 mg by mouth as needed.     . psyllium (HYDROCIL/METAMUCIL) 95 % PACK Take 1 packet by mouth daily.    [name] VITAMIN D PO Take 1 tablet by mouth daily. Not sure the dose.    . zinc gluconate 50 MG tablet Take 50 mg by mouth daily.     No current facility-administered medications for this visit.    Allergies:   Penicillins   Social History:  The patient  reports that he has never smoked. He has never used smokeless tobacco. He reports that he does not drink alcohol and does not use drugs.   Family History:  The patient's  family history includes Asthma in his brother; Diabetes Mellitus II in his brother.    ROS:  Please see the history of present illness.   All other systems are reviewed  and negative.    PHYSICAL EXAM: VS:  BP 136/74   Pulse 66   Ht _0  (1.905 m)   Wt 264 lb 3.2 oz (119.8 kg)   SpO2 96%   BMI 33.02 kg/m  , BMI Body mass index is 33.02 kg/m. Affect appropriate Healthy:  appears stated age 2: normal Neck supple with no adenopathy JVP normal no bruits no thyromegaly Lungs clear with no wheezing and good diaphragmatic motion Heart:  S1/S2 no murmur, no rub, gallop or click PMI normal Abdomen: benighn, BS positve, no tenderness, no AAA no bruit.  No HSM or HJR Distal pulses intact with no bruits No edema Neuro non-focal Skin warm and dry No muscular weakness   EKG:  05/01/18 SR rate 57 PR 220 otherwise normal    Recent Labs: 03/11/2019: ALT 15; BUN 17; Creatinine, Ser 1.10; Hemoglobin 13.9; Platelets 219; Potassium 4.1; Sodium 140    Lipid Panel  No results found for: CHOL, TRIG, HDL, CHOLHDL, VLDL, LDLCALC, LDLDIRECT   Wt Readings from Last 3 Encounters:  11/25/19 264 lb 3.2 oz (119.8 kg)  11/18/19 266 lb 6.4 oz (120.8 kg)  07/12/19 262 lb (118.8 kg)      Other studies Reviewed: Additional studies/ records that were reviewed today include echo, labs, prior EKG's and Dr. Jackalyn Lombard mote.    ASSESSMENT AND PLAN:  1.  Paroxysmal atrial fibrillation/ SVT continue beta blocker , flecainide  Continue eliquis for chads2vasc score of at least 3.  Should he fail medical therapy with flecainide then he may be more willing to consider ablation. LA 53 mm on TTE done 05/01/18   2. HTN  Well controlled.  Continue current medications and low sodium Dash type diet.    3. Overweight discussed low carb diet   4. ED:  Ok to use vasodilators but will need primary to prescribe  5. Dyspnea:  Seems functional echo 05/01/18 EF normal just mild MR   6. Vision:  Seen in ER for visual changes CTA negative for stroke / carotid disease Had f/u with neurology and ophthalmology with ? catarcat f/u with neurology for mildly elevated ESR doubt temporal  arteritis       Follow with  me in a year    Signed, Jenkins Rouge, MD  11/25/2019 9:24 AM

## 2019-11-21 ENCOUNTER — Other Ambulatory Visit (HOSPITAL_COMMUNITY): Payer: Self-pay

## 2019-11-21 MED ORDER — FLECAINIDE ACETATE 100 MG PO TABS: 100.0000 mg | ORAL_TABLET | Freq: Two times a day (BID) | ORAL | 2 refills | Status: DC

## 2019-11-25 ENCOUNTER — Encounter: Payer: Self-pay | Admitting: Cardiovascular Disease

## 2019-11-25 ENCOUNTER — Ambulatory Visit (INDEPENDENT_AMBULATORY_CARE_PROVIDER_SITE_OTHER): Payer: PPO | Admitting: Cardiovascular Disease

## 2019-11-25 ENCOUNTER — Other Ambulatory Visit: Payer: Self-pay

## 2019-11-25 VITALS — BP 136/74 | HR 66 | Ht 75.0 in | Wt 264.2 lb

## 2019-11-25 DIAGNOSIS — I480 Paroxysmal atrial fibrillation: Secondary | ICD-10-CM | POA: Diagnosis not present

## 2019-11-25 NOTE — Patient Instructions (Addendum)

## 2019-12-25 DIAGNOSIS — R7309 Other abnormal glucose: Secondary | ICD-10-CM | POA: Diagnosis not present

## 2019-12-25 DIAGNOSIS — E785 Hyperlipidemia, unspecified: Secondary | ICD-10-CM | POA: Diagnosis not present

## 2019-12-25 DIAGNOSIS — I4891 Unspecified atrial fibrillation: Secondary | ICD-10-CM | POA: Diagnosis not present

## 2019-12-25 DIAGNOSIS — I10 Essential (primary) hypertension: Secondary | ICD-10-CM | POA: Diagnosis not present

## 2019-12-25 DIAGNOSIS — R109 Unspecified abdominal pain: Secondary | ICD-10-CM | POA: Diagnosis not present

## 2019-12-25 DIAGNOSIS — Z0000 Encounter for general adult medical examination without abnormal findings: Secondary | ICD-10-CM | POA: Diagnosis not present

## 2020-01-14 DIAGNOSIS — K6289 Other specified diseases of anus and rectum: Secondary | ICD-10-CM | POA: Diagnosis not present

## 2020-01-14 DIAGNOSIS — K5900 Constipation, unspecified: Secondary | ICD-10-CM | POA: Diagnosis not present

## 2020-01-14 DIAGNOSIS — K219 Gastro-esophageal reflux disease without esophagitis: Secondary | ICD-10-CM | POA: Diagnosis not present

## 2020-01-31 DIAGNOSIS — K219 Gastro-esophageal reflux disease without esophagitis: Secondary | ICD-10-CM | POA: Diagnosis not present

## 2020-02-13 ENCOUNTER — Other Ambulatory Visit: Payer: Self-pay | Admitting: Physician Assistant

## 2020-02-13 DIAGNOSIS — K219 Gastro-esophageal reflux disease without esophagitis: Secondary | ICD-10-CM | POA: Diagnosis not present

## 2020-02-13 DIAGNOSIS — Z1211 Encounter for screening for malignant neoplasm of colon: Secondary | ICD-10-CM

## 2020-02-13 DIAGNOSIS — K5900 Constipation, unspecified: Secondary | ICD-10-CM | POA: Diagnosis not present

## 2020-02-24 ENCOUNTER — Other Ambulatory Visit (HOSPITAL_COMMUNITY): Payer: Self-pay | Admitting: *Deleted

## 2020-02-24 MED ORDER — APIXABAN 5 MG PO TABS: 5.0000 mg | ORAL_TABLET | Freq: Two times a day (BID) | ORAL | 2 refills | Status: DC

## 2020-03-05 ENCOUNTER — Ambulatory Visit
Admission: RE | Admit: 2020-03-05 | Discharge: 2020-03-05 | Disposition: A | Discharge status: Home / Self Care | Payer: PPO | Source: Ambulatory Visit | Attending: Physician Assistant | Admitting: Physician Assistant

## 2020-03-05 DIAGNOSIS — Z1211 Encounter for screening for malignant neoplasm of colon: Secondary | ICD-10-CM | POA: Diagnosis not present

## 2020-03-05 DIAGNOSIS — K449 Diaphragmatic hernia without obstruction or gangrene: Secondary | ICD-10-CM | POA: Diagnosis not present

## 2020-03-05 DIAGNOSIS — K635 Polyp of colon: Secondary | ICD-10-CM | POA: Diagnosis not present

## 2020-03-05 DIAGNOSIS — K8020 Calculus of gallbladder without cholecystitis without obstruction: Secondary | ICD-10-CM | POA: Diagnosis not present

## 2020-03-27 DIAGNOSIS — Z86010 Personal history of colonic polyps: Secondary | ICD-10-CM | POA: Diagnosis not present

## 2020-03-27 DIAGNOSIS — Z8719 Personal history of other diseases of the digestive system: Secondary | ICD-10-CM | POA: Diagnosis not present

## 2020-03-27 DIAGNOSIS — Z7901 Long term (current) use of anticoagulants: Secondary | ICD-10-CM | POA: Diagnosis not present

## 2020-04-06 ENCOUNTER — Other Ambulatory Visit (HOSPITAL_COMMUNITY): Payer: Self-pay

## 2020-04-06 MED ORDER — METOPROLOL TARTRATE 25 MG PO TABS: 25.0000 mg | ORAL_TABLET | Freq: Two times a day (BID) | ORAL | 3 refills | Status: DC

## 2020-04-08 DIAGNOSIS — H43813 Vitreous degeneration, bilateral: Secondary | ICD-10-CM | POA: Diagnosis not present

## 2020-04-08 DIAGNOSIS — H2512 Age-related nuclear cataract, left eye: Secondary | ICD-10-CM | POA: Diagnosis not present

## 2020-06-18 DIAGNOSIS — I10 Essential (primary) hypertension: Secondary | ICD-10-CM | POA: Diagnosis not present

## 2020-06-18 DIAGNOSIS — R7309 Other abnormal glucose: Secondary | ICD-10-CM | POA: Diagnosis not present

## 2020-06-18 DIAGNOSIS — R202 Paresthesia of skin: Secondary | ICD-10-CM | POA: Diagnosis not present

## 2020-06-18 DIAGNOSIS — I4891 Unspecified atrial fibrillation: Secondary | ICD-10-CM | POA: Diagnosis not present

## 2020-06-18 DIAGNOSIS — D649 Anemia, unspecified: Secondary | ICD-10-CM | POA: Diagnosis not present

## 2020-06-22 DIAGNOSIS — R202 Paresthesia of skin: Secondary | ICD-10-CM | POA: Diagnosis not present

## 2020-07-17 DIAGNOSIS — E876 Hypokalemia: Secondary | ICD-10-CM | POA: Diagnosis not present

## 2020-08-08 DIAGNOSIS — I10 Essential (primary) hypertension: Secondary | ICD-10-CM | POA: Diagnosis not present

## 2020-08-08 DIAGNOSIS — Z79899 Other long term (current) drug therapy: Secondary | ICD-10-CM | POA: Diagnosis not present

## 2020-08-08 DIAGNOSIS — I480 Paroxysmal atrial fibrillation: Secondary | ICD-10-CM | POA: Diagnosis not present

## 2020-08-08 DIAGNOSIS — Z7901 Long term (current) use of anticoagulants: Secondary | ICD-10-CM | POA: Diagnosis not present

## 2020-08-08 DIAGNOSIS — U071 COVID-19: Secondary | ICD-10-CM | POA: Diagnosis not present

## 2020-08-17 ENCOUNTER — Other Ambulatory Visit (HOSPITAL_COMMUNITY): Payer: Self-pay

## 2020-08-17 MED ORDER — FLECAINIDE ACETATE 100 MG PO TABS: 100.0000 mg | ORAL_TABLET | Freq: Two times a day (BID) | ORAL | 2 refills | Status: DC

## 2020-08-19 DIAGNOSIS — R200 Anesthesia of skin: Secondary | ICD-10-CM | POA: Diagnosis not present

## 2020-08-26 ENCOUNTER — Other Ambulatory Visit (HOSPITAL_COMMUNITY): Payer: Self-pay

## 2020-09-01 ENCOUNTER — Encounter (HOSPITAL_COMMUNITY): Payer: Self-pay | Admitting: Nurse Practitioner

## 2020-09-01 ENCOUNTER — Other Ambulatory Visit: Payer: Self-pay

## 2020-09-01 ENCOUNTER — Ambulatory Visit (HOSPITAL_COMMUNITY)
Admission: RE | Admit: 2020-09-01 | Discharge: 2020-09-01 | Disposition: A | Discharge status: Home / Self Care | Payer: PPO | Source: Ambulatory Visit | Attending: Nurse Practitioner | Admitting: Nurse Practitioner

## 2020-09-01 VITALS — BP 154/76 | HR 65 | Ht 75.0 in | Wt 259.0 lb

## 2020-09-01 DIAGNOSIS — Z79899 Other long term (current) drug therapy: Secondary | ICD-10-CM | POA: Insufficient documentation

## 2020-09-01 DIAGNOSIS — Z8616 Personal history of COVID-19: Secondary | ICD-10-CM | POA: Insufficient documentation

## 2020-09-01 DIAGNOSIS — I471 Supraventricular tachycardia: Secondary | ICD-10-CM | POA: Diagnosis not present

## 2020-09-01 DIAGNOSIS — Z6832 Body mass index (BMI) 32.0-32.9, adult: Secondary | ICD-10-CM | POA: Insufficient documentation

## 2020-09-01 DIAGNOSIS — E663 Overweight: Secondary | ICD-10-CM | POA: Diagnosis not present

## 2020-09-01 DIAGNOSIS — I480 Paroxysmal atrial fibrillation: Secondary | ICD-10-CM | POA: Diagnosis not present

## 2020-09-01 DIAGNOSIS — Z7901 Long term (current) use of anticoagulants: Secondary | ICD-10-CM | POA: Diagnosis not present

## 2020-09-01 DIAGNOSIS — D649 Anemia, unspecified: Secondary | ICD-10-CM | POA: Insufficient documentation

## 2020-09-01 DIAGNOSIS — D6869 Other thrombophilia: Secondary | ICD-10-CM | POA: Diagnosis not present

## 2020-09-01 DIAGNOSIS — I10 Essential (primary) hypertension: Secondary | ICD-10-CM | POA: Diagnosis not present

## 2020-09-01 NOTE — Progress Notes (Signed)
Patient ID: Andrew Perkins, male   DOB: [birth year], 70 y.o.   MRN: [id number]     Patient ID: Andrew Perkins, male   DOB: [birth year], 70 y.o.   MRN: [id number]   Date:  09/01/2020   PCP:  Farris Has, MD  Cardiologist:  Dr Eden Emms Primary Electrophysiologist: Hillis Range   One year f/u flecainide  use   History of Present Illness: Andrew Perkins is a 70 y.o. male who presents today for f/u in the afib clinic.   The patient reports initially being diagnosed with atrial fibrillation 2/15 after presenting with tachypalpitations.  In retrospect, he thinks that he has had afib for 4-5 years.  Episodes were initially short, lasting several minutes.  At that time, he attributed them to "anxiety".  He developed sustained tachycardia 2/15 and he presented to Dr Vincente Liberty office with documented afib on ekg.  He was placed on eliquis and metoprolol.  He reports increasing frequency and duration of atrial fibrillation.  Several months ago, he was given flecainide as a "pill in pocket" approach.  He has required flecainide several times since then.  He had an event monitor placed 2/16 which has documented a more regular narrow complex SVT.  He has not tried daily AAD therapy though  He does take metoprolol twice daily.  He has very frequent palpitations which he finds worrisome.  He saw Dr. Johney Frame in consultation 4/16 and was given options to treat afib. He wanted to pursue AAD and avoid ablation.  He was started on flecainide 100 mg bid . He appears to be tolerating without adverse effects and most importantly has very little afib burden on drug. He noticed afib for one hour last Sunday and this is the most afib that he has noticed.  He reports  negative sleep study. He does try to walk daily. Remains on DOAC with chadsvasc score of 2. He states that he does have mild anemia and takes iron. He has had a colonoscopy in the last year and did have polys which were removed.  F/u clinic 07/1515, continue on flecainide and is  doing will with litle afib burden. He report that he had 3 episode in early April, longest epiosde was 3 hours at v rate of 102 bpm, next episode 3 days later at 115 bpm, lasted one hour, last epiosde lasted 5 mins, at 99 bpm. Has not any further episodes since that 8th of April. He is still satisfied with afib management. He can now continue with what he is doing in afib vrs getting lightheaded and having to lay down.  Continues on eliquis, no bleeding issues.  F/u in afib clinic 12/08/15 and reports small afib burden. He is till happy with afib management. He is walking on a regular basis. Afib episodes can last 5 mins to an hour. No bleeding issues with eliquis.  F/u in afib clinc 04/07/16. He reports increase in afib burden over the last week because he traveled out of town and was out of his usual routine with diet and sleeping. He still is happy with flecainide and wants to continue with drug.   Return to afib clinic 7/18. Continues with flecainide and reports very little afib burden. Feels well. Continues with eliquis 5 mg bid, no bleeding issues currently.  F/u 10/25/17,he reports very little afib burden Is happy with current management with flecainide.. No bleeding issues. Had labs drawn with PCP yesterday.  F/u afib clinic 11/15/18. He is still happy with his afib burden on flecainide.  He has 1-2 outbreaks a month, lasting around one hour. He does not have to stop what he is doing when he is in afib. He usually knows only  because of higher heart rates on his watch.   F/u in afib clinic, 11/18/19. He in Sinus brady at 57 bpm, continues on flecainide and BB. He guesstimates around 10 afib episodes over the last year. Most episodes are short, 10-15 mins and his longest episode was around 6 hours. He  took an extra flecainide and BB and he converted. Continues  on eliquis 5 mg bid for a CHA2DS2VASc score of 2.   F/u in afib clinic, 09/01/20. He reports that he was visiting his daughter in [place]  middle of June, he had chills with elevated heart rate. He went to the ER there and tested COVID positive. He was in sinus tachycardia, EKG results reviewed in Epic. [name] He had elevated HR and chills for another 24 hours and then his symptoms became minimal. He has noted some short elevations of heart rate since then. Apple strips reviewed and appear to show sinus tach. He will occassionally will take a prn metoprolol.   Today, he denies symptoms of chest pain, shortness of breath, orthopnea, PND, lower extremity edema, claudication, dizziness, presyncope, syncope, bleeding, or neurologic sequela. The patient is tolerating medications without difficulties and is otherwise without complaint today.    Past Medical History:  Diagnosis Date   Anemia    a. Mild per pt - previously on iron but did not tolerate.   Atrial fibrillation ([facility])    a. Dx 03/2013 (but ~3 yr of palps). Spont converted to NSR.   Chronic neck pain 06/27/2017   Diabetes mellitus without complication ([facility])    borderline   Erectile dysfunction    Former consumption of alcohol    a. Abstinent as of 03/2013.   Hx of echocardiogram    Echo (2/16):  Mod focal basal and mild concentric LVH, vigorous LVF, EF 65-70%, no RWMA, Gr 1 DD, trivial AI, severe LAE   Hypertension    Left atrial enlargement    SVT (supraventricular tachycardia) ([facility])    Vitamin D deficiency    Past Surgical History:  Procedure Laterality Date   CIRCUMCISION     at 70yrs old   TONSILLECTOMY     WISDOM TOOTH EXTRACTION       Current Outpatient Medications  Medication Sig Dispense Refill   amLODipine (NORVASC) 5 MG tablet Take 5 mg by mouth daily.     apixaban (ELIQUIS) 5 MG TABS tablet Take 1 tablet (5 mg total) by mouth 2 (two) times daily. 180 tablet 2   Ascorbic Acid (VITAMIN C PO) Take 1 tablet by mouth daily. Not sure the dose     atorvastatin (LIPITOR) 20 MG tablet TAKE 1 TABLET BY MOUTH  DAILY AT 6 PM. 90 tablet 3   diclofenac sodium (VOLTAREN) 1  % GEL Apply 1 application topically daily as needed for pain.      famotidine (PEPCID) 10 MG tablet Take 10 mg by mouth as needed.      flecainide (TAMBOCOR) 100 MG tablet Take 1 tablet (100 mg total) by mouth 2 (two) times daily. 180 tablet 2   losartan (COZAAR) 100 MG tablet Take 100 mg by mouth daily.     meloxicam (MOBIC) 7.5 MG tablet Take 7.5 mg by mouth daily.     metoprolol tartrate (LOPRESSOR) 25 MG tablet Take 1 tablet (25 mg total) by mouth 2 (  two) times daily. 180 tablet 3   pantoprazole (PROTONIX) 40 MG tablet Take 40 mg by mouth as needed.      psyllium (HYDROCIL/METAMUCIL) 95 % PACK Take 1 packet by mouth daily.     zinc gluconate 50 MG tablet Take 50 mg by mouth daily.     No current facility-administered medications for this encounter.    Allergies:   Penicillins   Social History:  The patient  reports that he has never smoked. He has never used smokeless tobacco. He reports that he does not drink alcohol and does not use drugs.   Family History:  The patient's  family history includes Asthma in his brother; Diabetes Mellitus II in his brother.    ROS:  Please see the history of present illness.   All other systems are reviewed and negative.    PHYSICAL EXAM: VS:  BP (!) 154/76   Pulse 65   Ht 6\' 3"  (1.905 m)   Wt 117.5 kg   BMI 32.37 kg/m  , BMI Body mass index is 32.37 kg/m. GEN: overweight in no acute distress  HEENT: normal  Neck: no JVD, carotid bruits, or masses Cardiac: RRR; no murmurs, rubs, or gallops,no edema  Respiratory:  clear to auscultation bilaterally, normal work of breathing GI: soft, nontender, nondistended, + BS MS: no deformity or atrophy  Skin: warm and dry  Neuro:  Strength and sensation are intact Psych: euthymic mood, full affect  EKG:  EKG is ordered today. Sinus rhythm  at 65 bpm, pr int 230 ms, qrs int 110 ms, qtc 451 ms    Wt Readings from Last 3 Encounters:  09/01/20 117.5 kg  11/25/19 119.8 kg  11/18/19 120.8 kg        ASSESSMENT AND PLAN:  1.  Paroxysmal atrial fibrillation/ SVT Low burden SVT seen when strips reviewed, the majority of it occurred when sick with covid 2/2 fever  Pt does not want to change his approach at this time  Continue flecainide 100 mg bid/metoprolol 25 mg bid. Continue eliquis 5 mg bid  for chads2vasc score of at least 2  2. HTN Elevated in office today  Avoid salt  Stable otherwise   3. Overweight Body mass index is 32.37 kg/m. Weight loss is advised Continue regular exercise   F/u in afib clinic in one year F/u with Dr. 11/20/19  as per recall    Eden Emms. [facility] [address] [place], [place] [phone]

## 2020-10-05 ENCOUNTER — Other Ambulatory Visit (HOSPITAL_COMMUNITY): Payer: Self-pay

## 2020-10-05 MED ORDER — APIXABAN 5 MG PO TABS: 5.0000 mg | ORAL_TABLET | Freq: Two times a day (BID) | ORAL | 0 refills | Status: DC

## 2020-10-30 ENCOUNTER — Other Ambulatory Visit (HOSPITAL_COMMUNITY): Payer: Self-pay

## 2020-10-30 MED ORDER — APIXABAN 5 MG PO TABS: 5.0000 mg | ORAL_TABLET | Freq: Two times a day (BID) | ORAL | 3 refills | Status: DC

## 2020-11-11 DIAGNOSIS — G5601 Carpal tunnel syndrome, right upper limb: Secondary | ICD-10-CM | POA: Diagnosis not present

## 2020-11-11 IMAGING — RF DG UGI W/ HIGH DENSITY W/O KUB
10 series · 14 of 24 positions shown · non-contrast
Comparison: CT Abdomen and Pelvis 10/14/2016.

CLINICAL DATA: 69-year-old male with heartburn symptoms, epigastric
pain for 4-5 months. Reflux.

EXAM:
UPPER GI SERIES WITH KUB
TECHNIQUE: After obtaining a scout radiograph a routine upper GI series was
performed using effervescent crystals and barium.
FLUOROSCOPY TIME:  Fluoroscopy Time:  2 minutes 18 seconds
Radiation Exposure Index (if provided by the fluoroscopic device):
Number of Acquired Spot Images: 0

[Series 1: one shot · 0.14mm/px · 1 of 1 slices shown (1 of 5)]
[im 1/1]
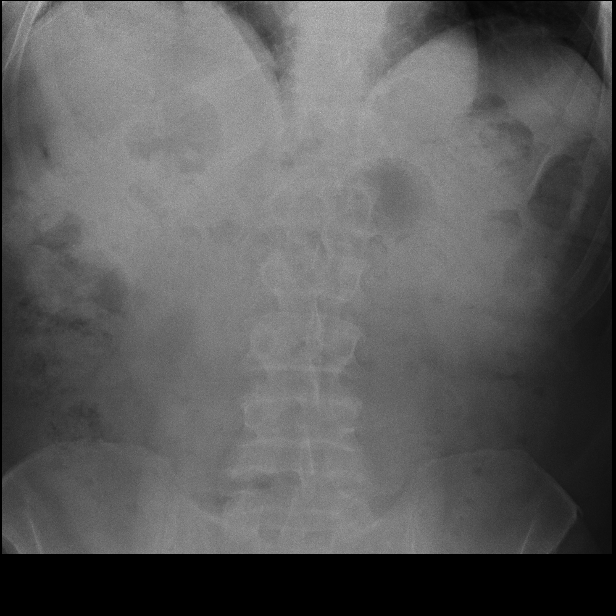

[Series 2: sequence · 1 of 23 frames shown (1 of 5)]
[frame 20/23]
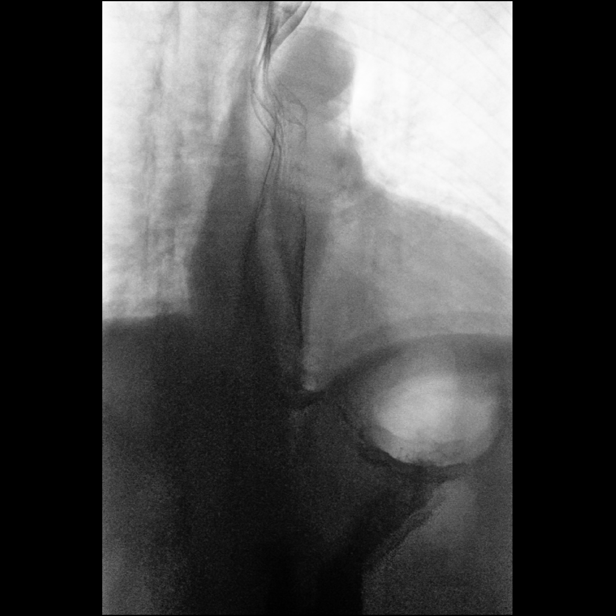

[Series 3: sequence · 1 of 7 frames shown (2 of 5)]
[frame 4/7]
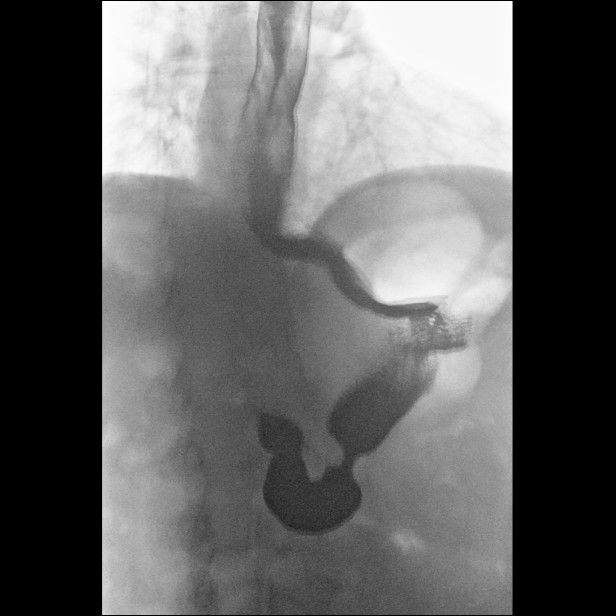

[Series 4: one shot · 1 of 1 slices shown (2 of 5)]
[im 1/1]
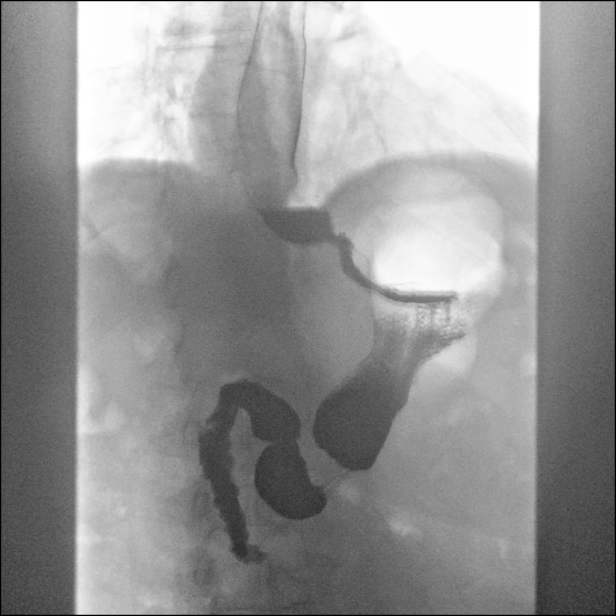

[Series 5: sequence · 1 of 18 frames shown (3 of 5)]
[frame 10/18]
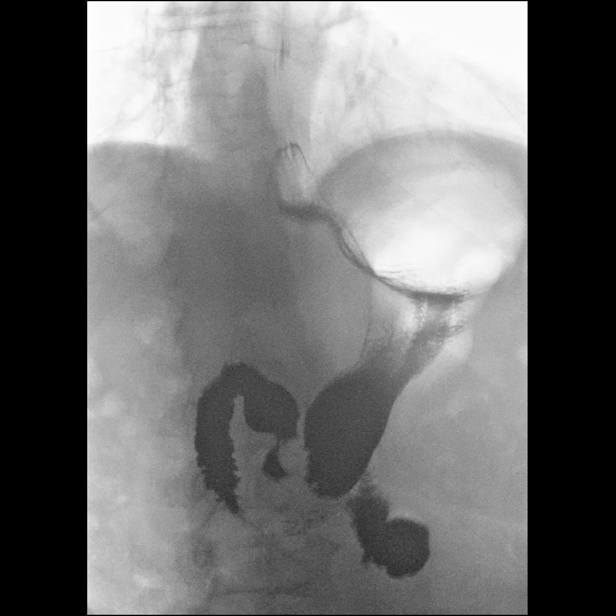

[Series 6: one shot · 1 of 3 slices shown (3 of 5)]
[im 2/3]
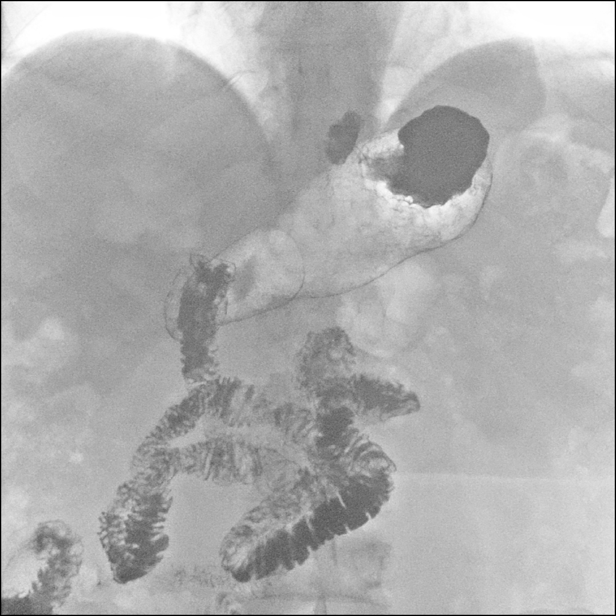

[Series 7: sequence · 2 of 20 frames shown (4 of 5)]
[frame 9/20]
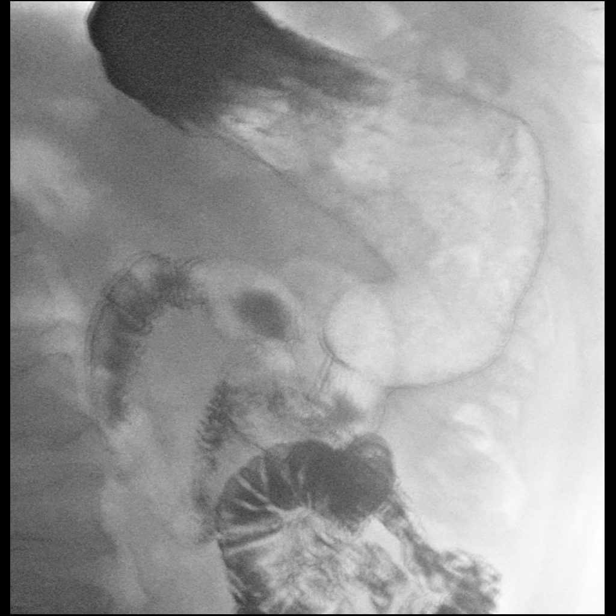
[frame 11/20]
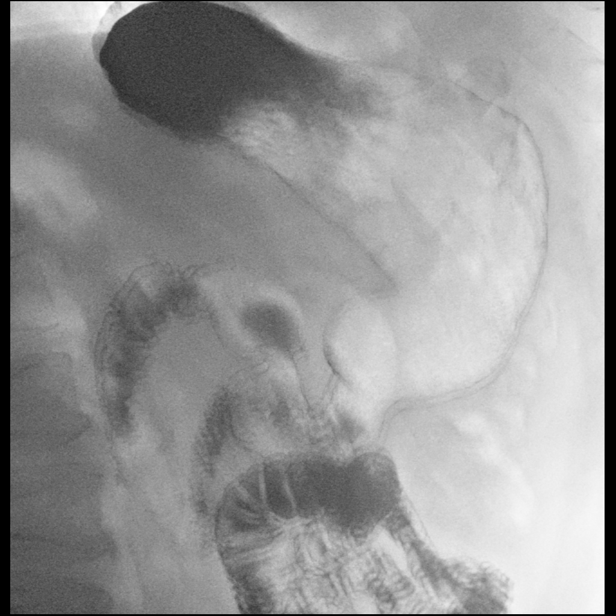

[Series 8: one shot · 3 of 8 slices shown (4 of 5)]
[im 2/8]
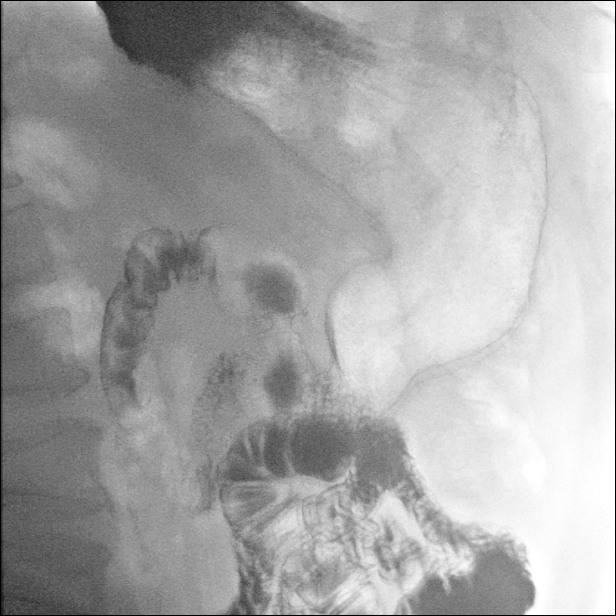
[im 5/8]
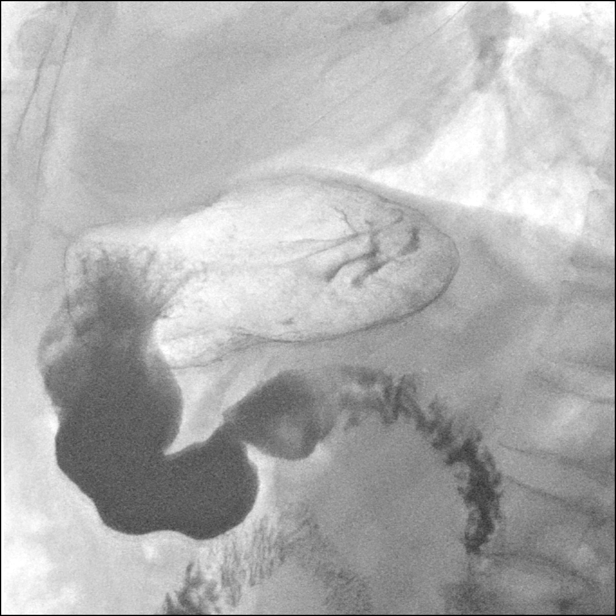
[im 8/8]
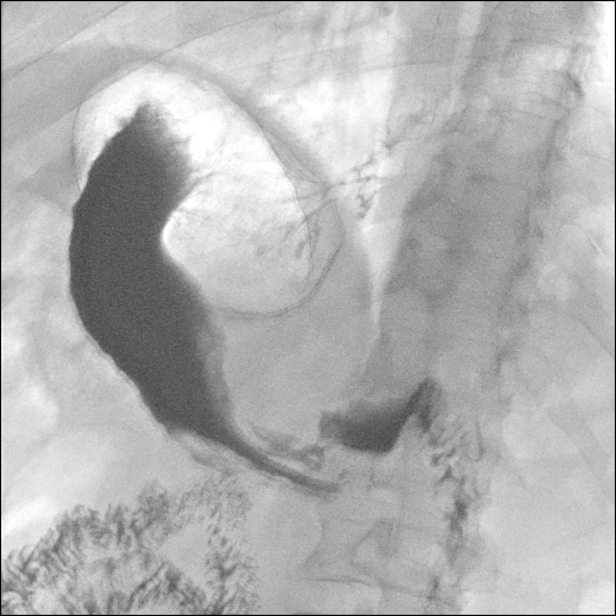

[Series 9: sequence · 1 of 42 frames shown (5 of 5)]
[frame 22/42]
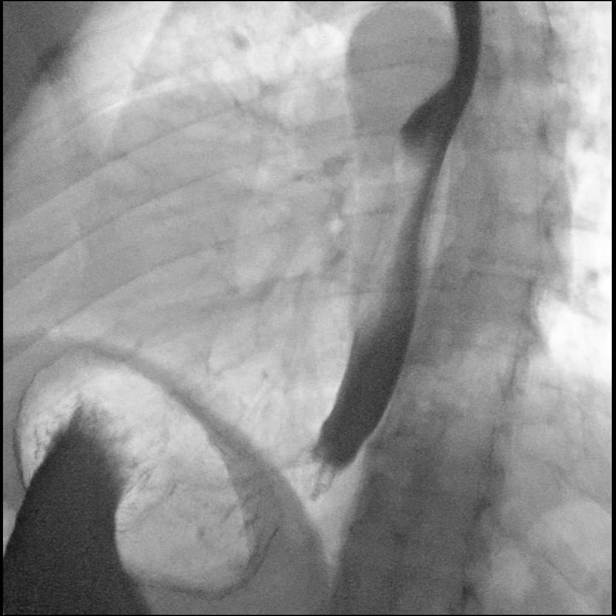

[Series 10: one shot · 2 of 4 slices shown (5 of 5)]
[im 1/4]
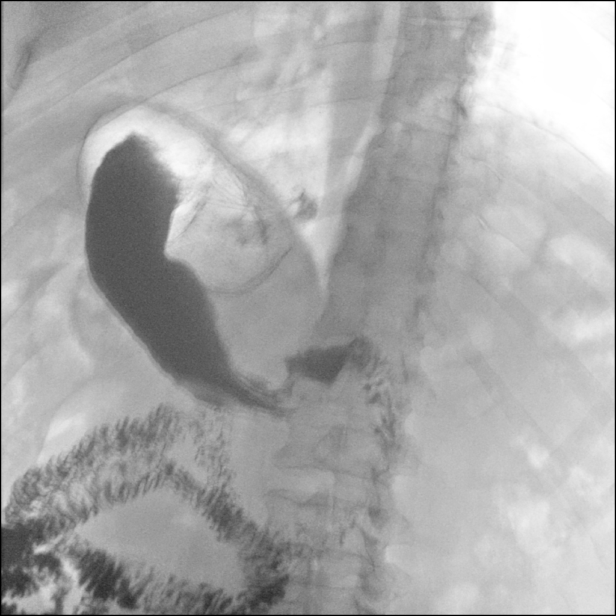
[im 4/4]
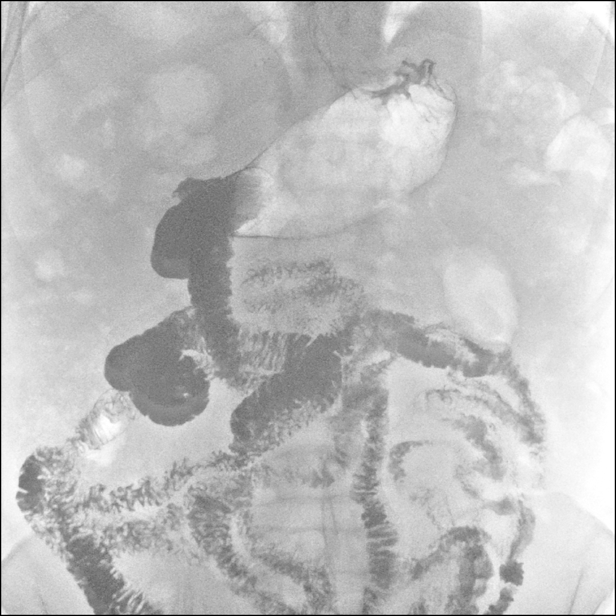

[14 of 24 positions shown; findings below may reference images not displayed]

FINDINGS: Preprocedural scalp view of the abdomen. Non obstructed bowel gas
pattern. Normal abdominal and pelvic visceral contours. Multilevel
mild lumbar spine compression deformities, which may have progressed
since 8888.

A double contrast study was undertaken and the patient tolerated
this well and without difficulty.

No obstruction to the forward flow of contrast throughout the
esophagus and into the stomach. Normal esophageal course and
contour.

Normal gastroesophageal junction. Prompt gastric emptying and
contrast transit into the jejunum. Normal duodenum C-loop
configuration (series 4 and 5).

Gastric contour and mucosal pattern are within normal limits.
Gastric antrum, duodenal bulb and duodenal mucosal pattern are
within normal limits.

With prone swallows esophageal motility is normal for age.

No gastroesophageal reflux occurred spontaneously or was elicited.
Negative visible opacified small bowel loops.
IMPRESSION: Normal upper GI series.

## 2020-11-17 ENCOUNTER — Encounter: Payer: Self-pay | Admitting: Physician Assistant

## 2020-11-17 NOTE — Progress Notes (Signed)
Cardiology Office Note    Date:  11/19/2020   ID:  Andrew Perkins, DOB 11/09/1950, MRN [id number]  PCP:  Farris Has, MD  Cardiologist:  Charlton Haws, MD Electrophysiologist:  None , Afib clinic  Chief Complaint: f/u atrial fibrillation  History of Present Illness:   Andrew Perkins is a 70 y.o. male with history of PAF, anemia, DM, mild AI, former ETOH, HTN, HLD, SVT, first degree AVB, vitamin D deficiency, ED who presents for follow-up. He has had prior cardiac testing to include normal nuc in 2015 and normal ETT in 2016. Last echo 04/2018 showed EF 55-60%, impaired relaxation, moderately dilated LA, mild AI. He has been managed on flecainide - sees both Dr. Eden Emms and afib clinic. He was seen at an outside hospital in 07/2020 for chills and palpitations and tested positive for Covid. Per afib clinic notes, he was in sinus tach at that time. Labs did reveal initial hyperkalemia felt to be hemolyzed with normal follow-up value. Per Afib visit 08/2020 he was overall felt to be doing well. He had reported some palpitations with Kardia strips showing sinus tach only and current regimen was continued.  He is seen back for follow-up overall doing well without any new cardiac symptoms. He continues to have rare breakthrough palpitations (last episode 1 month ago) with Kardia tracings showing low level sinus tach only (I.e.102bpm). He walks several days a week and denies any new exertional chest pain or dyspnea. No syncope. Tolerating all meds without adverse effect.   Labwork independently reviewed: 07/2020 K 3.8, troponin neg, K 5.6 (originally 9.2 suspected hemolyzed), Cr 1.2, BNP 309, Hgb 13.1 2015 TSH wnl  Past Medical History:  Diagnosis Date   Anemia    a. Mild per pt - previously on iron but did not tolerate.   Chronic neck pain 06/27/2017   Diabetes mellitus without complication (HCC)    borderline   Erectile dysfunction    Former consumption of alcohol    a. Abstinent as of 03/2013.    Hypertension    Left atrial enlargement    Mild aortic insufficiency    PAF (paroxysmal atrial fibrillation) (HCC)    SVT (supraventricular tachycardia) (HCC)    Vitamin D deficiency     Past Surgical History:  Procedure Laterality Date   CIRCUMCISION     at 70yrs old   TONSILLECTOMY     WISDOM TOOTH EXTRACTION      Current Medications: Current Meds  Medication Sig   amLODipine (NORVASC) 5 MG tablet Take 5 mg by mouth daily.   apixaban (ELIQUIS) 5 MG TABS tablet Take 1 tablet (5 mg total) by mouth 2 (two) times daily.   Ascorbic Acid (VITAMIN C PO) Take 1 tablet by mouth daily. Not sure the dose   atorvastatin (LIPITOR) 20 MG tablet TAKE 1 TABLET BY MOUTH  DAILY AT 6 PM.   diclofenac sodium (VOLTAREN) 1 % GEL Apply 1 application topically daily as needed for pain.    famotidine (PEPCID) 10 MG tablet Take 10 mg by mouth as needed.    flecainide (TAMBOCOR) 100 MG tablet Take 1 tablet (100 mg total) by mouth 2 (two) times daily.   losartan (COZAAR) 100 MG tablet Take 100 mg by mouth daily.   metoprolol tartrate (LOPRESSOR) 25 MG tablet Take 1 tablet (25 mg total) by mouth 2 (two) times daily.   pantoprazole (PROTONIX) 40 MG tablet Take 40 mg by mouth as needed.    psyllium (HYDROCIL/METAMUCIL) 95 % PACK Take 1  packet by mouth daily.   zinc gluconate 50 MG tablet Take 50 mg by mouth daily.    Allergies:   Penicillins   Social History   Socioeconomic History   Marital status: Single    Spouse name: Not on file   Number of children: Not on file   Years of education: 12   Highest education level: Not on file  Occupational History   Occupation: Retired  Tobacco Use   Smoking status: Never   Smokeless tobacco: Never  Vaping Use   Vaping Use: Never used  Substance and Sexual Activity   Alcohol use: No    Alcohol/week: 0.0 standard drinks   Drug use: No   Sexual activity: Not on file  Other Topics Concern   Not on file  Social History Narrative   Pt lives in [place]  alone.  Retired.   Caffeine use: none   Right handed    Social Determinants of Health   Financial Resource Strain: Not on file  Food Insecurity: Not on file  Transportation Needs: Not on file  Physical Activity: Not on file  Stress: Not on file  Social Connections: Not on file     Family History:  The patient's family history includes Asthma in his brother; Diabetes Mellitus II in his brother. There is no history of CAD, Stroke, or Heart attack.  ROS:   Please see the history of present illness.  All other systems are reviewed and otherwise negative.    EKGs/Labs/Other Studies Reviewed:    Studies reviewed are outlined and summarized above. Reports included below if pertinent.  2D echo 04/2018  1. The left ventricle has normal systolic function, with an ejection  fraction of 55-60%. The cavity size was normal. Left ventricular diastolic  Doppler parameters are consistent with impaired relaxation.   2. The right ventricle has normal systolic function. The cavity was  normal. There is no increase in right ventricular wall thickness.   3. Left atrial size was moderately dilated.   4. Aortic valve regurgitation is mild by color flow Doppler.   NST 2015 Overall Impression:  Low risk stress nuclear study with fixed inferior bowel artifact. No ischemia. Excellent exercise effort. DTS of +5.   LV Ejection Fraction: 58%.  LV Wall Motion:  Normal Wall Motion  ETT 2016 There was no ST segment deviation noted during stress.   Clinically and electrically negative for ischemia  Excellent exercise capacity        EKG:  EKG is not ordered today but reviewed from 08/2020 NSR 65bpm first degree AVB, QTc  Recent Labs: No results found for requested labs within last 8760 hours.  Recent Lipid Panel No results found for: CHOL, TRIG, HDL, CHOLHDL, VLDL, LDLCALC, LDLDIRECT  PHYSICAL EXAM:    VS:  BP 130/80   Pulse 64   Ht 6\' 3"  (1.905 m)   Wt 261 lb (118.4 kg)   SpO2 96%    BMI 32.62 kg/m   BMI: Body mass index is 32.62 kg/m.  GEN: Well nourished, well developed male in no acute distress HEENT: normocephalic, atraumatic Neck: no JVD, carotid bruits, or masses Cardiac: RRR; no murmurs, rubs, or gallops, no edema  Respiratory:  clear to auscultation bilaterally, normal work of breathing GI: soft, nontender, nondistended, + BS MS: no deformity or atrophy Skin: warm and dry, no rash Neuro:  Alert and Oriented x 3, Strength and sensation are intact, follows commands Psych: euthymic mood, full affect  Wt Readings from Last  3 Encounters:  11/19/20 261 lb (118.4 kg)  09/01/20 259 lb (117.5 kg)  11/25/19 264 lb 3.2 oz (119.8 kg)     ASSESSMENT & PLAN:   1. Paroxysmal atrial fibrillation, also h/o PSVT - generally quiescent on current regimen to include metoprolol and flecainide. Has had episodic breakthrough palpitations but Kardia strips only showing low grade sinus tach. (This was also the case when seen by afib clinic as well.) He otherwise seems to be doing well. We'll update his electrolytes and thyroid today to ensure stability, as well as blood count on Eliquis. CHADSVASC 3. He is not describing any new ischemic symptoms and had normal post-flecainide-initiation GXT 2016. He will also continue usual follow-up with afib clinic who manages his flecainide.   2. Essential HTN - upper limits of normal today but otherwise controlled by recent ranges. Will continue present regimen and check labs.  3. Mild aortic insufficiency - seen on echo 04/2018. Guidelines suggest consideration of repeating in 3-5 years. As he is doing well and clinically asymptomatic, will plan to have him f/u in 1 year with Dr. Eden Emms at which time timing of f/u echo can be determined.  4. Hyperlipidemia - atorvastatin has been historically prescribed by Dr. Eden Emms, so will update labs today. He did have a breakfast sandwich earlier this AM. Will get CMET, lipid panel and direct LDL today. Per  CT 2018 there was peripheral calcified atherosclerotic plaque so would aim for goal LDL of <70.  Disposition: F/u with Dr. Eden Emms in 1 year. Also keep ongoing f/u with afib clinic as directed.   Medication Adjustments/Labs and Tests Ordered: Current medicines are reviewed at length with the patient today.  Concerns regarding medicines are outlined above. Medication changes, Labs and Tests ordered today are summarized above and listed in the Patient Instructions accessible in Encounters.   Signed, Laurann Montana, PA-C  11/19/2020 10:09 AM    [facility] [address] [place], [place], [place]  [address] Phone: [phone]; Fax: [phone]

## 2020-11-19 ENCOUNTER — Encounter: Payer: Self-pay | Admitting: Physician Assistant

## 2020-11-19 ENCOUNTER — Other Ambulatory Visit: Payer: Self-pay

## 2020-11-19 ENCOUNTER — Ambulatory Visit: Payer: PPO | Admitting: Physician Assistant

## 2020-11-19 VITALS — BP 130/80 | HR 64 | Ht 75.0 in | Wt 261.0 lb

## 2020-11-19 DIAGNOSIS — I10 Essential (primary) hypertension: Secondary | ICD-10-CM

## 2020-11-19 DIAGNOSIS — I480 Paroxysmal atrial fibrillation: Secondary | ICD-10-CM

## 2020-11-19 DIAGNOSIS — I471 Supraventricular tachycardia: Secondary | ICD-10-CM | POA: Diagnosis not present

## 2020-11-19 DIAGNOSIS — I351 Nonrheumatic aortic (valve) insufficiency: Secondary | ICD-10-CM

## 2020-11-19 DIAGNOSIS — E785 Hyperlipidemia, unspecified: Secondary | ICD-10-CM

## 2020-11-19 LAB — CBC
Hematocrit: 38.8 % (ref 37.5–51.0)
Hemoglobin: 13 g/dL (ref 13.0–17.7)
MCH: 28.8 pg (ref 26.6–33.0)
MCHC: 33.5 g/dL (ref 31.5–35.7)
MCV: 86 fL (ref 79–97)
Platelets: 239 10*3/uL (ref 150–450)
RBC: 4.52 x10E6/uL (ref 4.14–5.80)
RDW: 14.6 % (ref 11.6–15.4)
WBC: 6.9 10*3/uL (ref 3.4–10.8)

## 2020-11-19 LAB — LIPID PANEL
Chol/HDL Ratio: 4.4 ratio (ref 0.0–5.0)
Cholesterol, Total: 144 mg/dL (ref 100–199)
HDL: 33 mg/dL — ABNORMAL LOW (ref >39)
LDL Chol Calc (NIH): 88 mg/dL (ref 0–99)
Triglycerides: 130 mg/dL (ref 0–149)
VLDL Cholesterol Cal: 23 mg/dL (ref 5–40)

## 2020-11-19 LAB — COMPREHENSIVE METABOLIC PANEL
ALT: 10 IU/L (ref 0–44)
AST: 13 IU/L (ref 0–40)
Albumin/Globulin Ratio: 1.6 (ref 1.2–2.2)
Albumin: 4.3 g/dL (ref 3.7–4.7)
Alkaline Phosphatase: 81 IU/L (ref 44–121)
BUN/Creatinine Ratio: 14 (ref 10–24)
BUN: 15 mg/dL (ref 8–27)
Bilirubin Total: 0.5 mg/dL (ref 0.0–1.2)
CO2: 21 mmol/L (ref 20–29)
Calcium: 9.2 mg/dL (ref 8.6–10.2)
Chloride: 108 mmol/L — ABNORMAL HIGH (ref 96–106)
Creatinine, Ser: 1.07 mg/dL (ref 0.76–1.27)
Globulin, Total: 2.7 g/dL (ref 1.5–4.5)
Glucose: 113 mg/dL — ABNORMAL HIGH (ref 70–99)
Potassium: 3.4 mmol/L — ABNORMAL LOW (ref 3.5–5.2)
Sodium: 143 mmol/L (ref 134–144)
Total Protein: 7 g/dL (ref 6.0–8.5)
eGFR: 74 mL/min/{1.73_m2} (ref >59)

## 2020-11-19 LAB — LDL CHOLESTEROL, DIRECT: LDL Direct: 91 mg/dL (ref 0–99)

## 2020-11-19 LAB — MAGNESIUM: Magnesium: 1.9 mg/dL (ref 1.6–2.3)

## 2020-11-19 LAB — TSH: TSH: 1.77 u[IU]/mL (ref 0.450–4.500)

## 2020-11-19 NOTE — Patient Instructions (Addendum)
Medication Instructions:  Your physician recommends that you continue on your current medications as directed. Please refer to the Current Medication list given to you today.  *If you need a refill on your cardiac medications before your next appointment, please call your pharmacy*   Lab Work: TODAY:  CMET, MAG, LIPID, CBC, THS, & DIRECT LDL  If you have labs (blood work) drawn today and your tests are completely normal, you will receive your results only by: MyChart Message (if you have MyChart) OR A paper copy in the mail If you have any lab test that is abnormal or we need to change your treatment, we will call you to review the results.   Testing/Procedures: None ordered   Follow-Up: At [facility], you and your health needs are our priority.  As part of our continuing mission to provide you with exceptional heart care, we have created designated Provider Care Teams.  These Care Teams include your primary Cardiologist (physician) and Advanced Practice Providers (APPs -  Physician Assistants and Nurse Practitioners) who all work together to provide you with the care you need, when you need it.  We recommend signing up for the patient portal called "MyChart".  Sign up information is provided on this After Visit Summary.  MyChart is used to connect with patients for Virtual Visits (Telemedicine).  Patients are able to view lab/test results, encounter notes, upcoming appointments, etc.  Non-urgent messages can be sent to your provider as well.   To learn more about what you can do with MyChart, go to [email or web address].    Your next appointment:   12 month(s)  The format for your next appointment:   In Person  Provider:   You may see Charlton Haws, MD or one of the following Advanced Practice Providers on your designated Care Team:   Nada Boozer, NP    Other Instructions

## 2020-11-20 ENCOUNTER — Telehealth: Payer: Self-pay

## 2020-11-20 DIAGNOSIS — E876 Hypokalemia: Secondary | ICD-10-CM

## 2020-11-20 MED ORDER — MAGNESIUM OXIDE 400 MG PO TABS: 400.0000 mg | ORAL_TABLET | Freq: Every day | ORAL | 3 refills | Status: DC

## 2020-11-20 MED ORDER — POTASSIUM CHLORIDE CRYS ER 20 MEQ PO TBCR: 40.0000 meq | EXTENDED_RELEASE_TABLET | Freq: Every day | ORAL | 3 refills | Status: DC

## 2020-11-20 MED ORDER — ATORVASTATIN CALCIUM 40 MG PO TABS: 40.0000 mg | ORAL_TABLET | Freq: Every day | ORAL | 3 refills | Status: AC

## 2020-11-20 NOTE — Telephone Encounter (Signed)
-----   Message from Laurann Montana, [place] sent at 11/20/2020  6:53 AM EDT ----- Please let pt know overall labs look good! Just a few abnormalities. Potassium level low and magnesium level just below the goal levels. His LDL bad cholesterol number looks okay but not at goal - want this less than 70. Plan: - add KCl take 2 tablets daily - add mag ox 400mg  daily  - increase magnesium/potassium rich foods - recheck BMET/Mg 1 week - increase atorvastatin to 40mg  daily  - recheck CMET/lipids in 8 weeks (fasting)

## 2020-11-20 NOTE — Telephone Encounter (Signed)
Patient aware of results. Will schedule patinet to get lab work next Friday. Patient stated he sees his PCP in November and will get lab work done with his office. Updated patient's medications and sent new prescriptions to patient's pharmacy of choice. Will send message to patient's PCP about needing CMET and lipids in 8 weeks.

## 2020-11-23 NOTE — Addendum Note (Signed)
Addended by: Burnetta Sabin on: 11/23/2020 08:14 AM   Modules accepted: Orders

## 2020-11-25 ENCOUNTER — Telehealth: Payer: Self-pay | Admitting: Cardiovascular Disease

## 2020-11-25 NOTE — Telephone Encounter (Signed)
Goal Mg while on flecainide is 2.0. Last value 1.9, prompting initiation of magnesium. Patient's potassium was also low on labs 9/29 at 3.4, prompting initiation of potassium. If he chooses not to take the magnesium supplement as advised, still needs repeat BMET as planned.  It is important to maintain normal electrolytes on flecainide therapy.

## 2020-11-25 NOTE — Telephone Encounter (Signed)
Pt c/o medication issue:  1. Name of Medication: magnesium oxide (MAG-OX) 400 MG tablet  2. How are you currently taking this medication (dosage and times per day)?   3. Are you having a reaction (difficulty breathing--STAT)? NO  4. What is your medication issue? PT WANTS TO KNOW IF HE NEEDS TO CONTINUE TO TAKE THIS MEDICINE BECAUSE HE WAS TOLD HIS BLOOD WORK IS GOOD

## 2020-11-25 NOTE — Telephone Encounter (Signed)
Called patient back to let him know that he needs to take magnesium since his level was on the low side. Patient stated it was normal and he is not going to take any magnesium. Informed him that the provider would be made aware.

## 2020-11-26 NOTE — Telephone Encounter (Signed)
Spoke with pt regarding message below.  Pt has been made aware that the goal Mag while on flecainide is 2.0. Last value 1.9, prompting initiation of magnesium.His potassium was also low on labs 9/29 at 3.4, prompting initiation of potassium. He is aware that if he chooses not to take the magnesium supplement as advised, still needs repeat BMET as planned.   Pt rescheduled for Tuesday, 12/01/2020.  Pt has also been  made aware that it is important to maintain normal electrolytes on flecainide therapy.

## 2020-11-27 ENCOUNTER — Other Ambulatory Visit: Payer: PPO

## 2020-12-01 ENCOUNTER — Other Ambulatory Visit: Payer: Self-pay

## 2020-12-01 ENCOUNTER — Other Ambulatory Visit: Payer: PPO | Admitting: *Deleted

## 2020-12-01 DIAGNOSIS — E876 Hypokalemia: Secondary | ICD-10-CM | POA: Diagnosis not present

## 2020-12-01 LAB — BASIC METABOLIC PANEL
BUN/Creatinine Ratio: 14 (ref 10–24)
BUN: 18 mg/dL (ref 8–27)
CO2: 23 mmol/L (ref 20–29)
Calcium: 9.7 mg/dL (ref 8.6–10.2)
Chloride: 105 mmol/L (ref 96–106)
Creatinine, Ser: 1.27 mg/dL (ref 0.76–1.27)
Glucose: 100 mg/dL — ABNORMAL HIGH (ref 70–99)
Potassium: 4.6 mmol/L (ref 3.5–5.2)
Sodium: 138 mmol/L (ref 134–144)
eGFR: 60 mL/min/{1.73_m2} (ref >59)

## 2020-12-01 LAB — MAGNESIUM: Magnesium: 2.1 mg/dL (ref 1.6–2.3)

## 2020-12-02 ENCOUNTER — Telehealth: Payer: Self-pay | Admitting: *Deleted

## 2020-12-02 DIAGNOSIS — Z79899 Other long term (current) drug therapy: Secondary | ICD-10-CM

## 2020-12-02 MED ORDER — POTASSIUM CHLORIDE CRYS ER 20 MEQ PO TBCR: 20.0000 meq | EXTENDED_RELEASE_TABLET | Freq: Every day | ORAL | 3 refills | Status: DC

## 2020-12-02 NOTE — Telephone Encounter (Signed)
-----   Message from Laurann Montana, [place] sent at 12/02/2020  7:47 AM EDT ----- Please let patient know labs look good on present regimen and his potassium and magnesium are now normal. His potassium actually crept up more than expected so he can drop potassium dose to daily and recheck BMET 2 weeks to ensure stable.  No need to repeat magnesium at that time as it is normal (please confirm whether he's taking the supplement given his hesitancy in the other phone note). Watching kidney function as well since kidney function is right on the border of normal/abnormal. Make sure to stay hydrated.

## 2020-12-16 ENCOUNTER — Other Ambulatory Visit: Payer: PPO | Admitting: *Deleted

## 2020-12-16 ENCOUNTER — Other Ambulatory Visit: Payer: Self-pay

## 2020-12-16 DIAGNOSIS — Z79899 Other long term (current) drug therapy: Secondary | ICD-10-CM

## 2020-12-16 LAB — BASIC METABOLIC PANEL
BUN/Creatinine Ratio: 16 (ref 10–24)
BUN: 17 mg/dL (ref 8–27)
CO2: 20 mmol/L (ref 20–29)
Calcium: 9.1 mg/dL (ref 8.6–10.2)
Chloride: 109 mmol/L — ABNORMAL HIGH (ref 96–106)
Creatinine, Ser: 1.08 mg/dL (ref 0.76–1.27)
Glucose: 106 mg/dL — ABNORMAL HIGH (ref 70–99)
Potassium: 3.8 mmol/L (ref 3.5–5.2)
Sodium: 141 mmol/L (ref 134–144)
eGFR: 73 mL/min/{1.73_m2} (ref >59)

## 2021-01-11 DIAGNOSIS — G5601 Carpal tunnel syndrome, right upper limb: Secondary | ICD-10-CM | POA: Diagnosis not present

## 2021-01-20 DIAGNOSIS — E785 Hyperlipidemia, unspecified: Secondary | ICD-10-CM | POA: Diagnosis not present

## 2021-01-20 DIAGNOSIS — I10 Essential (primary) hypertension: Secondary | ICD-10-CM | POA: Diagnosis not present

## 2021-01-20 DIAGNOSIS — I700 Atherosclerosis of aorta: Secondary | ICD-10-CM | POA: Diagnosis not present

## 2021-01-20 DIAGNOSIS — D6869 Other thrombophilia: Secondary | ICD-10-CM | POA: Diagnosis not present

## 2021-01-20 DIAGNOSIS — I4891 Unspecified atrial fibrillation: Secondary | ICD-10-CM | POA: Diagnosis not present

## 2021-01-20 DIAGNOSIS — Z0000 Encounter for general adult medical examination without abnormal findings: Secondary | ICD-10-CM | POA: Diagnosis not present

## 2021-01-20 DIAGNOSIS — R7309 Other abnormal glucose: Secondary | ICD-10-CM | POA: Diagnosis not present

## 2021-01-20 DIAGNOSIS — E559 Vitamin D deficiency, unspecified: Secondary | ICD-10-CM | POA: Diagnosis not present

## 2021-01-25 DIAGNOSIS — Z7901 Long term (current) use of anticoagulants: Secondary | ICD-10-CM | POA: Diagnosis not present

## 2021-01-25 DIAGNOSIS — I4891 Unspecified atrial fibrillation: Secondary | ICD-10-CM | POA: Diagnosis not present

## 2021-01-25 DIAGNOSIS — I10 Essential (primary) hypertension: Secondary | ICD-10-CM | POA: Diagnosis not present

## 2021-01-25 DIAGNOSIS — Z6831 Body mass index (BMI) 31.0-31.9, adult: Secondary | ICD-10-CM | POA: Diagnosis not present

## 2021-01-25 DIAGNOSIS — E785 Hyperlipidemia, unspecified: Secondary | ICD-10-CM | POA: Diagnosis not present

## 2021-01-25 DIAGNOSIS — R000 Tachycardia, unspecified: Secondary | ICD-10-CM | POA: Diagnosis not present

## 2021-01-25 DIAGNOSIS — R55 Syncope and collapse: Secondary | ICD-10-CM | POA: Diagnosis not present

## 2021-01-25 DIAGNOSIS — Z20822 Contact with and (suspected) exposure to covid-19: Secondary | ICD-10-CM | POA: Diagnosis not present

## 2021-01-25 DIAGNOSIS — J101 Influenza due to other identified influenza virus with other respiratory manifestations: Secondary | ICD-10-CM | POA: Diagnosis not present

## 2021-01-25 DIAGNOSIS — R7303 Prediabetes: Secondary | ICD-10-CM | POA: Diagnosis not present

## 2021-01-25 DIAGNOSIS — E6609 Other obesity due to excess calories: Secondary | ICD-10-CM | POA: Diagnosis not present

## 2021-01-25 DIAGNOSIS — R079 Chest pain, unspecified: Secondary | ICD-10-CM | POA: Diagnosis not present

## 2021-01-25 DIAGNOSIS — K449 Diaphragmatic hernia without obstruction or gangrene: Secondary | ICD-10-CM | POA: Diagnosis not present

## 2021-01-25 DIAGNOSIS — J09X2 Influenza due to identified novel influenza A virus with other respiratory manifestations: Secondary | ICD-10-CM | POA: Diagnosis not present

## 2021-01-25 DIAGNOSIS — Z79899 Other long term (current) drug therapy: Secondary | ICD-10-CM | POA: Diagnosis not present

## 2021-01-26 DIAGNOSIS — R55 Syncope and collapse: Secondary | ICD-10-CM | POA: Diagnosis not present

## 2021-01-27 DIAGNOSIS — R55 Syncope and collapse: Secondary | ICD-10-CM | POA: Diagnosis not present

## 2021-02-05 DIAGNOSIS — D649 Anemia, unspecified: Secondary | ICD-10-CM | POA: Diagnosis not present

## 2021-02-05 DIAGNOSIS — K219 Gastro-esophageal reflux disease without esophagitis: Secondary | ICD-10-CM | POA: Diagnosis not present

## 2021-02-05 DIAGNOSIS — E785 Hyperlipidemia, unspecified: Secondary | ICD-10-CM | POA: Diagnosis not present

## 2021-02-05 DIAGNOSIS — I4891 Unspecified atrial fibrillation: Secondary | ICD-10-CM | POA: Diagnosis not present

## 2021-02-05 DIAGNOSIS — D509 Iron deficiency anemia, unspecified: Secondary | ICD-10-CM | POA: Diagnosis not present

## 2021-02-05 DIAGNOSIS — I10 Essential (primary) hypertension: Secondary | ICD-10-CM | POA: Diagnosis not present

## 2021-04-14 DIAGNOSIS — H2513 Age-related nuclear cataract, bilateral: Secondary | ICD-10-CM | POA: Diagnosis not present

## 2021-04-28 ENCOUNTER — Other Ambulatory Visit (HOSPITAL_COMMUNITY): Payer: Self-pay | Admitting: Nurse Practitioner

## 2021-04-28 NOTE — Telephone Encounter (Signed)
This medication is refill in the A-Fib clinic. Pt is still a pt there. Pt has not been released form the A-Fib clinic yet. Please address ?

## 2021-06-01 DIAGNOSIS — I4891 Unspecified atrial fibrillation: Secondary | ICD-10-CM | POA: Diagnosis not present

## 2021-06-01 DIAGNOSIS — D649 Anemia, unspecified: Secondary | ICD-10-CM | POA: Diagnosis not present

## 2021-06-01 DIAGNOSIS — E785 Hyperlipidemia, unspecified: Secondary | ICD-10-CM | POA: Diagnosis not present

## 2021-06-01 DIAGNOSIS — K219 Gastro-esophageal reflux disease without esophagitis: Secondary | ICD-10-CM | POA: Diagnosis not present

## 2021-06-01 DIAGNOSIS — I10 Essential (primary) hypertension: Secondary | ICD-10-CM | POA: Diagnosis not present

## 2021-06-14 DIAGNOSIS — L608 Other nail disorders: Secondary | ICD-10-CM | POA: Diagnosis not present

## 2021-06-14 DIAGNOSIS — L609 Nail disorder, unspecified: Secondary | ICD-10-CM | POA: Diagnosis not present

## 2021-06-15 ENCOUNTER — Ambulatory Visit: Payer: PPO | Admitting: Podiatry

## 2021-06-15 DIAGNOSIS — S90222A Contusion of left lesser toe(s) with damage to nail, initial encounter: Secondary | ICD-10-CM

## 2021-06-15 DIAGNOSIS — L601 Onycholysis: Secondary | ICD-10-CM | POA: Diagnosis not present

## 2021-06-15 MED ORDER — DOXYCYCLINE HYCLATE 100 MG PO TABS
100.0000 mg | ORAL_TABLET | Freq: Two times a day (BID) | ORAL | 0 refills | Status: DC
Start: 2021-06-15 — End: 2021-09-08

## 2021-06-15 NOTE — Patient Instructions (Signed)

## 2021-06-17 ENCOUNTER — Other Ambulatory Visit (HOSPITAL_COMMUNITY): Payer: Self-pay | Admitting: Nurse Practitioner

## 2021-06-21 ENCOUNTER — Telehealth: Payer: Self-pay | Admitting: *Deleted

## 2021-06-21 NOTE — Telephone Encounter (Signed)
Error message

## 2021-06-24 NOTE — Progress Notes (Signed)
Subjective:  ? ?Patient ID: Andrew Perkins, male   DOB: 71 y.o.   MRN: [id number]  ? ?HPI ?71 year old male presents the office today for concerns of left big toenail started hurting and turning darker in color.  He states that at that Saturday he started a lot of walking he noticed that shoes were somewhat uncomfortable and on some days when he noticed that the nail was hurting had the discoloration underneath the toenail.  Denies any drainage or pus. ? ? ?Review of Systems  ?All other systems reviewed and are negative. ? ?Past Medical History:  ?Diagnosis Date  ? Anemia   ? a. Mild per pt - previously on iron but did not tolerate.  ? Chronic neck pain 06/27/2017  ? Diabetes mellitus without complication ([place])   ? borderline  ? Erectile dysfunction   ? Former consumption of alcohol   ? a. Abstinent as of 03/2013.  ? Hyperlipidemia   ? Hypertension   ? Left atrial enlargement   ? Mild aortic insufficiency   ? PAF (paroxysmal atrial fibrillation) ([place])   ? SVT (supraventricular tachycardia) ([place])   ? Vitamin D deficiency   ? ? ?Past Surgical History:  ?Procedure Laterality Date  ? CIRCUMCISION    ? at 71yrs old  ? TONSILLECTOMY    ? WISDOM TOOTH EXTRACTION    ? ? ? ?Current Outpatient Medications:  ?  doxycycline (VIBRA-TABS) 100 MG tablet, Take 1 tablet (100 mg total) by mouth 2 (two) times daily., Disp: 14 tablet, Rfl: 0 ?  amLODipine (NORVASC) 5 MG tablet, Take 5 mg by mouth daily., Disp: , Rfl:  ?  apixaban (ELIQUIS) 5 MG TABS tablet, Take 1 tablet (5 mg total) by mouth 2 (two) times daily., Disp: 180 tablet, Rfl: 3 ?  Ascorbic Acid (VITAMIN C PO), Take 1 tablet by mouth daily. Not sure the dose, Disp: , Rfl:  ?  atorvastatin (LIPITOR) 40 MG tablet, Take 1 tablet (40 mg total) by mouth daily., Disp: 90 tablet, Rfl: 3 ?  diclofenac sodium (VOLTAREN) 1 % GEL, Apply 1 application topically daily as needed for pain. , Disp: , Rfl:  ?  famotidine (PEPCID) 10 MG tablet, Take 10 mg by mouth as needed. , Disp: , Rfl:  ?   flecainide (TAMBOCOR) 100 MG tablet, Take 1 tablet by mouth twice a day, Disp: 180 tablet, Rfl: 1 ?  losartan (COZAAR) 100 MG tablet, Take 100 mg by mouth daily., Disp: , Rfl:  ?  meloxicam (MOBIC) 7.5 MG tablet, Take 7.5 mg by mouth daily. (Patient not taking: Reported on 11/19/2020), Disp: , Rfl:  ?  metoprolol tartrate (LOPRESSOR) 25 MG tablet, Take 1 tablet by mouth twice a day, Disp: 180 tablet, Rfl: 2 ?  pantoprazole (PROTONIX) 40 MG tablet, Take 40 mg by mouth as needed. , Disp: , Rfl:  ?  potassium chloride SA (KLOR-CON) 20 MEQ tablet, Take 1 tablet (20 mEq total) by mouth daily., Disp: 90 tablet, Rfl: 3 ?  psyllium (HYDROCIL/METAMUCIL) 95 % PACK, Take 1 packet by mouth daily., Disp: , Rfl:  ?  zinc gluconate 50 MG tablet, Take 50 mg by mouth daily., Disp: , Rfl:  ? ?Allergies  ?Allergen Reactions  ? Penicillins Other (See Comments)  ?  syncope  ? ? ? ? ?   ?Objective:  ?Physical Exam  ?General: AAO x3, NAD ? ?Dermatological: Left hallux toenail is loose with underlying nailbed and there appears to be subungual hematoma to the entire toenail.  It  is loose in the proximal nail fold.  I did initially trim the nail and serosanguineous drainage was expressed.  Due to this decide to proceed with nail removal.  See procedure note below.  Localized edema of the nail border without any cellulitis noted. ? ?Vascular: Dorsalis Pedis artery and Posterior Tibial artery pedal pulses are 2/4 bilateral with immedate capillary fill time. There is no pain with calf compression, swelling, warmth, erythema.  ? ?Neruologic: Grossly intact via light touch bilateral.  ? ?Musculoskeletal: Tenderness to the hallux toenail the left side but no other areas of discomfort.  Muscular strength 5/5 in all groups tested bilateral. ? ?Gait: Unassisted, Nonantalgic.  ? ? ?   ?Assessment:  ? ?Left hallux onycholysis, subungual hematoma ? ?   ?Plan:  ?-Treatment options discussed including all alternatives, risks, and complications ?-Etiology  of symptoms were discussed ?-Given the amount of blood under the toenail the nail is already lifting up with the drainage of the recommended to proceed with total nail avulsion.  We discussed risks of the procedure as well as postoperative course and he agrees and consent was signed.  Skin was cleaned with alcohol and mixture of 3 cc of lidocaine, Marcaine plain was infiltrated in a digital block fashion.  Once anesthetized the skin was prepped with Betadine and tourniquet was applied.  The nail was removed in total.  The underlying nailbed intact with any lacerations.  There is no purulence.  Hemostasis was achieved.  Irrigated.  Silvadene was applied followed by dressing.  Tolerated procedure without any complications.  Postprocedure care discussed. ?-Doxycycline ? ?Trula Slade DPM ? ?   ? ?

## 2021-07-20 DIAGNOSIS — R7309 Other abnormal glucose: Secondary | ICD-10-CM | POA: Diagnosis not present

## 2021-07-20 DIAGNOSIS — E559 Vitamin D deficiency, unspecified: Secondary | ICD-10-CM | POA: Diagnosis not present

## 2021-07-20 DIAGNOSIS — D649 Anemia, unspecified: Secondary | ICD-10-CM | POA: Diagnosis not present

## 2021-07-20 DIAGNOSIS — I4891 Unspecified atrial fibrillation: Secondary | ICD-10-CM | POA: Diagnosis not present

## 2021-07-20 DIAGNOSIS — I10 Essential (primary) hypertension: Secondary | ICD-10-CM | POA: Diagnosis not present

## 2021-07-20 DIAGNOSIS — L609 Nail disorder, unspecified: Secondary | ICD-10-CM | POA: Diagnosis not present

## 2021-07-20 DIAGNOSIS — H9319 Tinnitus, unspecified ear: Secondary | ICD-10-CM | POA: Diagnosis not present

## 2021-07-20 DIAGNOSIS — I700 Atherosclerosis of aorta: Secondary | ICD-10-CM | POA: Diagnosis not present

## 2021-09-02 DIAGNOSIS — I10 Essential (primary) hypertension: Secondary | ICD-10-CM | POA: Diagnosis not present

## 2021-09-02 DIAGNOSIS — M542 Cervicalgia: Secondary | ICD-10-CM | POA: Diagnosis not present

## 2021-09-02 DIAGNOSIS — F1011 Alcohol abuse, in remission: Secondary | ICD-10-CM | POA: Diagnosis not present

## 2021-09-02 DIAGNOSIS — M21611 Bunion of right foot: Secondary | ICD-10-CM | POA: Diagnosis not present

## 2021-09-02 DIAGNOSIS — Z1331 Encounter for screening for depression: Secondary | ICD-10-CM | POA: Diagnosis not present

## 2021-09-02 DIAGNOSIS — I700 Atherosclerosis of aorta: Secondary | ICD-10-CM | POA: Diagnosis not present

## 2021-09-02 DIAGNOSIS — E559 Vitamin D deficiency, unspecified: Secondary | ICD-10-CM | POA: Diagnosis not present

## 2021-09-02 DIAGNOSIS — R7309 Other abnormal glucose: Secondary | ICD-10-CM | POA: Diagnosis not present

## 2021-09-02 DIAGNOSIS — I4891 Unspecified atrial fibrillation: Secondary | ICD-10-CM | POA: Diagnosis not present

## 2021-09-02 DIAGNOSIS — M21612 Bunion of left foot: Secondary | ICD-10-CM | POA: Diagnosis not present

## 2021-09-02 DIAGNOSIS — E785 Hyperlipidemia, unspecified: Secondary | ICD-10-CM | POA: Diagnosis not present

## 2021-09-08 ENCOUNTER — Ambulatory Visit (HOSPITAL_COMMUNITY)
Admission: RE | Admit: 2021-09-08 | Discharge: 2021-09-08 | Disposition: A | Discharge status: Home / Self Care | Payer: PPO | Source: Ambulatory Visit | Attending: Nurse Practitioner | Admitting: Nurse Practitioner

## 2021-09-08 ENCOUNTER — Encounter (HOSPITAL_COMMUNITY): Payer: Self-pay | Admitting: Nurse Practitioner

## 2021-09-08 VITALS — BP 134/72 | HR 56 | Ht 75.0 in | Wt 267.2 lb

## 2021-09-08 DIAGNOSIS — R001 Bradycardia, unspecified: Secondary | ICD-10-CM | POA: Diagnosis not present

## 2021-09-08 DIAGNOSIS — I480 Paroxysmal atrial fibrillation: Secondary | ICD-10-CM | POA: Insufficient documentation

## 2021-09-08 DIAGNOSIS — D6869 Other thrombophilia: Secondary | ICD-10-CM | POA: Diagnosis not present

## 2021-09-08 DIAGNOSIS — Z8616 Personal history of COVID-19: Secondary | ICD-10-CM | POA: Diagnosis not present

## 2021-09-08 DIAGNOSIS — R002 Palpitations: Secondary | ICD-10-CM | POA: Insufficient documentation

## 2021-09-08 DIAGNOSIS — Z7901 Long term (current) use of anticoagulants: Secondary | ICD-10-CM | POA: Insufficient documentation

## 2021-09-08 DIAGNOSIS — I471 Supraventricular tachycardia: Secondary | ICD-10-CM | POA: Diagnosis not present

## 2021-09-08 DIAGNOSIS — I440 Atrioventricular block, first degree: Secondary | ICD-10-CM | POA: Insufficient documentation

## 2021-09-08 DIAGNOSIS — Z880 Allergy status to penicillin: Secondary | ICD-10-CM | POA: Insufficient documentation

## 2021-09-08 DIAGNOSIS — I10 Essential (primary) hypertension: Secondary | ICD-10-CM | POA: Diagnosis not present

## 2021-09-08 DIAGNOSIS — Z79899 Other long term (current) drug therapy: Secondary | ICD-10-CM | POA: Diagnosis not present

## 2021-09-08 DIAGNOSIS — D649 Anemia, unspecified: Secondary | ICD-10-CM | POA: Insufficient documentation

## 2021-09-08 DIAGNOSIS — I4891 Unspecified atrial fibrillation: Secondary | ICD-10-CM | POA: Diagnosis not present

## 2021-09-08 MED ORDER — DILTIAZEM HCL 30 MG PO TABS: ORAL_TABLET | ORAL | 1 refills | Status: AC

## 2021-09-08 NOTE — Patient Instructions (Signed)
Cardizem 30mg -- take 1 tablet every 4 hours AS NEEDED for AFIB heart rate >100 as long as top number of blood pressure >100.   ?

## 2021-09-08 NOTE — Progress Notes (Signed)
Patient ID: Andrew Perkins, male   DOB: 1950/08/17, 72 y.o.   MRN: [id number]     Patient ID: Andrew Perkins, male   DOB: 11/09/1950, 71 y.o.   MRN: [id number]   Date:  09/08/2021   PCP:  Andrew Has, MD  Cardiologist:  Dr Andrew Perkins Primary Electrophysiologist: Andrew Perkins   One year f/u flecainide  use   History of Present Illness: Andrew Perkins is a 71 y.o. male who presents today for f/u in the afib clinic.   The patient reports initially being diagnosed with atrial fibrillation 2/15 after presenting with tachypalpitations.  In retrospect, he thinks that he Perkins had afib for 4-5 years.  Episodes were initially short, lasting several minutes.  At that time, he attributed them to "anxiety".  He developed sustained tachycardia 2/15 and he presented to Dr Andrew Perkins office with documented afib on ekg.  He was placed on eliquis and metoprolol.  He reports increasing frequency and duration of atrial fibrillation.  Several months ago, he was given flecainide as a "pill in pocket" approach.  He Perkins required flecainide several times since then.  He had an event monitor placed 2/16 which Perkins documented a more regular narrow complex SVT.  He Perkins not tried daily AAD therapy though  He does take metoprolol twice daily.  He Perkins very frequent palpitations which he finds worrisome.  He saw Dr. Johney Perkins in consultation 4/16 and was given options to treat afib. He wanted to pursue AAD and avoid ablation.  He was started on flecainide 100 mg bid . He appears to be tolerating without adverse effects and most importantly Perkins very little afib burden on drug. He noticed afib for one hour last Sunday and this is the most afib that he Perkins noticed.  He reports  negative sleep study. He does try to walk daily. Remains on DOAC with chadsvasc score of 2. He states that he does have mild anemia and takes iron. He Perkins had a colonoscopy in the last year and did have polys which were removed.  F/u clinic 07/1515, continue on flecainide and is  doing will with litle afib burden. He report that he had 3 episode in early April, longest epiosde was 3 hours at v rate of 102 bpm, next episode 3 days later at 115 bpm, lasted one hour, last epiosde lasted 5 mins, at 99 bpm. Perkins not any further episodes since that 8th of April. He is still satisfied with afib management. He can now continue with what he is doing in afib vrs getting lightheaded and having to lay down.  Continues on eliquis, no bleeding issues.  F/u in afib clinic 12/08/15 and reports small afib burden. He is till happy with afib management. He is walking on a regular basis. Afib episodes can last 5 mins to an hour. No bleeding issues with eliquis.  F/u in afib clinc 04/07/16. He reports increase in afib burden over the last week because he traveled out of town and was out of his usual routine with diet and sleeping. He still is happy with flecainide and wants to continue with drug.   Return to afib clinic 7/18. Continues with flecainide and reports very little afib burden. Feels well. Continues with eliquis 5 mg bid, no bleeding issues currently.  F/u 10/25/17,he reports very little afib burden Is happy with current management with flecainide.. No bleeding issues. Had labs drawn with PCP yesterday.  F/u afib clinic 11/15/18. He is still happy with his afib burden on flecainide.  He Perkins 1-2 outbreaks a month, lasting around one hour. He does not have to stop what he is doing when he is in afib. He usually knows only  because of higher heart rates on his watch.   F/u in afib clinic, 11/18/19. He in Sinus brady at 57 bpm, continues on flecainide and BB. He guesstimates around 10 afib episodes over the last year. Most episodes are short, 10-15 mins and his longest episode was around 6 hours. He  took an extra flecainide and BB and he converted. Continues  on eliquis 5 mg bid for a CHA2DS2VASc score of 2.   F/u in afib clinic, 09/01/20. He reports that he was visiting his daughter in [place]  middle of June, he had chills with elevated heart rate. He went to the ER there and tested COVID positive. He was in sinus tachycardia, EKG results reviewed in Epic. [name] He had elevated HR and chills for another 24 hours and then his symptoms became minimal. He Perkins noted some short elevations of heart rate since then. Apple strips reviewed and appear to show sinus tach. He will occassionally will take a prn metoprolol.   F/u in the afib clinic, 09/08/21. He reports since July 5th he Perkins had 3 afib episodes usually lasting around 4-6 hours. He will  take an extra metoprolol. No specific trigger found. No alcohol use. He is in SR today.  Today, he denies symptoms of chest pain, shortness of breath, orthopnea, PND, lower extremity edema, claudication, dizziness, presyncope, syncope, bleeding, or neurologic sequela. The patient is tolerating medications without difficulties and is otherwise without complaint today.    Past Medical History:  Diagnosis Date   Anemia    a. Mild per pt - previously on iron but did not tolerate.   Chronic neck pain 06/27/2017   Diabetes mellitus without complication (HCC)    borderline   Erectile dysfunction    Former consumption of alcohol    a. Abstinent as of 03/2013.   Hyperlipidemia    Hypertension    Left atrial enlargement    Mild aortic insufficiency    PAF (paroxysmal atrial fibrillation) (HCC)    SVT (supraventricular tachycardia) (HCC)    Vitamin D deficiency    Past Surgical History:  Procedure Laterality Date   CIRCUMCISION     at 71yrs old   TONSILLECTOMY     WISDOM TOOTH EXTRACTION       Current Outpatient Medications  Medication Sig Dispense Refill   amLODipine (NORVASC) 5 MG tablet Take 5 mg by mouth daily.     apixaban (ELIQUIS) 5 MG TABS tablet Take 1 tablet (5 mg total) by mouth 2 (two) times daily. 180 tablet 3   atorvastatin (LIPITOR) 40 MG tablet Take 1 tablet (40 mg total) by mouth daily. 90 tablet 3   diclofenac sodium (VOLTAREN) 1  % GEL Apply 1 application topically daily as needed for pain.      diltiazem (CARDIZEM) 30 MG tablet Take 1 tablet every 4 hours AS NEEDED for heart rate >100 30 tablet 1   famotidine (PEPCID) 10 MG tablet Take 10 mg by mouth as needed.      flecainide (TAMBOCOR) 100 MG tablet Take 1 tablet by mouth twice a day 180 tablet 1   losartan (COZAAR) 100 MG tablet Take 100 mg by mouth daily.     metoprolol tartrate (LOPRESSOR) 25 MG tablet Take 1 tablet by mouth twice a day 180 tablet 2   pantoprazole (PROTONIX) 40 MG  tablet Take 40 mg by mouth as needed.      potassium chloride SA (KLOR-CON) 20 MEQ tablet Take 1 tablet (20 mEq total) by mouth daily. 90 tablet 3   psyllium (HYDROCIL/METAMUCIL) 95 % PACK Take 1 packet by mouth daily.     Ascorbic Acid (VITAMIN C PO) Take 1 tablet by mouth daily. Not sure the dose (Patient not taking: Reported on 09/08/2021)     zinc gluconate 50 MG tablet Take 50 mg by mouth daily. (Patient not taking: Reported on 09/08/2021)     No current facility-administered medications for this encounter.    Allergies:   Penicillins   Social History:  The patient  reports that he Perkins never smoked. He Perkins never used smokeless tobacco. He reports that he does not drink alcohol and does not use drugs.   Family History:  The patient's  family history includes Asthma in his brother; Diabetes Mellitus II in his brother.    ROS:  Please see the history of present illness.   All other systems are reviewed and negative.    PHYSICAL EXAM: VS:  BP 134/72   Pulse (!) 56   Ht 6\' 3"  (1.905 m)   Wt 121.2 kg   BMI 33.40 kg/m  , BMI Body mass index is 33.4 kg/m. GEN: overweight in no acute distress  HEENT: normal  Neck: no JVD, carotid bruits, or masses Cardiac: RRR; no murmurs, rubs, or gallops,no edema  Respiratory:  clear to auscultation bilaterally, normal work of breathing GI: soft, nontender, nondistended, + BS MS: no deformity or atrophy  Skin: warm and dry  Neuro:   Strength and sensation are intact Psych: euthymic mood, full affect  EKG:  Vent. rate 56 BPM PR interval 218 ms QRS duration 112 ms QT/QTcB 470/453 ms P-R-T axes 37 -8 47 Sinus bradycardia with 1st degree A-V block Moderate voltage criteria for LVH, may be normal variant ( R in aVL , Cornell product ) Borderline ECG When compared with ECG of 01-Sep-2020 09:14, PREVIOUS ECG IS PRESENT    Wt Readings from Last 3 Encounters:  09/08/21 121.2 kg  11/19/20 118.4 kg  09/01/20 117.5 kg       ASSESSMENT AND PLAN:  1.  Paroxysmal atrial fibrillation/ SVT Low burden until this month We discussed an ablation vrs change to tikosyn For now he wants to hold the course  Continue flecainide 100 mg bid/metoprolol 25 mg bid. I will rx 30 mg diltiazem to use as needed for breakthrough afib  Continue eliquis 5 mg bid  for chads2vasc score of at least 2  2. HTN Stable  Avoid salt   I will see back in 6 weeks  If afib burden Perkins not decreased, we will discuss options again  at that time to maintain SR     Jaedin Regina C. 11/02/20 Afib Clinic [facility] [address] [place], [place] [phone]

## 2021-09-27 DIAGNOSIS — I10 Essential (primary) hypertension: Secondary | ICD-10-CM | POA: Diagnosis not present

## 2021-09-27 DIAGNOSIS — K219 Gastro-esophageal reflux disease without esophagitis: Secondary | ICD-10-CM | POA: Diagnosis not present

## 2021-09-27 DIAGNOSIS — E785 Hyperlipidemia, unspecified: Secondary | ICD-10-CM | POA: Diagnosis not present

## 2021-10-20 ENCOUNTER — Encounter (HOSPITAL_COMMUNITY): Payer: Self-pay | Admitting: Nurse Practitioner

## 2021-10-20 ENCOUNTER — Ambulatory Visit (HOSPITAL_COMMUNITY)
Admission: RE | Admit: 2021-10-20 | Discharge: 2021-10-20 | Disposition: A | Discharge status: Home / Self Care | Payer: PPO | Source: Ambulatory Visit | Attending: Nurse Practitioner | Admitting: Nurse Practitioner

## 2021-10-20 VITALS — BP 140/72 | HR 56 | Ht 75.0 in | Wt 267.6 lb

## 2021-10-20 DIAGNOSIS — I4891 Unspecified atrial fibrillation: Secondary | ICD-10-CM | POA: Diagnosis not present

## 2021-10-20 DIAGNOSIS — I10 Essential (primary) hypertension: Secondary | ICD-10-CM | POA: Insufficient documentation

## 2021-10-20 DIAGNOSIS — F419 Anxiety disorder, unspecified: Secondary | ICD-10-CM | POA: Insufficient documentation

## 2021-10-20 DIAGNOSIS — D6869 Other thrombophilia: Secondary | ICD-10-CM

## 2021-10-20 DIAGNOSIS — I480 Paroxysmal atrial fibrillation: Secondary | ICD-10-CM | POA: Insufficient documentation

## 2021-10-20 DIAGNOSIS — I471 Supraventricular tachycardia: Secondary | ICD-10-CM | POA: Insufficient documentation

## 2021-10-20 DIAGNOSIS — Z79899 Other long term (current) drug therapy: Secondary | ICD-10-CM | POA: Insufficient documentation

## 2021-10-20 NOTE — Progress Notes (Signed)
Patient ID: Andrew Perkins, male   DOB: 1950-12-30, 71 y.o.   MRN: [id number]     Patient ID: Andrew Perkins, male   DOB: [birth year], 71 y.o.   MRN: [id number]   Date:  10/20/2021   PCP:  London Pepper, MD  Cardiologist:  Dr Johnsie Cancel Primary Electrophysiologist: Thompson Grayer   One year f/u flecainide  use   History of Present Illness: Andrew Perkins is a 71 y.o. male who presents today for f/u in the afib clinic.   The patient reports initially being diagnosed with atrial fibrillation 2/15 after presenting with tachypalpitations.  In retrospect, he thinks that he has had afib for 4-5 years.  Episodes were initially short, lasting several minutes.  At that time, he attributed them to "anxiety".  He developed sustained tachycardia 2/15 and he presented to Dr Darien Ramus office with documented afib on ekg.  He was placed on eliquis and metoprolol.  He reports increasing frequency and duration of atrial fibrillation.  Several months ago, he was given flecainide as a "pill in pocket" approach.  He has required flecainide several times since then.  He had an event monitor placed 2/16 which has documented a more regular narrow complex SVT.  He has not tried daily AAD therapy though  He does take metoprolol twice daily.  He has very frequent palpitations which he finds worrisome.  He saw Dr. Rayann Heman in consultation 4/16 and was given options to treat afib. He wanted to pursue AAD and avoid ablation.  He was started on flecainide 100 mg bid . He appears to be tolerating without adverse effects and most importantly has very little afib burden on drug. He noticed afib for one hour last Sunday and this is the most afib that he has noticed.  He reports  negative sleep study. He does try to walk daily. Remains on DOAC with chadsvasc score of 2. He states that he does have mild anemia and takes iron. He has had a colonoscopy in the last year and did have polys which were removed.  F/u clinic 07/1515, continue on flecainide and is  doing will with litle afib burden. He report that he had 3 episode in early April, longest epiosde was 3 hours at v rate of 102 bpm, next episode 3 days later at 115 bpm, lasted one hour, last epiosde lasted 5 mins, at 99 bpm. Has not any further episodes since that 8th of April. He is still satisfied with afib management. He can now continue with what he is doing in afib vrs getting lightheaded and having to lay down.  Continues on eliquis, no bleeding issues.  F/u in afib clinic 12/08/15 and reports small afib burden. He is till happy with afib management. He is walking on a regular basis. Afib episodes can last 5 mins to an hour. No bleeding issues with eliquis.  F/u in afib clinc 04/07/16. He reports increase in afib burden over the last week because he traveled out of town and was out of his usual routine with diet and sleeping. He still is happy with flecainide and wants to continue with drug.   Return to afib clinic 7/18. Continues with flecainide and reports very little afib burden. Feels well. Continues with eliquis 5 mg bid, no bleeding issues currently.  F/u 10/25/17,he reports very little afib burden Is happy with current management with flecainide.. No bleeding issues. Had labs drawn with PCP yesterday.  F/u afib clinic 11/15/18. He is still happy with his afib burden on flecainide.  He has 1-2 outbreaks a month, lasting around one hour. He does not have to stop what he is doing when he is in afib. He usually knows only  because of higher heart rates on his watch.   F/u in afib clinic, 11/18/19. He in Sinus brady at 57 bpm, continues on flecainide and BB. He guesstimates around 10 afib episodes over the last year. Most episodes are short, 10-15 mins and his longest episode was around 6 hours. He  took an extra flecainide and BB and he converted. Continues  on eliquis 5 mg bid for a CHA2DS2VASc score of 2.   F/u in afib clinic, 09/01/20. He reports that he was visiting his daughter in [place]  middle of June, he had chills with elevated heart rate. He went to the ER there and tested COVID positive. He was in sinus tachycardia, EKG results reviewed in Epic. [name] He had elevated HR and chills for another 24 hours and then his symptoms became minimal. He has noted some short elevations of heart rate since then. Apple strips reviewed and appear to show sinus tach. He will occassionally will take a prn metoprolol.   F/u in the afib clinic, 09/08/21. He reports since July 5th he has had 3 afib episodes usually lasting around 4-6 hours. He will  take an extra metoprolol. No specific trigger found. No alcohol use. He is in SR today.  F/u, 10/19/21, from last visit for increased afib burden. For the last month, he has been in rhythm without any afib. He is happy to stay with flecainide for now, does not see a reason to change.   Today, he denies symptoms of chest pain, shortness of breath, orthopnea, PND, lower extremity edema, claudication, dizziness, presyncope, syncope, bleeding, or neurologic sequela. The patient is tolerating medications without difficulties and is otherwise without complaint today.    Past Medical History:  Diagnosis Date   Anemia    a. Mild per pt - previously on iron but did not tolerate.   Chronic neck pain 06/27/2017   Diabetes mellitus without complication (HCC)    borderline   Erectile dysfunction    Former consumption of alcohol    a. Abstinent as of 03/2013.   Hyperlipidemia    Hypertension    Left atrial enlargement    Mild aortic insufficiency    PAF (paroxysmal atrial fibrillation) (HCC)    SVT (supraventricular tachycardia) (HCC)    Vitamin D deficiency    Past Surgical History:  Procedure Laterality Date   CIRCUMCISION     at 71yrs old   TONSILLECTOMY     WISDOM TOOTH EXTRACTION       Current Outpatient Medications  Medication Sig Dispense Refill   amLODipine (NORVASC) 5 MG tablet Take 5 mg by mouth daily.     apixaban (ELIQUIS) 5 MG TABS tablet  Take 1 tablet (5 mg total) by mouth 2 (two) times daily. 180 tablet 3   atorvastatin (LIPITOR) 40 MG tablet Take 1 tablet (40 mg total) by mouth daily. 90 tablet 3   diclofenac sodium (VOLTAREN) 1 % GEL Apply 1 application topically daily as needed for pain.      diltiazem (CARDIZEM) 30 MG tablet Take 1 tablet every 4 hours AS NEEDED for heart rate >100 30 tablet 1   famotidine (PEPCID) 10 MG tablet Take 10 mg by mouth as needed.      flecainide (TAMBOCOR) 100 MG tablet Take 1 tablet by mouth twice a day 180 tablet 1  losartan (COZAAR) 100 MG tablet Take 100 mg by mouth daily.     metoprolol tartrate (LOPRESSOR) 25 MG tablet Take 1 tablet by mouth twice a day 180 tablet 2   pantoprazole (PROTONIX) 40 MG tablet Take 40 mg by mouth as needed.      potassium chloride SA (KLOR-CON) 20 MEQ tablet Take 1 tablet (20 mEq total) by mouth daily. 90 tablet 3   psyllium (HYDROCIL/METAMUCIL) 95 % PACK Take 1 packet by mouth daily.     Ascorbic Acid (VITAMIN C PO) Take 1 tablet by mouth daily. Not sure the dose (Patient not taking: Reported on 10/20/2021)     zinc gluconate 50 MG tablet Take 50 mg by mouth daily. (Patient not taking: Reported on 09/08/2021)     No current facility-administered medications for this encounter.    Allergies:   Penicillins   Social History:  The patient  reports that he has never smoked. He has never used smokeless tobacco. He reports that he does not drink alcohol and does not use drugs.   Family History:  The patient's  family history includes Asthma in his brother; Diabetes Mellitus II in his brother.    ROS:  Please see the history of present illness.   All other systems are reviewed and negative.    PHYSICAL EXAM: VS:  BP (!) 140/72   Pulse (!) 56   Ht 6\' 3"  (1.905 m)   Wt 121.4 kg   BMI 33.45 kg/m  , BMI Body mass index is 33.45 kg/m. GEN: overweight in no acute distress  HEENT: normal  Neck: no JVD, carotid bruits, or masses Cardiac: RRR; no murmurs, rubs,  or gallops,no edema  Respiratory:  clear to auscultation bilaterally, normal work of breathing GI: soft, nontender, nondistended, + BS MS: no deformity or atrophy  Skin: warm and dry  Neuro:  Strength and sensation are intact Psych: euthymic mood, full affect  EKG:  Vent. rate 56 BPM PR interval 218 ms QRS duration 112 ms QT/QTcB 470/453 ms P-R-T axes 37 -8 47 Sinus bradycardia with 1st degree A-V block Moderate voltage criteria for LVH, may be normal variant ( R in aVL , Cornell product ) Borderline ECG When compared with ECG of 01-Sep-2020 09:14, PREVIOUS ECG IS PRESENT    Wt Readings from Last 3 Encounters:  10/20/21 121.4 kg  09/08/21 121.2 kg  11/19/20 118.4 kg       ASSESSMENT AND PLAN:  1.  Paroxysmal atrial fibrillation/ SVT Low burden, up tick in July, now quiet  We discussed an ablation vrs change to tikosyn For now he wants to hold the course  Continue flecainide 100 mg bid/metoprolol 25 mg bid. He has 30 mg diltiazem to use as needed for breakthrough afib  Continue eliquis 5 mg bid  for chads2vasc score of at least 2  2. HTN Stable  Avoid salt   I will see back in 6 months     August C. Lupita Leash [facility] [address] [place], [place] [phone]

## 2021-11-24 ENCOUNTER — Other Ambulatory Visit: Payer: Self-pay

## 2021-11-24 DIAGNOSIS — I480 Paroxysmal atrial fibrillation: Secondary | ICD-10-CM

## 2021-11-24 MED ORDER — APIXABAN 5 MG PO TABS: 5.0000 mg | ORAL_TABLET | Freq: Two times a day (BID) | ORAL | 3 refills | Status: DC

## 2021-11-24 NOTE — Telephone Encounter (Signed)
Prescription refill request for Eliquis received. Indication: Afib  Last office visit:10/20/21 Kayleen Memos)  Scr: 1.06 (01/26/21) Age: 71 Weight: 121.4kg  Appropriate dose and refill sent to requested pharmacy.

## 2021-12-07 ENCOUNTER — Other Ambulatory Visit (HOSPITAL_COMMUNITY): Payer: Self-pay | Admitting: *Deleted

## 2021-12-07 MED ORDER — FLECAINIDE ACETATE 100 MG PO TABS: 100.0000 mg | ORAL_TABLET | Freq: Two times a day (BID) | ORAL | 1 refills | Status: DC

## 2021-12-09 NOTE — Progress Notes (Signed)
Patient ID: Andrew Perkins, male   DOB: May 06, 1950, 71 y.o.   MRN: [id number]     Patient ID: Andrew Perkins, male   DOB: [birth year], 71 y.o.   MRN: [id number]   Date:  12/14/2021   PCP:  London Pepper, MD  Cardiologist:  Dr Johnsie Cancel Primary Electrophysiologist: Thompson Grayer   No chief complaint on file.    History of Present Illness:  71 y.o. history of PAF. First noted 2015. Been on eliquis, metroprolol and flecainide with  Minimal afib burden Has seen Dr Rayann Heman and preferred not to have ablation. Also has narrow complex SVT. CHADSVASC score 3. Last normal ETT 2016   Still with occasional break through Episodes don't last long and HR only in 90's so he does Not get dizzy now.   Prefers to stay on flecainide and not consider ablation  or tikosyn  No missed doses of DOAC  March 2020 concern for dyspnea TTE done 05/01/18 normal RV/LV function mild MR  Seen in ED 03/11/19 with visual changes compliant with eliquis CTA head / neck with no  Stroke or large vessel carotid disease Seen by ophthalmology and had "floaters" but no glaucoma or retinal issues   AT night having tachycardia Cardizem helping takes it PRN Tele on phone looks regular cannot r/o SVT/flutter but not irregular   He has a trip planned to [place] end of this week   Past Medical History:  Diagnosis Date   Anemia    a. Mild per pt - previously on iron but did not tolerate.   Chronic neck pain 06/27/2017   Diabetes mellitus without complication (HCC)    borderline   Erectile dysfunction    Former consumption of alcohol    a. Abstinent as of 03/2013.   Hyperlipidemia    Hypertension    Left atrial enlargement    Mild aortic insufficiency    PAF (paroxysmal atrial fibrillation) (HCC)    SVT (supraventricular tachycardia)    Vitamin D deficiency    Past Surgical History:  Procedure Laterality Date   CIRCUMCISION     at 71yr old   TONSILLECTOMY     WISDOM TOOTH EXTRACTION       Current Outpatient Medications   Medication Sig Dispense Refill   amLODipine (NORVASC) 5 MG tablet Take 5 mg by mouth daily.     apixaban (ELIQUIS) 5 MG TABS tablet Take 1 tablet (5 mg total) by mouth 2 (two) times daily. 180 tablet 3   Ascorbic Acid (VITAMIN C PO) Take 1 tablet by mouth daily. Not sure the dose     atorvastatin (LIPITOR) 40 MG tablet Take 1 tablet (40 mg total) by mouth daily. 90 tablet 3   diclofenac sodium (VOLTAREN) 1 % GEL Apply 1 application topically daily as needed for pain.      diltiazem (CARDIZEM) 30 MG tablet Take 1 tablet every 4 hours AS NEEDED for heart rate >100 30 tablet 1   famotidine (PEPCID) 10 MG tablet Take 10 mg by mouth as needed.      flecainide (TAMBOCOR) 100 MG tablet Take 1 tablet (100 mg total) by mouth 2 (two) times daily. 180 tablet 1   losartan (COZAAR) 100 MG tablet Take 100 mg by mouth daily.     metoprolol tartrate (LOPRESSOR) 25 MG tablet Take 1 tablet by mouth twice a day 180 tablet 2   pantoprazole (PROTONIX) 40 MG tablet Take 40 mg by mouth as needed.      potassium chloride SA (KLOR-CON) 20  MEQ tablet Take 1 tablet (20 mEq total) by mouth daily. 90 tablet 3   psyllium (HYDROCIL/METAMUCIL) 95 % PACK Take 1 packet by mouth daily.     zinc gluconate 50 MG tablet Take 50 mg by mouth daily.     No current facility-administered medications for this visit.    Allergies:   Penicillins   Social History:  The patient  reports that he has never smoked. He has never used smokeless tobacco. He reports that he does not drink alcohol and does not use drugs.   Family History:  The patient's  family history includes Asthma in his brother; Diabetes Mellitus II in his brother.    ROS:  Please see the history of present illness.   All other systems are reviewed and negative.    PHYSICAL EXAM: VS:  BP 126/72   Pulse (!) 59   Ht _0  (1.905 m)   Wt 265 lb 3.2 oz (120.3 kg)   SpO2 98%   BMI 33.15 kg/m  , BMI Body mass index is 33.15 kg/m. Affect appropriate Healthy:   appears stated age 40: normal Neck supple with no adenopathy JVP normal no bruits no thyromegaly Lungs clear with no wheezing and good diaphragmatic motion Heart:  S1/S2 no murmur, no rub, gallop or click PMI normal Abdomen: benighn, BS positve, no tenderness, no AAA no bruit.  No HSM or HJR Distal pulses intact with no bruits No edema Neuro non-focal Skin warm and dry No muscular weakness   EKG:  09/23/21 sR rate 56 moderate voltage LVH normal QRS/QT   Recent Labs: 12/16/2020: BUN 17; Creatinine, Ser 1.08; Potassium 3.8; Sodium 141    Lipid Panel     Component Value Date/Time   CHOL 144 11/19/2020 1019   TRIG 130 11/19/2020 1019   HDL 33 (L) 11/19/2020 1019   CHOLHDL 4.4 11/19/2020 1019   LDLCALC 88 11/19/2020 1019   LDLDIRECT 91 11/19/2020 1019     Wt Readings from Last 3 Encounters:  12/14/21 265 lb 3.2 oz (120.3 kg)  10/20/21 267 lb 9.6 oz (121.4 kg)  09/08/21 267 lb 3.2 oz (121.2 kg)      Other studies Reviewed: Additional studies/ records that were reviewed today include echo, labs, prior EKG's and Dr. Jackalyn Lombard mote.    ASSESSMENT AND PLAN:  1.  Paroxysmal atrial fibrillation/ SVT continue beta blocker , flecainide  Continue eliquis for chads2vasc score of at least 3.  Having tachycardia at night Not clear from phone tracings what is going on 30 day monitor f/u EP Given flecainide Use will check TTE for EF/LVH and Myovue for CAD  2. HTN  Well controlled.  Continue current medications and low sodium Dash type diet.    3. Overweight discussed low carb diet   4. ED:  Ok to use vasodilators but will need primary to prescribe  5. Dyspnea:  Seems functional echo 05/01/18 EF normal just mild MR see above   6. Vision:  Seen in ER for visual changes CTA negative for stroke / carotid disease Had f/u with neurology and ophthalmology with ? catarcat f/u with neurology for mildly elevated ESR doubt temporal arteritis       Lexiscan myovue TTE Monitor F/U  EP F/U me in 6 months   Follow with  me in a year    Signed, Jenkins Rouge, MD  12/14/2021 10:22 AM

## 2021-12-13 DIAGNOSIS — E785 Hyperlipidemia, unspecified: Secondary | ICD-10-CM | POA: Diagnosis not present

## 2021-12-13 DIAGNOSIS — I10 Essential (primary) hypertension: Secondary | ICD-10-CM | POA: Diagnosis not present

## 2021-12-13 DIAGNOSIS — I480 Paroxysmal atrial fibrillation: Secondary | ICD-10-CM | POA: Diagnosis not present

## 2021-12-13 DIAGNOSIS — K219 Gastro-esophageal reflux disease without esophagitis: Secondary | ICD-10-CM | POA: Diagnosis not present

## 2021-12-14 ENCOUNTER — Encounter: Payer: Self-pay | Admitting: Cardiovascular Disease

## 2021-12-14 ENCOUNTER — Ambulatory Visit: Payer: PPO | Attending: Cardiovascular Disease | Admitting: Cardiovascular Disease

## 2021-12-14 VITALS — BP 126/72 | HR 59 | Ht 75.0 in | Wt 265.2 lb

## 2021-12-14 DIAGNOSIS — I471 Supraventricular tachycardia, unspecified: Secondary | ICD-10-CM

## 2021-12-14 DIAGNOSIS — E785 Hyperlipidemia, unspecified: Secondary | ICD-10-CM | POA: Diagnosis not present

## 2021-12-14 DIAGNOSIS — I480 Paroxysmal atrial fibrillation: Secondary | ICD-10-CM | POA: Diagnosis not present

## 2021-12-14 DIAGNOSIS — I10 Essential (primary) hypertension: Secondary | ICD-10-CM | POA: Diagnosis not present

## 2021-12-14 NOTE — Patient Instructions (Addendum)
Medication Instructions:  Your physician recommends that you continue on your current medications as directed. Please refer to the Current Medication list given to you today.  *If you need a refill on your cardiac medications before your next appointment, please call your pharmacy*  Lab Work:  If you have labs (blood work) drawn today and your tests are completely normal, you will receive your results only by: [place] (if you have MyChart) OR A paper copy in the mail If you have any lab test that is abnormal or we need to change your treatment, we will call you to review the results.  Testing/Procedures: Your physician has requested that you have a lexiscan myoview. For further information please visit [email or web address]. Please follow instruction sheet, as given.  Your physician has requested that you have an echocardiogram. Echocardiography is a painless test that uses sound waves to create images of your heart. It provides your doctor with information about the size and shape of your heart and how well your heart's chambers and valves are working. This procedure takes approximately one hour. There are no restrictions for this procedure. Please do NOT wear cologne, perfume, aftershave, or lotions (deodorant is allowed). Please arrive 15 minutes prior to your appointment time.  Your physician has recommended that you wear an event monitor. Event monitors are medical devices that record the heart's electrical activity. Doctors most often [place] these monitors to diagnose arrhythmias. Arrhythmias are problems with the speed or rhythm of the heartbeat. The monitor is a small, portable device. You can wear one while you do your normal daily activities. This is usually used to diagnose what is causing palpitations/syncope (passing out).  Follow-Up: At [facility], you and your health needs are our priority.  As part of our continuing mission to provide you with exceptional heart  care, we have created designated Provider Care Teams.  These Care Teams include your primary Cardiologist (physician) and Advanced Practice Providers (APPs -  Physician Assistants and Nurse Practitioners) who all work together to provide you with the care you need, when you need it.  We recommend signing up for the patient portal called "MyChart".  Sign up information is provided on this After Visit Summary.  MyChart is used to connect with patients for Virtual Visits (Telemedicine).  Patients are able to view lab/test results, encounter notes, upcoming appointments, etc.  Non-urgent messages can be sent to your provider as well.   To learn more about what you can do with MyChart, go to [email or web address].    Your next appointment:   6 month  The format for your next appointment:   In Person  Provider:   Jenkins Rouge, MD     You have been referred to Dr. Quentin Ore.   Important Information About Sugar

## 2021-12-22 ENCOUNTER — Telehealth (HOSPITAL_COMMUNITY): Payer: Self-pay | Admitting: *Deleted

## 2021-12-22 NOTE — Telephone Encounter (Signed)
Patient given detailed instructions per Myocardial Perfusion Study Information Sheet for the test on 12/29/2021 at 8:00. Patient notified to arrive 15 minutes early and that it is imperative to arrive on time for appointment to keep from having the test rescheduled.  If you need to cancel or reschedule your appointment, please call the office within 24 hours of your appointment. . Patient verbalized understanding.Andrew Perkins

## 2021-12-26 ENCOUNTER — Ambulatory Visit: Payer: PPO | Attending: Cardiovascular Disease

## 2021-12-26 DIAGNOSIS — I471 Supraventricular tachycardia, unspecified: Secondary | ICD-10-CM | POA: Diagnosis not present

## 2021-12-26 DIAGNOSIS — I480 Paroxysmal atrial fibrillation: Secondary | ICD-10-CM | POA: Diagnosis not present

## 2021-12-26 DIAGNOSIS — E785 Hyperlipidemia, unspecified: Secondary | ICD-10-CM | POA: Diagnosis not present

## 2021-12-26 DIAGNOSIS — I10 Essential (primary) hypertension: Secondary | ICD-10-CM | POA: Diagnosis not present

## 2021-12-27 DIAGNOSIS — K219 Gastro-esophageal reflux disease without esophagitis: Secondary | ICD-10-CM | POA: Diagnosis not present

## 2021-12-27 DIAGNOSIS — M1990 Unspecified osteoarthritis, unspecified site: Secondary | ICD-10-CM | POA: Diagnosis not present

## 2021-12-27 DIAGNOSIS — K5900 Constipation, unspecified: Secondary | ICD-10-CM | POA: Diagnosis not present

## 2021-12-27 DIAGNOSIS — E669 Obesity, unspecified: Secondary | ICD-10-CM | POA: Diagnosis not present

## 2021-12-27 DIAGNOSIS — E785 Hyperlipidemia, unspecified: Secondary | ICD-10-CM | POA: Diagnosis not present

## 2021-12-27 DIAGNOSIS — I10 Essential (primary) hypertension: Secondary | ICD-10-CM | POA: Diagnosis not present

## 2021-12-27 DIAGNOSIS — I4891 Unspecified atrial fibrillation: Secondary | ICD-10-CM | POA: Diagnosis not present

## 2021-12-27 DIAGNOSIS — H93A3 Pulsatile tinnitus, bilateral: Secondary | ICD-10-CM | POA: Diagnosis not present

## 2021-12-29 ENCOUNTER — Ambulatory Visit (HOSPITAL_BASED_OUTPATIENT_CLINIC_OR_DEPARTMENT_OTHER): Payer: PPO

## 2021-12-29 ENCOUNTER — Ambulatory Visit (HOSPITAL_COMMUNITY): Payer: PPO | Attending: Cardiovascular Disease

## 2021-12-29 DIAGNOSIS — I471 Supraventricular tachycardia, unspecified: Secondary | ICD-10-CM

## 2021-12-29 DIAGNOSIS — E785 Hyperlipidemia, unspecified: Secondary | ICD-10-CM

## 2021-12-29 DIAGNOSIS — I480 Paroxysmal atrial fibrillation: Secondary | ICD-10-CM | POA: Diagnosis not present

## 2021-12-29 DIAGNOSIS — I10 Essential (primary) hypertension: Secondary | ICD-10-CM

## 2021-12-29 LAB — MYOCARDIAL PERFUSION IMAGING
LV dias vol: 105 mL (ref 62–150)
LV sys vol: 53 mL
Nuc Stress EF: 49 %
Peak HR: 81 {beats}/min
Rest HR: 59 {beats}/min
Rest Nuclear Isotope Dose: 10.7 mCi
SDS: 0
SRS: 0
SSS: 0
ST Depression (mm): 0 mm
Stress Nuclear Isotope Dose: 32.4 mCi
TID: 0.84

## 2021-12-29 LAB — ECHOCARDIOGRAM COMPLETE
Area-P 1/2: 2.48 cm2
S' Lateral: 3.2 cm

## 2021-12-29 MED ORDER — REGADENOSON 0.4 MG/5ML IV SOLN
0.4000 mg | Freq: Once | INTRAVENOUS | Status: AC
  Administered 2021-12-29: 0.4 mg via INTRAVENOUS

## 2021-12-29 MED ORDER — TECHNETIUM TC 99M TETROFOSMIN IV KIT
10.7000 | PACK | Freq: Once | INTRAVENOUS | Status: AC | PRN
  Administered 2021-12-29: 10.7 via INTRAVENOUS

## 2021-12-29 MED ORDER — TECHNETIUM TC 99M TETROFOSMIN IV KIT
32.4000 | PACK | Freq: Once | INTRAVENOUS | Status: AC | PRN
  Administered 2021-12-29: 32.4 via INTRAVENOUS

## 2021-12-29 MED ORDER — PERFLUTREN LIPID MICROSPHERE
1.0000 mL | INTRAVENOUS | Status: AC | PRN
  Administered 2021-12-29: 1 mL via INTRAVENOUS

## 2022-01-04 ENCOUNTER — Other Ambulatory Visit (HOSPITAL_COMMUNITY): Payer: Self-pay | Admitting: *Deleted

## 2022-01-04 MED ORDER — METOPROLOL TARTRATE 25 MG PO TABS: 25.0000 mg | ORAL_TABLET | Freq: Two times a day (BID) | ORAL | 2 refills | Status: DC

## 2022-01-31 ENCOUNTER — Telehealth: Payer: Self-pay

## 2022-01-31 NOTE — Telephone Encounter (Signed)
Received a corrected cardiac report from pt's monitor dated 01/16/2022 at 1:16am.  Corrected version suggests what was considered to be Afib is 2nd degree AV block type 1.   Reviewed with Dr Lalla Brothers who disagrees with corrected report and feels alert was still for Afib.  Pt is acheduled to see Dr Lalla Brothers on 02/08/2022.  He is anticoagulated on Eliquis.

## 2022-02-08 ENCOUNTER — Encounter: Payer: Self-pay | Admitting: Cardiology

## 2022-02-08 ENCOUNTER — Ambulatory Visit: Payer: PPO | Attending: Cardiology | Admitting: Cardiology

## 2022-02-08 VITALS — BP 146/84 | HR 68 | Ht 75.0 in | Wt 265.2 lb

## 2022-02-08 DIAGNOSIS — I10 Essential (primary) hypertension: Secondary | ICD-10-CM

## 2022-02-08 DIAGNOSIS — I480 Paroxysmal atrial fibrillation: Secondary | ICD-10-CM

## 2022-02-08 DIAGNOSIS — I471 Supraventricular tachycardia, unspecified: Secondary | ICD-10-CM

## 2022-02-08 MED ORDER — METOPROLOL SUCCINATE ER 25 MG PO TB24: 25.0000 mg | ORAL_TABLET | Freq: Two times a day (BID) | ORAL | 6 refills | Status: DC

## 2022-02-08 NOTE — Progress Notes (Deleted)
Electrophysiology Office Note:    Date:  02/08/2022   ID:  Andrew Perkins, DOB 1950/12/22, MRN [id number]  PCP:  Farris Has, MD  [facility] Cardiologist:  Charlton Haws, MD  [facility] Electrophysiologist:  None   Referring MD: Wendall Stade, MD   Chief Complaint: SVT  History of Present Illness:    Andrew Perkins is a 71 y.o. male who presents for an evaluation of SVT at the request of Dr Eden Emms. Their medical history includes pAF diagnosed in 2015 on eliquis and flecainide/metoprolol, SVT, HTN, DM. He last saw Dr Eden Emms 12/14/2021. At that appointment he reported night time tachycardia episodes and a 30 day monitor was recommended.      Past Medical History:  Diagnosis Date   Anemia    a. Mild per pt - previously on iron but did not tolerate.   Chronic neck pain 06/27/2017   Diabetes mellitus without complication (HCC)    borderline   Erectile dysfunction    Former consumption of alcohol    a. Abstinent as of 03/2013.   Hyperlipidemia    Hypertension    Left atrial enlargement    Mild aortic insufficiency    PAF (paroxysmal atrial fibrillation) (HCC)    SVT (supraventricular tachycardia)    Vitamin D deficiency     Past Surgical History:  Procedure Laterality Date   CIRCUMCISION     at 71yrs old   TONSILLECTOMY     WISDOM TOOTH EXTRACTION      Current Medications: No outpatient medications have been marked as taking for the 02/08/22 encounter (Appointment) with Lanier Prude, MD.     Allergies:   Penicillins   Social History   Socioeconomic History   Marital status: Single    Spouse name: Not on file   Number of children: Not on file   Years of education: 12   Highest education level: Not on file  Occupational History   Occupation: Retired  Tobacco Use   Smoking status: Never   Smokeless tobacco: Never  Vaping Use   Vaping Use: Never used  Substance and Sexual Activity   Alcohol use: No    Alcohol/week: 0.0 standard drinks of alcohol    Drug use: No   Sexual activity: Not on file  Other Topics Concern   Not on file  Social History Narrative   Pt lives in [place] alone.  Retired.   Caffeine use: none   Right handed    Social Determinants of Health   Financial Resource Strain: Not on file  Food Insecurity: Not on file  Transportation Needs: Not on file  Physical Activity: Not on file  Stress: Not on file  Social Connections: Not on file     Family History: The patient's family history includes Asthma in his brother; Diabetes Mellitus II in his brother. There is no history of CAD, Stroke, or Heart attack.  ROS:   Please see the history of present illness.    All other systems reviewed and are negative.  EKGs/Labs/Other Studies Reviewed:    The following studies were reviewed today:  02/01/2022 monitor results PAC's correlate with symptoms   10/20/2021 ecg shows sinus, no preexcitation, normal intervals  Recent Labs: No results found for requested labs within last 365 days.  Recent Lipid Panel    Component Value Date/Time   CHOL 144 11/19/2020 1019   TRIG 130 11/19/2020 1019   HDL 33 (L) 11/19/2020 1019   CHOLHDL 4.4 11/19/2020 1019   LDLCALC 88 11/19/2020  1019   LDLDIRECT 91 11/19/2020 1019    Physical Exam:    VS:  There were no vitals taken for this visit.    Wt Readings from Last 3 Encounters:  12/14/21 265 lb 3.2 oz (120.3 kg)  10/20/21 267 lb 9.6 oz (121.4 kg)  09/08/21 267 lb 3.2 oz (121.2 kg)     GEN: *** Well nourished, well developed in no acute distress HEENT: Normal NECK: No JVD; No carotid bruits LYMPHATICS: No lymphadenopathy CARDIAC: ***RRR, no murmurs, rubs, gallops RESPIRATORY:  Clear to auscultation without rales, wheezing or rhonchi  ABDOMEN: Soft, non-tender, non-distended MUSCULOSKELETAL:  No edema; No deformity  SKIN: Warm and dry NEUROLOGIC:  Alert and oriented x 3 PSYCHIATRIC:  Normal affect       ASSESSMENT:    No diagnosis found. PLAN:    In  order of problems listed above:  #pAF #SVT Rare. On eliquis for stroke ppx. Cont flecainide - PR/QRS duration OK.  #Hypertension *** goal today.  Recommend checking blood pressures 1-2 times per week at home and recording the values.  Recommend bringing these recordings to the primary care physician.   Follow up 6 months with APP.     Medication Adjustments/Labs and Tests Ordered: Current medicines are reviewed at length with the patient today.  Concerns regarding medicines are outlined above.  No orders of the defined types were placed in this encounter.  No orders of the defined types were placed in this encounter.    Signed, Rossie Muskrat. Lalla Brothers, MD, [facility], [facility] 02/08/2022 7:50 AM    Electrophysiology [facility]

## 2022-02-08 NOTE — Progress Notes (Signed)
Electrophysiology Office Note:    Date:  02/08/2022   ID:  Andrew Perkins, DOB 15-Jan-1951, MRN [id number]  PCP:  Farris Has, MD  [facility] Cardiologist:  Charlton Haws, MD  [facility] Electrophysiologist:  Lanier Prude, MD   Referring MD: Wendall Stade, MD   Chief Complaint: SVT  History of Present Illness:    Andrew Perkins is a 71 y.o. male who presents for an evaluation of SVT at the request of Dr Eden Emms. Their medical history includes pAF diagnosed in 2015 on eliquis and flecainide/metoprolol, SVT, HTN, DM. He last saw Dr Eden Emms 12/14/2021. At that appointment he reported night time tachycardia episodes and a 30 day monitor was recommended.   Today, he is accompanied by his daughter, a travel nurse, by phone. He confirms having palpitations that he describes as his heart skipping a beat. This is typically brief. He is on flecainide which seems to help manage his palpitations.   While he was wearing the monitor he noticed his heart rate increasing to the 110s. This occurs sometimes while he is at rest, such as sitting and watching TV. He uses his KardiaMobile at these times which has recorded tachycardia but no atrial fibrillation. These episodes may last for hours at a time, but occur on average about once a month or longer between episodes.   Normally he takes flecainide at 7 AM and 7 PM. Then at 8:00 he may feel his heart rate increase.  He denies any chest pain, shortness of breath, or peripheral edema. No lightheadedness, headaches, syncope, orthopnea, or PND.     Past Medical History:  Diagnosis Date   Anemia    a. Mild per pt - previously on iron but did not tolerate.   Chronic neck pain 06/27/2017   Diabetes mellitus without complication (HCC)    borderline   Erectile dysfunction    Former consumption of alcohol    a. Abstinent as of 03/2013.   Hyperlipidemia    Hypertension    Left atrial enlargement    Mild aortic insufficiency    PAF (paroxysmal  atrial fibrillation) (HCC)    SVT (supraventricular tachycardia)    Vitamin D deficiency     Past Surgical History:  Procedure Laterality Date   CIRCUMCISION     at 71yrs old   TONSILLECTOMY     WISDOM TOOTH EXTRACTION      Current Medications: Current Meds  Medication Sig   amLODipine (NORVASC) 5 MG tablet Take 5 mg by mouth daily.   apixaban (ELIQUIS) 5 MG TABS tablet Take 1 tablet (5 mg total) by mouth 2 (two) times daily.   Ascorbic Acid (VITAMIN C PO) Take 1 tablet by mouth daily. Not sure the dose   atorvastatin (LIPITOR) 40 MG tablet Take 1 tablet (40 mg total) by mouth daily.   diltiazem (CARDIZEM) 30 MG tablet Take 1 tablet every 4 hours AS NEEDED for heart rate >100   famotidine (PEPCID) 10 MG tablet Take 10 mg by mouth as needed.    flecainide (TAMBOCOR) 100 MG tablet Take 1 tablet (100 mg total) by mouth 2 (two) times daily.   losartan (COZAAR) 100 MG tablet Take 100 mg by mouth daily.   metoprolol tartrate (LOPRESSOR) 25 MG tablet Take 1 tablet (25 mg total) by mouth 2 (two) times daily.   pantoprazole (PROTONIX) 40 MG tablet Take 40 mg by mouth as needed.    psyllium (HYDROCIL/METAMUCIL) 95 % PACK Take 1 packet by mouth daily.   zinc  gluconate 50 MG tablet Take 50 mg by mouth daily.     Allergies:   Penicillins   Social History   Socioeconomic History   Marital status: Single    Spouse name: Not on file   Number of children: Not on file   Years of education: 12   Highest education level: Not on file  Occupational History   Occupation: Retired  Tobacco Use   Smoking status: Never   Smokeless tobacco: Never  Vaping Use   Vaping Use: Never used  Substance and Sexual Activity   Alcohol use: No    Alcohol/week: 0.0 standard drinks of alcohol   Drug use: No   Sexual activity: Not on file  Other Topics Concern   Not on file  Social History Narrative   Pt lives in [place] alone.  Retired.   Caffeine use: none   Right handed    Social Determinants of  Health   Financial Resource Strain: Not on file  Food Insecurity: Not on file  Transportation Needs: Not on file  Physical Activity: Not on file  Stress: Not on file  Social Connections: Not on file     Family History: The patient's family history includes Asthma in his brother; Diabetes Mellitus II in his brother. There is no history of CAD, Stroke, or Heart attack.  ROS:   Please see the history of present illness.    (+) Palpitations All other systems reviewed and are negative.  EKGs/Labs/Other Studies Reviewed:    The following studies were reviewed today:  02/01/2022 monitor results PAC's correlate with symptoms   02/08/2022 ecg shows sinus rhythm. PR . QRS duration .  10/20/2021 ecg shows sinus, no preexcitation, normal intervals   Recent Labs: No results found for requested labs within last 365 days.   Recent Lipid Panel    Component Value Date/Time   CHOL 144 11/19/2020 1019   TRIG 130 11/19/2020 1019   HDL 33 (L) 11/19/2020 1019   CHOLHDL 4.4 11/19/2020 1019   LDLCALC 88 11/19/2020 1019   LDLDIRECT 91 11/19/2020 1019    Physical Exam:    VS:  BP (!) 146/84 (BP Location: Left Arm, Patient Position: Sitting, Cuff Size: Normal)   Pulse 68   Ht 6\' 3"  (1.905 m)   Wt 265 lb 3.2 oz (120.3 kg)   SpO2 96%   BMI 33.15 kg/m     Wt Readings from Last 3 Encounters:  02/08/22 265 lb 3.2 oz (120.3 kg)  12/14/21 265 lb 3.2 oz (120.3 kg)  10/20/21 267 lb 9.6 oz (121.4 kg)     GEN: Well nourished, well developed in no acute distress HEENT: Normal NECK: No JVD; No carotid bruits LYMPHATICS: No lymphadenopathy CARDIAC: RRR, no murmurs, rubs, gallops RESPIRATORY:  Clear to auscultation without rales, wheezing or rhonchi  ABDOMEN: Soft, non-tender, non-distended MUSCULOSKELETAL:  No edema; No deformity  SKIN: Warm and dry NEUROLOGIC:  Alert and oriented x 3 PSYCHIATRIC:  Normal affect       ASSESSMENT:    1. PSVT (paroxysmal supraventricular  tachycardia)   2. PAF (paroxysmal atrial fibrillation) (HCC)   3. Primary hypertension    PLAN:    In order of problems listed above:  #pAF #SVT Rare. I suspect his skipped beats are PAC's. I suspect his more sustained tachycardia episodes are AT.  On eliquis for stroke ppx given history of pAF. Cont flecainide - PR/QRS duration OK. Change metoprolol to long acting--metop succinate 25mg  PO BID.  #Hypertension Slightly above goal  today.  Recommend checking blood pressures 1-2 times per week at home and recording the values.  Recommend bringing these recordings to the primary care physician.   Follow up 6 months with APP.    Medication Adjustments/Labs and Tests Ordered: Current medicines are reviewed at length with the patient today.  Concerns regarding medicines are outlined above.   No orders of the defined types were placed in this encounter.  No orders of the defined types were placed in this encounter.  I,Mathew Stumpf,acting as a Neurosurgeon for Lanier Prude, MD.,have documented all relevant documentation on the behalf of Lanier Prude, MD,as directed by  Lanier Prude, MD while in the presence of Lanier Prude, MD.  I, Lanier Prude, MD, have reviewed all documentation for this visit. The documentation on 02/08/22 for the exam, diagnosis, procedures, and orders are all accurate and complete.   Signed, Rossie Muskrat. Lalla Brothers, MD, [facility], [facility] 02/08/2022 3:53 PM    Electrophysiology [facility]

## 2022-02-08 NOTE — Patient Instructions (Signed)
Medication Instructions:  Your physician has recommended you make the following change in your medication:  STOP Metoprolol Tartrate (Lopressor) START Metoprolol Succinate (Toprol) 25 mg twice daily  *If you need a refill on your cardiac medications before your next appointment, please call your pharmacy*   Lab Work: None ordered   Testing/Procedures: None ordered   Follow-Up: At [facility], you and your health needs are our priority.  As part of our continuing mission to provide you with exceptional heart care, we have created designated Provider Care Teams.  These Care Teams include your primary Cardiologist (physician) and Advanced Practice Providers (APPs -  Physician Assistants and Nurse Practitioners) who all work together to provide you with the care you need, when you need it.  Your next appointment:   6 month(s)  The format for your next appointment:   In Person  Provider:   You will see one of the following Advanced Practice Providers on your designated Care Team:   Francis Dowse, [place] Casimiro Needle "Mardelle Matte" Lanna Poche, [place]    Thank you for choosing [facility]!!   [phone]  Other Instructions   Important Information About Sugar

## 2022-02-16 DIAGNOSIS — Z0000 Encounter for general adult medical examination without abnormal findings: Secondary | ICD-10-CM | POA: Diagnosis not present

## 2022-02-16 DIAGNOSIS — E559 Vitamin D deficiency, unspecified: Secondary | ICD-10-CM | POA: Diagnosis not present

## 2022-02-16 DIAGNOSIS — Z125 Encounter for screening for malignant neoplasm of prostate: Secondary | ICD-10-CM | POA: Diagnosis not present

## 2022-02-16 DIAGNOSIS — D649 Anemia, unspecified: Secondary | ICD-10-CM | POA: Diagnosis not present

## 2022-02-16 DIAGNOSIS — R7309 Other abnormal glucose: Secondary | ICD-10-CM | POA: Diagnosis not present

## 2022-02-16 DIAGNOSIS — I4891 Unspecified atrial fibrillation: Secondary | ICD-10-CM | POA: Diagnosis not present

## 2022-02-16 DIAGNOSIS — E785 Hyperlipidemia, unspecified: Secondary | ICD-10-CM | POA: Diagnosis not present

## 2022-02-18 DIAGNOSIS — K219 Gastro-esophageal reflux disease without esophagitis: Secondary | ICD-10-CM | POA: Diagnosis not present

## 2022-02-18 DIAGNOSIS — R413 Other amnesia: Secondary | ICD-10-CM | POA: Diagnosis not present

## 2022-02-18 DIAGNOSIS — I4891 Unspecified atrial fibrillation: Secondary | ICD-10-CM | POA: Diagnosis not present

## 2022-02-18 DIAGNOSIS — E785 Hyperlipidemia, unspecified: Secondary | ICD-10-CM | POA: Diagnosis not present

## 2022-02-18 DIAGNOSIS — E559 Vitamin D deficiency, unspecified: Secondary | ICD-10-CM | POA: Diagnosis not present

## 2022-02-18 DIAGNOSIS — R7309 Other abnormal glucose: Secondary | ICD-10-CM | POA: Diagnosis not present

## 2022-02-18 DIAGNOSIS — Z0000 Encounter for general adult medical examination without abnormal findings: Secondary | ICD-10-CM | POA: Diagnosis not present

## 2022-02-18 DIAGNOSIS — D6869 Other thrombophilia: Secondary | ICD-10-CM | POA: Diagnosis not present

## 2022-02-18 DIAGNOSIS — Z23 Encounter for immunization: Secondary | ICD-10-CM | POA: Diagnosis not present

## 2022-02-18 DIAGNOSIS — I10 Essential (primary) hypertension: Secondary | ICD-10-CM | POA: Diagnosis not present

## 2022-03-04 NOTE — Addendum Note (Signed)
Addended by: James Ivanoff D on: 03/04/2022 08:06 AM   Modules accepted: Orders

## 2022-03-25 ENCOUNTER — Other Ambulatory Visit (HOSPITAL_COMMUNITY): Payer: Self-pay

## 2022-03-25 DIAGNOSIS — I480 Paroxysmal atrial fibrillation: Secondary | ICD-10-CM

## 2022-03-25 MED ORDER — APIXABAN 5 MG PO TABS: 5.0000 mg | ORAL_TABLET | Freq: Two times a day (BID) | ORAL | 0 refills | Status: DC

## 2022-03-31 ENCOUNTER — Encounter (HOSPITAL_COMMUNITY): Payer: Self-pay | Admitting: *Deleted

## 2022-04-07 ENCOUNTER — Encounter: Payer: Self-pay | Admitting: Neurology

## 2022-04-07 ENCOUNTER — Ambulatory Visit (INDEPENDENT_AMBULATORY_CARE_PROVIDER_SITE_OTHER): Payer: PPO | Admitting: Neurology

## 2022-04-07 VITALS — BP 137/70 | HR 60 | Ht 75.0 in | Wt 266.0 lb

## 2022-04-07 DIAGNOSIS — G3184 Mild cognitive impairment, so stated: Secondary | ICD-10-CM

## 2022-04-07 NOTE — Progress Notes (Signed)
[facility]  PATIENT: Andrew Perkins DOB: [birth year]  REQUESTING CLINICIAN: London Pepper, MD HISTORY FROM: Patient and granddaughter  REASON FOR VISIT: Memory loss    HISTORICAL  CHIEF COMPLAINT:  Chief Complaint  Patient presents with   New Patient (Initial Visit)    Rm 13 with granddaughter. Here for consult on memory loss.    HISTORY OF PRESENT ILLNESS:  This is a 72 year old gentleman past medical history atrial fibrillation, hypertension, hyperlipidemia who is presenting with memory concern.  Patient reports for the past year or so he has been noted memory problem that he described as word finding difficulty.  He has difficulty finding the right words sometimes or remembering the name of places he has been before.  Other than that he feels like his memory is fine.  He lives alone he is able to maintain all activities of daily living.   He is here today with granddaughter who noted patient is somehow a forgetful.  Granddaughter reports that patient was watches a lot of movies but sometimes he is unable to tell the last movie that he watch.  She tells me sometimes patient gets mixed up about places or airport name from his hometown in [place]. Patient reports that his sleep is disrupted, sometime he wakes up and unable to fall asleep.    TBI:   No past history of TBI Stroke:   no past history of stroke Seizures:   no past history of seizures Sleep:   no history of sleep apnea.  Mood:   patient denies anxiety and depression, but granddaughter reports that he is more irritable  Family history of Dementia:   Denies  Functional status: independent in all  ADLs and IADLs Patient lives alone. Cooking: patient Cleaning: patient Shopping: patient Bathing: patient Toileting: patient Driving: patient, no recent accident  Bills: patient, denies being late Medications: patient  Ever left the stove on by accident?: denies Forget how to use items around the  house?: denies Getting lost going to familiar places?: denies Forgetting loved ones names?: denies Word finding difficulty? Yes Sleep: Disturbed but do take naps    OTHER MEDICAL CONDITIONS: Atrial fibrillation, Hypertension, hyperlipidemia,    REVIEW OF SYSTEMS: Full 14 system review of systems performed and negative with exception of: As noted in the HPI   ALLERGIES: Allergies  Allergen Reactions   Penicillins Other (See Comments)    syncope    HOME MEDICATIONS: Outpatient Medications Prior to Visit  Medication Sig Dispense Refill   amLODipine (NORVASC) 5 MG tablet Take 5 mg by mouth daily.     apixaban (ELIQUIS) 5 MG TABS tablet Take 1 tablet (5 mg total) by mouth 2 (two) times daily. 42 tablet 0   Ascorbic Acid (VITAMIN C PO) Take 1 tablet by mouth daily. Not sure the dose     atorvastatin (LIPITOR) 40 MG tablet Take 1 tablet (40 mg total) by mouth daily. 90 tablet 3   diclofenac sodium (VOLTAREN) 1 % GEL Apply 1 application  topically daily as needed for pain.     diltiazem (CARDIZEM) 30 MG tablet Take 1 tablet every 4 hours AS NEEDED for heart rate >100 30 tablet 1   famotidine (PEPCID) 10 MG tablet Take 10 mg by mouth as needed.      flecainide (TAMBOCOR) 100 MG tablet Take 1 tablet (100 mg total) by mouth 2 (two) times daily. 180 tablet 1   losartan (COZAAR) 100 MG tablet Take 100 mg by mouth daily.  metoprolol succinate (TOPROL XL) 25 MG 24 hr tablet Take 1 tablet (25 mg total) by mouth in the morning and at bedtime. 60 tablet 6   pantoprazole (PROTONIX) 40 MG tablet Take 40 mg by mouth as needed.      potassium chloride SA (KLOR-CON) 20 MEQ tablet Take 1 tablet (20 mEq total) by mouth daily. 90 tablet 3   psyllium (HYDROCIL/METAMUCIL) 95 % PACK Take 1 packet by mouth daily.     zinc gluconate 50 MG tablet Take 50 mg by mouth daily.     No facility-administered medications prior to visit.    PAST MEDICAL HISTORY: Past Medical History:  Diagnosis Date   Anemia     a. Mild per pt - previously on iron but did not tolerate.   Chronic neck pain 06/27/2017   Diabetes mellitus without complication (HCC)    borderline   Erectile dysfunction    Former consumption of alcohol    a. Abstinent as of 03/2013.   Hyperlipidemia    Hypertension    Left atrial enlargement    Mild aortic insufficiency    PAF (paroxysmal atrial fibrillation) (HCC)    SVT (supraventricular tachycardia)    Vitamin D deficiency     PAST SURGICAL HISTORY: Past Surgical History:  Procedure Laterality Date   CIRCUMCISION     at 72yr old   TONSILLECTOMY     WISDOM TOOTH EXTRACTION      FAMILY HISTORY: Family History  Problem Relation Age of Onset   Diabetes Mellitus II Brother    Asthma Brother    CAD Neg Hx    Stroke Neg Hx    Heart attack Neg Hx     SOCIAL HISTORY: Social History   Socioeconomic History   Marital status: Single    Spouse name: Not on file   Number of children: Not on file   Years of education: 12   Highest education level: Not on file  Occupational History   Occupation: Retired  Tobacco Use   Smoking status: Never   Smokeless tobacco: Never  Vaping Use   Vaping Use: Never used  Substance and Sexual Activity   Alcohol use: No    Alcohol/week: 0.0 standard drinks of alcohol   Drug use: No   Sexual activity: Not on file  Other Topics Concern   Not on file  Social History Narrative   [place]   Caffeine use: none   Right handed    Social Determinants of Health   Financial Resource Strain: Not on file  Food Insecurity: Not on file  Transportation Needs: Not on file  Physical Activity: Not on file  Stress: Not on file  Social Connections: Not on file  Intimate Partner Violence: Not on file   PHYSICAL EXAM  GENERAL EXAM/CONSTITUTIONAL: Vitals:  Vitals:   04/07/22 0850  BP: 137/70  Pulse: 60  Weight: 266 lb (120.7 kg)  Height: 6' 3"$  (1.905 m)   Body mass index is 33.25 kg/m. Wt Readings  from Last 3 Encounters:  04/07/22 266 lb (120.7 kg)  02/08/22 265 lb 3.2 oz (120.3 kg)  12/14/21 265 lb 3.2 oz (120.3 kg)   Patient is in no distress; well developed, nourished and groomed; neck is supple  EYES: Visual fields full to confrontation, Extraocular movements intacts,   MUSCULOSKELETAL: Gait, strength, tone, movements noted in Neurologic exam below  NEUROLOGIC: MENTAL STATUS:      No data to display  04/07/2022    8:54 AM  Montreal Cognitive Assessment   Visuospatial/ Executive (0/5) 4  Naming (0/3) 3  Attention: Read list of digits (0/2) 2  Attention: Read list of letters (0/1) 1  Attention: Serial 7 subtraction starting at 100 (0/3) 3  Language: Repeat phrase (0/2) 2  Language : Fluency (0/1) 0  Abstraction (0/2) 2  Delayed Recall (0/5) 2  Orientation (0/6) 6  Total 25  Adjusted Score (based on education) 25    CRANIAL NERVE:  2nd, 3rd, 4th, 6th- visual fields full to confrontation, extraocular muscles intact, no nystagmus 5th - facial sensation symmetric 7th - facial strength symmetric 8th - hearing intact 9th - palate elevates symmetrically, uvula midline 11th - shoulder shrug symmetric 12th - tongue protrusion midline  MOTOR:  normal bulk and tone, full strength in the BUE, BLE  SENSORY:  normal and symmetric to light touch  COORDINATION:  finger-nose-finger, fine finger movements normal  GAIT/STATION:  normal   DIAGNOSTIC DATA (LABS, IMAGING, TESTING) - I reviewed patient records, labs, notes, testing and imaging myself where available.  Lab Results  Component Value Date   WBC 6.9 11/19/2020   HGB 13.0 11/19/2020   HCT 38.8 11/19/2020   MCV 86 11/19/2020   PLT 239 11/19/2020      Component Value Date/Time   NA 141 12/16/2020 1006   K 3.8 12/16/2020 1006   CL 109 (H) 12/16/2020 1006   CO2 20 12/16/2020 1006   GLUCOSE 106 (H) 12/16/2020 1006   GLUCOSE 116 (H) 03/11/2019 1840   BUN 17 12/16/2020 1006   CREATININE  1.08 12/16/2020 1006   CALCIUM 9.1 12/16/2020 1006   PROT 7.0 11/19/2020 1019   ALBUMIN 4.3 11/19/2020 1019   AST 13 11/19/2020 1019   ALT 10 11/19/2020 1019   ALKPHOS 81 11/19/2020 1019   BILITOT 0.5 11/19/2020 1019   GFRNONAA >60 03/11/2019 1817   GFRAA >60 03/11/2019 1817   Lab Results  Component Value Date   CHOL 144 11/19/2020   HDL 33 (L) 11/19/2020   LDLCALC 88 11/19/2020   LDLDIRECT 91 11/19/2020   TRIG 130 11/19/2020   CHOLHDL 4.4 11/19/2020   Lab Results  Component Value Date   HGBA1C 5.9 (H) 04/18/2013   No results found for: "VITAMINB12" Lab Results  Component Value Date   TSH 1.770 11/19/2020     ASSESSMENT AND PLAN  73 y.o. year old male with history of atrial fibrillation, hypertension, hyperlipidemia who is presenting with memory problem described mainly as words finding difficulty. The patient is independent in all activities of daily living.  He lives alone, pays his own bills, still drives, denies any recent accident and denies being lost in familiar places.  On exam today he scored 25 out of 30 on the [place].  I have informed patient that this is likely normal cognition versus mild cognitive impairment.  At the moment I will get his dementia labs including B12, TSH, and ATN profile.  I will also obtain a MRI brain.  I will contact the patient to go over the results otherwise I will see him in 1 year for follow-up.  We discussed ways to reduce the risk of developing Alzheimer disease including exercise, keeping a good health, good diet and good sleep pattern.  He voices understanding    1. Mild cognitive impairment      Patient Instructions  Dementia labs including TSH, B12 level and ATN profile MRI brain without contrast Continue your other medications Follow-up  in 1 year or sooner if worse   There are well-accepted and sensible ways to reduce risk for Alzheimers disease and other degenerative brain disorders .  Exercise Daily Walk A daily 20 minute  walk should be part of your routine. Disease related apathy can be a significant roadblock to exercise and the only way to overcome this is to make it a daily routine and perhaps have a reward at the end (something your loved one loves to eat or drink perhaps) or a personal trainer coming to the home can also be very useful. Most importantly, the patient is much more likely to exercise if the caregiver / spouse does it with him/her. In general a structured, repetitive schedule is best.  General Health: Any diseases which effect your body will effect your brain such as a pneumonia, urinary infection, blood clot, heart attack or stroke. Keep contact with your primary care doctor for regular follow ups.  Sleep. A good nights sleep is healthy for the brain. Seven hours is recommended. If you have insomnia or poor sleep habits we can give you some instructions. If you have sleep apnea wear your mask.  Diet: Eating a heart healthy diet is also a good idea; fish and poultry instead of red meat, nuts (mostly non-peanuts), vegetables, fruits, olive oil or canola oil (instead of butter), minimal salt (use other spices to flavor foods), whole grain rice, bread, cereal and pasta and wine in moderation.Research is now showing that the MIND diet, which is a combination of The Mediterranean diet and the DASH diet, is beneficial for cognitive processing and longevity. Information about this diet can be found in The MIND Diet, a book by Doyne Keel, MS, RDN, and online at NotebookDistributors.si  Finances, Power of Attorney and Advance Directives: You should consider putting legal safeguards in place with regard to financial and medical decision making. While the spouse always has power of attorney for medical and financial issues in the absence of any form, you should consider what you want in case the spouse / caregiver is no longer around or capable of making decisions.   Orders Placed This  Encounter  Procedures   MR BRAIN WO CONTRAST   Vitamin B12   ATN PROFILE   TSH    No orders of the defined types were placed in this encounter.   Return in about 1 year (around 04/08/2023).  I have spent a total of 65 minutes dedicated to this patient today, preparing to see patient, performing a medically appropriate examination and evaluation, ordering tests and/or medications and procedures, and counseling and educating the patient/family/caregiver; independently interpreting result and communicating results to the family/patient/caregiver; and documenting clinical information in the electronic medical record.   Alric Ran, MD 04/07/2022, 9:54 AM  [facility] [address] [place], [place] [address] [phone]

## 2022-04-07 NOTE — Patient Instructions (Signed)
Dementia labs including TSH, B12 level and ATN profile MRI brain without contrast Continue your other medications Follow-up in 1 year or sooner if worse   There are well-accepted and sensible ways to reduce risk for Alzheimers disease and other degenerative brain disorders .  Exercise Daily Walk A daily 20 minute walk should be part of your routine. Disease related apathy can be a significant roadblock to exercise and the only way to overcome this is to make it a daily routine and perhaps have a reward at the end (something your loved one loves to eat or drink perhaps) or a personal trainer coming to the home can also be very useful. Most importantly, the patient is much more likely to exercise if the caregiver / spouse does it with him/her. In general a structured, repetitive schedule is best.  General Health: Any diseases which effect your body will effect your brain such as a pneumonia, urinary infection, blood clot, heart attack or stroke. Keep contact with your primary care doctor for regular follow ups.  Sleep. A good nights sleep is healthy for the brain. Seven hours is recommended. If you have insomnia or poor sleep habits we can give you some instructions. If you have sleep apnea wear your mask.  Diet: Eating a heart healthy diet is also a good idea; fish and poultry instead of red meat, nuts (mostly non-peanuts), vegetables, fruits, olive oil or canola oil (instead of butter), minimal salt (use other spices to flavor foods), whole grain rice, bread, cereal and pasta and wine in moderation.Research is now showing that the MIND diet, which is a combination of The Mediterranean diet and the DASH diet, is beneficial for cognitive processing and longevity. Information about this diet can be found in The MIND Diet, a book by Doyne Keel, MS, RDN, and online at [employer]bookDist[employer].si  Finances, Power of Attorney and Advance Directives: You should consider putting legal  safeguards in place with regard to financial and medical decision making. While the spouse always has power of attorney for medical and financial issues in the absence of any form, you should consider what you want in case the spouse / caregiver is no longer around or capable of making decisions.

## 2022-04-08 LAB — VITAMIN B12: Vitamin B-12: 465 pg/mL (ref 232–1245)

## 2022-04-08 LAB — TSH: TSH: 1.73 u[IU]/mL (ref 0.450–4.500)

## 2022-04-13 ENCOUNTER — Encounter (HOSPITAL_COMMUNITY): Payer: Self-pay | Admitting: Nurse Practitioner

## 2022-04-13 ENCOUNTER — Ambulatory Visit (HOSPITAL_COMMUNITY)
Admission: RE | Admit: 2022-04-13 | Discharge: 2022-04-13 | Disposition: A | Discharge status: Home / Self Care | Payer: PPO | Source: Ambulatory Visit | Attending: Nurse Practitioner | Admitting: Nurse Practitioner

## 2022-04-13 VITALS — BP 130/78 | HR 66 | Ht 75.0 in | Wt 263.8 lb

## 2022-04-13 DIAGNOSIS — Z7901 Long term (current) use of anticoagulants: Secondary | ICD-10-CM | POA: Insufficient documentation

## 2022-04-13 DIAGNOSIS — I10 Essential (primary) hypertension: Secondary | ICD-10-CM | POA: Insufficient documentation

## 2022-04-13 DIAGNOSIS — D6869 Other thrombophilia: Secondary | ICD-10-CM

## 2022-04-13 DIAGNOSIS — R002 Palpitations: Secondary | ICD-10-CM | POA: Insufficient documentation

## 2022-04-13 DIAGNOSIS — Z79899 Other long term (current) drug therapy: Secondary | ICD-10-CM | POA: Diagnosis not present

## 2022-04-13 DIAGNOSIS — I480 Paroxysmal atrial fibrillation: Secondary | ICD-10-CM | POA: Insufficient documentation

## 2022-04-13 DIAGNOSIS — I4891 Unspecified atrial fibrillation: Secondary | ICD-10-CM

## 2022-04-13 DIAGNOSIS — I471 Supraventricular tachycardia, unspecified: Secondary | ICD-10-CM | POA: Insufficient documentation

## 2022-04-13 LAB — ATN PROFILE
A -- Beta-amyloid 42/40 Ratio: 0.099 — ABNORMAL LOW (ref >0.102)
Beta-amyloid 40: 212.94 pg/mL
Beta-amyloid 42: 21.14 pg/mL
N -- NfL, Plasma: 1.48 pg/mL (ref 0.00–7.64)
T -- p-tau181: 0.69 pg/mL (ref 0.00–0.97)

## 2022-04-13 NOTE — Progress Notes (Signed)
Patient ID: Andrew Perkins, male   DOB: [birth year], 72 y.o.   MRN: [id number]     Patient ID: Andrew Perkins, male   DOB: [birth year], 72 y.o.   MRN: [id number]   Date:  04/13/2022   PCP:  London Pepper, MD  Cardiologist:  Dr Johnsie Cancel Primary Electrophysiologist: Dr. Quentin Ore     f/u flecainide  use   History of Present Illness: Andrew Perkins is a 72 y.o. male who presents today for f/u in the afib clinic.   The patient reports initially being diagnosed with atrial fibrillation 2/15 after presenting with tachypalpitations.  In retrospect, he thinks that he has had afib for 4-5 years.  Episodes were initially short, lasting several minutes.  At that time, he attributed them to "anxiety".  He developed sustained tachycardia 2/15 and he presented to Dr Darien Ramus office with documented afib on ekg.  He was placed on eliquis and metoprolol.  He reports increasing frequency and duration of atrial fibrillation.  Several months ago, he was given flecainide as a "pill in pocket" approach.  He has required flecainide several times since then.  He had an event monitor placed 2/16 which has documented a more regular narrow complex SVT.  He has not tried daily AAD therapy though  He does take metoprolol twice daily.  He has very frequent palpitations which he finds worrisome.  He saw Dr. Rayann Heman in consultation 4/16 and was given options to treat afib. He wanted to pursue AAD and avoid ablation.  He was started on flecainide 100 mg bid . He appears to be tolerating without adverse effects and most importantly has very little afib burden on drug. He noticed afib for one hour last Sunday and this is the most afib that he has noticed.  He reports  negative sleep study. He does try to walk daily. Remains on DOAC with chadsvasc score of 2. He states that he does have mild anemia and takes iron. He has had a colonoscopy in the last year and did have polys which were removed.  F/u clinic 07/1515, continue on flecainide and is doing  will with litle afib burden. He report that he had 3 episode in early April, longest epiosde was 3 hours at v rate of 102 bpm, next episode 3 days later at 115 bpm, lasted one hour, last epiosde lasted 5 mins, at 99 bpm. Has not any further episodes since that 8th of April. He is still satisfied with afib management. He can now continue with what he is doing in afib vrs getting lightheaded and having to lay down.  Continues on eliquis, no bleeding issues.  F/u in afib clinic 12/08/15 and reports small afib burden. He is till happy with afib management. He is walking on a regular basis. Afib episodes can last 5 mins to an hour. No bleeding issues with eliquis.  F/u in afib clinc 04/07/16. He reports increase in afib burden over the last week because he traveled out of town and was out of his usual routine with diet and sleeping. He still is happy with flecainide and wants to continue with drug.   Return to afib clinic 7/18. Continues with flecainide and reports very little afib burden. Feels well. Continues with eliquis 5 mg bid, no bleeding issues currently.  F/u 10/25/17,he reports very little afib burden Is happy with current management with flecainide.. No bleeding issues. Had labs drawn with PCP yesterday.  F/u afib clinic 11/15/18. He is still happy with his afib burden on flecainide.  He has 1-2 outbreaks a month, lasting around one hour. He does not have to stop what he is doing when he is in afib. He usually knows only  because of higher heart rates on his watch.   F/u in afib clinic, 11/18/19. He in Sinus brady at 57 bpm, continues on flecainide and BB. He guesstimates around 10 afib episodes over the last year. Most episodes are short, 10-15 mins and his longest episode was around 6 hours. He  took an extra flecainide and BB and he converted. Continues  on eliquis 5 mg bid for a CHA2DS2VASc score of 2.   F/u in afib clinic, 09/01/20. He reports that he was visiting his daughter in [place] middle of  June, he had chills with elevated heart rate. He went to the ER there and tested COVID positive. He was in sinus tachycardia, EKG results reviewed in Epic. [name] He had elevated HR and chills for another 24 hours and then his symptoms became minimal. He has noted some short elevations of heart rate since then. Apple strips reviewed and appear to show sinus tach. He will occassionally will take a prn metoprolol.   F/u in the afib clinic, 09/08/21. He reports since July 5th he has had 3 afib episodes usually lasting around 4-6 hours. He will  take an extra metoprolol. No specific trigger found. No alcohol use. He is in SR today.  F/u, 10/19/21, from last visit for increased afib burden. For the last month, he has been in rhythm without any afib. He is happy to stay with flecainide for now, does not see a reason to change.   F/u in afib clinic, 04/13/22. He reports very rare elevated heart beat episode that is not sustained. He saw Dr. Quentin Ore  in December 2023 and a monitor that Dr. Johnsie Cancel placed was reviewed and it was noted some episodes of AT and PAC's. He changed his BB to succinate  and continued with flecainide.   Today, he denies symptoms of chest pain, shortness of breath, orthopnea, PND, lower extremity edema, claudication, dizziness, presyncope, syncope, bleeding, or neurologic sequela. The patient is tolerating medications without difficulties and is otherwise without complaint today.    Past Medical History:  Diagnosis Date   Anemia    a. Mild per pt - previously on iron but did not tolerate.   Chronic neck pain 06/27/2017   Diabetes mellitus without complication (HCC)    borderline   Erectile dysfunction    Former consumption of alcohol    a. Abstinent as of 03/2013.   Hyperlipidemia    Hypertension    Left atrial enlargement    Mild aortic insufficiency    PAF (paroxysmal atrial fibrillation) (HCC)    SVT (supraventricular tachycardia)    Vitamin D deficiency    Past Surgical  History:  Procedure Laterality Date   CIRCUMCISION     at 72yr old   TONSILLECTOMY     WISDOM TOOTH EXTRACTION       Current Outpatient Medications  Medication Sig Dispense Refill   amLODipine (NORVASC) 5 MG tablet Take 5 mg by mouth daily.     apixaban (ELIQUIS) 5 MG TABS tablet Take 1 tablet (5 mg total) by mouth 2 (two) times daily. 42 tablet 0   Ascorbic Acid (VITAMIN C PO) Take 1 tablet by mouth daily. Not sure the dose     atorvastatin (LIPITOR) 40 MG tablet Take 1 tablet (40 mg total) by mouth daily. 90 tablet 3   diclofenac  sodium (VOLTAREN) 1 % GEL Apply 1 application  topically daily as needed for pain.     diltiazem (CARDIZEM) 30 MG tablet Take 1 tablet every 4 hours AS NEEDED for heart rate >100 30 tablet 1   ergocalciferol (VITAMIN D2) 1.25 MG (50000 UT) capsule 1 capsule Orally once a week x 8 weeks     famotidine (PEPCID) 10 MG tablet Take 10 mg by mouth as needed.      flecainide (TAMBOCOR) 100 MG tablet Take 1 tablet (100 mg total) by mouth 2 (two) times daily. 180 tablet 1   losartan (COZAAR) 100 MG tablet Take 100 mg by mouth daily.     metoprolol succinate (TOPROL XL) 25 MG 24 hr tablet Take 1 tablet (25 mg total) by mouth in the morning and at bedtime. 60 tablet 6   pantoprazole (PROTONIX) 40 MG tablet Take 40 mg by mouth as needed.      psyllium (HYDROCIL/METAMUCIL) 95 % PACK Take 1 packet by mouth daily.     zinc gluconate 50 MG tablet Take 50 mg by mouth daily.     No current facility-administered medications for this encounter.    Allergies:   Penicillins   Social History:  The patient  reports that he has never smoked. He has never used smokeless tobacco. He reports that he does not drink alcohol and does not use drugs.   Family History:  The patient's  family history includes Asthma in his brother; Diabetes Mellitus II in his brother.    ROS:  Please see the history of present illness.   All other systems are reviewed and negative.    PHYSICAL  EXAM: VS:  BP 130/78   Pulse 66   Ht 6' 3"$  (1.905 m)   Wt 119.7 kg   BMI 32.97 kg/m  , BMI Body mass index is 32.97 kg/m. GEN: overweight in no acute distress  HEENT: normal  Neck: no JVD, carotid bruits, or masses Cardiac: RRR; no murmurs, rubs, or gallops,no edema  Respiratory:  clear to auscultation bilaterally, normal work of breathing GI: soft, nontender, nondistended, + BS MS: no deformity or atrophy  Skin: warm and dry  Neuro:  Strength and sensation are intact Psych: euthymic mood, full affect  EKG:  Vent. rate 66 BPM PR interval 198 ms QRS duration 108 ms QT/QTcB 432/452 ms P-R-T axes 37 -7 20 Normal sinus rhythm Moderate voltage criteria for LVH, may be normal variant ( R in aVL , Cornell product ) Borderline ECG When compared with ECG of 20-Oct-2021 09:22, PREVIOUS ECG IS PRESENT    Wt Readings from Last 3 Encounters:  04/13/22 119.7 kg  04/07/22 120.7 kg  02/08/22 120.3 kg       ASSESSMENT AND PLAN:  1.  Paroxysmal atrial fibrillation/ SVT/AT/PAC's Low burden afib  Seen by Dr. Quentin Ore in December for AT/PAC's on event monitor  Continue flecainide 100 mg bid/metoprolol succinate 25 mg bid. He has 30 mg diltiazem to use as needed for tachycardia  Continue eliquis 5 mg bid  for chads2vasc score of at least 2  2. HTN Stable    F/u with App in July set up by Dr. Bonn[facility] clinic as needed    Geroge Baseman. Damarius Karnes, [facility] [address] [place], [place] [address] [phone]

## 2022-04-14 ENCOUNTER — Ambulatory Visit
Admission: RE | Admit: 2022-04-14 | Discharge: 2022-04-14 | Disposition: A | Discharge status: Home / Self Care | Payer: PPO | Source: Ambulatory Visit | Attending: Neurology | Admitting: Neurology

## 2022-04-14 DIAGNOSIS — G3184 Mild cognitive impairment, so stated: Secondary | ICD-10-CM | POA: Diagnosis not present

## 2022-05-04 ENCOUNTER — Other Ambulatory Visit (HOSPITAL_COMMUNITY): Payer: Self-pay | Admitting: *Deleted

## 2022-05-04 MED ORDER — METOPROLOL SUCCINATE ER 25 MG PO TB24: 25.0000 mg | ORAL_TABLET | Freq: Two times a day (BID) | ORAL | 2 refills | Status: DC

## 2022-05-31 ENCOUNTER — Other Ambulatory Visit (HOSPITAL_COMMUNITY): Payer: Self-pay | Admitting: *Deleted

## 2022-05-31 MED ORDER — FLECAINIDE ACETATE 100 MG PO TABS: 100.0000 mg | ORAL_TABLET | Freq: Two times a day (BID) | ORAL | 2 refills | Status: DC

## 2022-06-07 DIAGNOSIS — I4891 Unspecified atrial fibrillation: Secondary | ICD-10-CM | POA: Diagnosis not present

## 2022-06-07 DIAGNOSIS — D6869 Other thrombophilia: Secondary | ICD-10-CM | POA: Diagnosis not present

## 2022-06-07 DIAGNOSIS — G4700 Insomnia, unspecified: Secondary | ICD-10-CM | POA: Diagnosis not present

## 2022-06-07 DIAGNOSIS — I700 Atherosclerosis of aorta: Secondary | ICD-10-CM | POA: Diagnosis not present

## 2022-08-05 ENCOUNTER — Other Ambulatory Visit: Payer: Self-pay | Admitting: Cardiology

## 2022-09-15 DIAGNOSIS — I480 Paroxysmal atrial fibrillation: Secondary | ICD-10-CM | POA: Diagnosis not present

## 2022-09-15 DIAGNOSIS — D649 Anemia, unspecified: Secondary | ICD-10-CM | POA: Diagnosis not present

## 2022-09-15 DIAGNOSIS — I10 Essential (primary) hypertension: Secondary | ICD-10-CM | POA: Diagnosis not present

## 2022-09-15 DIAGNOSIS — R7309 Other abnormal glucose: Secondary | ICD-10-CM | POA: Diagnosis not present

## 2022-09-15 DIAGNOSIS — Z1211 Encounter for screening for malignant neoplasm of colon: Secondary | ICD-10-CM | POA: Diagnosis not present

## 2022-09-15 DIAGNOSIS — R635 Abnormal weight gain: Secondary | ICD-10-CM | POA: Diagnosis not present

## 2022-09-15 DIAGNOSIS — I4891 Unspecified atrial fibrillation: Secondary | ICD-10-CM | POA: Diagnosis not present

## 2022-09-15 DIAGNOSIS — I700 Atherosclerosis of aorta: Secondary | ICD-10-CM | POA: Diagnosis not present

## 2022-09-15 DIAGNOSIS — E559 Vitamin D deficiency, unspecified: Secondary | ICD-10-CM | POA: Diagnosis not present

## 2022-09-23 DIAGNOSIS — Z1211 Encounter for screening for malignant neoplasm of colon: Secondary | ICD-10-CM | POA: Diagnosis not present

## 2022-11-02 DIAGNOSIS — K219 Gastro-esophageal reflux disease without esophagitis: Secondary | ICD-10-CM | POA: Diagnosis not present

## 2022-11-02 DIAGNOSIS — K5900 Constipation, unspecified: Secondary | ICD-10-CM | POA: Diagnosis not present

## 2022-11-02 DIAGNOSIS — H93A3 Pulsatile tinnitus, bilateral: Secondary | ICD-10-CM | POA: Diagnosis not present

## 2022-11-02 DIAGNOSIS — D6869 Other thrombophilia: Secondary | ICD-10-CM | POA: Diagnosis not present

## 2022-11-02 DIAGNOSIS — I700 Atherosclerosis of aorta: Secondary | ICD-10-CM | POA: Diagnosis not present

## 2022-11-02 DIAGNOSIS — M1990 Unspecified osteoarthritis, unspecified site: Secondary | ICD-10-CM | POA: Diagnosis not present

## 2022-11-02 DIAGNOSIS — I10 Essential (primary) hypertension: Secondary | ICD-10-CM | POA: Diagnosis not present

## 2022-11-02 DIAGNOSIS — E669 Obesity, unspecified: Secondary | ICD-10-CM | POA: Diagnosis not present

## 2022-11-02 DIAGNOSIS — I480 Paroxysmal atrial fibrillation: Secondary | ICD-10-CM | POA: Diagnosis not present

## 2022-11-02 DIAGNOSIS — E785 Hyperlipidemia, unspecified: Secondary | ICD-10-CM | POA: Diagnosis not present

## 2022-11-16 DIAGNOSIS — I700 Atherosclerosis of aorta: Secondary | ICD-10-CM | POA: Diagnosis not present

## 2022-11-16 DIAGNOSIS — Z6832 Body mass index (BMI) 32.0-32.9, adult: Secondary | ICD-10-CM | POA: Diagnosis not present

## 2022-11-16 DIAGNOSIS — E559 Vitamin D deficiency, unspecified: Secondary | ICD-10-CM | POA: Diagnosis not present

## 2022-11-16 DIAGNOSIS — E785 Hyperlipidemia, unspecified: Secondary | ICD-10-CM | POA: Diagnosis not present

## 2022-11-16 DIAGNOSIS — Z1331 Encounter for screening for depression: Secondary | ICD-10-CM | POA: Diagnosis not present

## 2022-11-16 DIAGNOSIS — R7303 Prediabetes: Secondary | ICD-10-CM | POA: Diagnosis not present

## 2022-11-16 DIAGNOSIS — E669 Obesity, unspecified: Secondary | ICD-10-CM | POA: Diagnosis not present

## 2022-11-16 DIAGNOSIS — I10 Essential (primary) hypertension: Secondary | ICD-10-CM | POA: Diagnosis not present

## 2022-11-23 ENCOUNTER — Encounter: Payer: Self-pay | Admitting: Cardiovascular Disease

## 2022-11-23 ENCOUNTER — Ambulatory Visit: Payer: PPO | Attending: Cardiovascular Disease | Admitting: Cardiovascular Disease

## 2022-11-23 VITALS — BP 122/70 | HR 61 | Ht 74.0 in | Wt 257.0 lb

## 2022-11-23 DIAGNOSIS — I480 Paroxysmal atrial fibrillation: Secondary | ICD-10-CM | POA: Diagnosis not present

## 2022-11-23 DIAGNOSIS — D6869 Other thrombophilia: Secondary | ICD-10-CM | POA: Diagnosis not present

## 2022-11-23 DIAGNOSIS — E785 Hyperlipidemia, unspecified: Secondary | ICD-10-CM

## 2022-11-23 DIAGNOSIS — I10 Essential (primary) hypertension: Secondary | ICD-10-CM

## 2022-11-23 MED ORDER — APIXABAN 5 MG PO TABS: 5.0000 mg | ORAL_TABLET | Freq: Two times a day (BID) | ORAL | Status: DC

## 2022-11-23 NOTE — Addendum Note (Signed)
Addended by: Virl Axe, Emilianna Barlowe L on: 11/23/2022 09:40 AM   Modules accepted: Orders

## 2022-11-23 NOTE — Patient Instructions (Signed)

## 2022-11-30 DIAGNOSIS — I10 Essential (primary) hypertension: Secondary | ICD-10-CM | POA: Diagnosis not present

## 2022-11-30 DIAGNOSIS — R0609 Other forms of dyspnea: Secondary | ICD-10-CM | POA: Diagnosis not present

## 2022-11-30 DIAGNOSIS — E559 Vitamin D deficiency, unspecified: Secondary | ICD-10-CM | POA: Diagnosis not present

## 2022-11-30 DIAGNOSIS — R7303 Prediabetes: Secondary | ICD-10-CM | POA: Diagnosis not present

## 2022-11-30 DIAGNOSIS — Z6832 Body mass index (BMI) 32.0-32.9, adult: Secondary | ICD-10-CM | POA: Diagnosis not present

## 2022-11-30 DIAGNOSIS — E785 Hyperlipidemia, unspecified: Secondary | ICD-10-CM | POA: Diagnosis not present

## 2022-11-30 DIAGNOSIS — E66811 Obesity, class 1: Secondary | ICD-10-CM | POA: Diagnosis not present

## 2022-11-30 DIAGNOSIS — I700 Atherosclerosis of aorta: Secondary | ICD-10-CM | POA: Diagnosis not present

## 2022-12-14 DIAGNOSIS — I700 Atherosclerosis of aorta: Secondary | ICD-10-CM | POA: Diagnosis not present

## 2022-12-14 DIAGNOSIS — E559 Vitamin D deficiency, unspecified: Secondary | ICD-10-CM | POA: Diagnosis not present

## 2022-12-14 DIAGNOSIS — R7303 Prediabetes: Secondary | ICD-10-CM | POA: Diagnosis not present

## 2022-12-14 DIAGNOSIS — I10 Essential (primary) hypertension: Secondary | ICD-10-CM | POA: Diagnosis not present

## 2022-12-14 DIAGNOSIS — E785 Hyperlipidemia, unspecified: Secondary | ICD-10-CM | POA: Diagnosis not present

## 2022-12-14 DIAGNOSIS — E66811 Obesity, class 1: Secondary | ICD-10-CM | POA: Diagnosis not present

## 2022-12-14 DIAGNOSIS — R0609 Other forms of dyspnea: Secondary | ICD-10-CM | POA: Diagnosis not present

## 2022-12-14 DIAGNOSIS — Z6832 Body mass index (BMI) 32.0-32.9, adult: Secondary | ICD-10-CM | POA: Diagnosis not present

## 2023-01-04 ENCOUNTER — Other Ambulatory Visit: Payer: Self-pay

## 2023-01-04 DIAGNOSIS — I4891 Unspecified atrial fibrillation: Secondary | ICD-10-CM

## 2023-01-04 MED ORDER — APIXABAN 5 MG PO TABS: ORAL_TABLET | ORAL | 0 refills | Status: DC

## 2023-01-04 NOTE — Telephone Encounter (Signed)
Prescription refill request for Eliquis received. Indication:AFIB Last office visit:10/24 ZDG:LOVFI labs Age: 72 Weight:116.6  kg  Prescription refilled

## 2023-01-12 DIAGNOSIS — I10 Essential (primary) hypertension: Secondary | ICD-10-CM | POA: Diagnosis not present

## 2023-01-12 DIAGNOSIS — E66811 Obesity, class 1: Secondary | ICD-10-CM | POA: Diagnosis not present

## 2023-01-12 DIAGNOSIS — R7303 Prediabetes: Secondary | ICD-10-CM | POA: Diagnosis not present

## 2023-01-12 DIAGNOSIS — I700 Atherosclerosis of aorta: Secondary | ICD-10-CM | POA: Diagnosis not present

## 2023-01-12 DIAGNOSIS — E559 Vitamin D deficiency, unspecified: Secondary | ICD-10-CM | POA: Diagnosis not present

## 2023-01-12 DIAGNOSIS — Z6832 Body mass index (BMI) 32.0-32.9, adult: Secondary | ICD-10-CM | POA: Diagnosis not present

## 2023-01-12 DIAGNOSIS — E785 Hyperlipidemia, unspecified: Secondary | ICD-10-CM | POA: Diagnosis not present

## 2023-02-03 DIAGNOSIS — K649 Unspecified hemorrhoids: Secondary | ICD-10-CM | POA: Diagnosis not present

## 2023-02-03 DIAGNOSIS — K219 Gastro-esophageal reflux disease without esophagitis: Secondary | ICD-10-CM | POA: Diagnosis not present

## 2023-02-03 DIAGNOSIS — R933 Abnormal findings on diagnostic imaging of other parts of digestive tract: Secondary | ICD-10-CM | POA: Diagnosis not present

## 2023-02-03 DIAGNOSIS — K5909 Other constipation: Secondary | ICD-10-CM | POA: Diagnosis not present

## 2023-02-23 ENCOUNTER — Other Ambulatory Visit: Payer: Self-pay | Admitting: *Deleted

## 2023-02-23 DIAGNOSIS — I480 Paroxysmal atrial fibrillation: Secondary | ICD-10-CM

## 2023-02-23 MED ORDER — APIXABAN 5 MG PO TABS
5.0000 mg | ORAL_TABLET | Freq: Two times a day (BID) | ORAL | 0 refills | Status: DC
Start: 2023-02-23 — End: 2023-02-28

## 2023-02-23 NOTE — Telephone Encounter (Signed)
 Prescription refill request for Eliquis  received. Indication: PAF Last office visit: 11/23/22  SHAUNNA Emmer MD Scr: 1.06 on 01/26/21 Age: 73 Weight: 116.6kg  Based on above findings Eliquis  5mg  twice daily is the appropriate dose.  Pt has not had requested lab work.  LM pt needs to get labs for future refills.  Refill approved x 1.

## 2023-02-28 ENCOUNTER — Telehealth: Payer: Self-pay | Admitting: Cardiovascular Disease

## 2023-02-28 ENCOUNTER — Other Ambulatory Visit: Payer: Self-pay | Admitting: *Deleted

## 2023-02-28 DIAGNOSIS — I480 Paroxysmal atrial fibrillation: Secondary | ICD-10-CM

## 2023-02-28 MED ORDER — APIXABAN 5 MG PO TABS: 5.0000 mg | ORAL_TABLET | Freq: Two times a day (BID) | ORAL | 1 refills | Status: DC

## 2023-02-28 MED ORDER — APIXABAN 5 MG PO TABS: ORAL_TABLET | ORAL | 0 refills | Status: DC

## 2023-02-28 NOTE — Telephone Encounter (Signed)
 Eliquis  5mg  refill request received. Patient is 73 years old, weight-116.6kg, Crea-1.30 on via [facility] from [place], [place], and last seen by Dr. Delford on 11/23/22. Dose is appropriate based on dosing criteria. Will send in refill to requested pharmacy.

## 2023-02-28 NOTE — Telephone Encounter (Signed)
 Prescription refill request for Eliquis received. Indication:afib Last office visit:10/24 ONG:EXBMW labs Age: 73 Weight:116.6  kg  Prescription refilled

## 2023-02-28 NOTE — Telephone Encounter (Signed)
*  STAT* If patient is at the pharmacy, call can be transferred to refill team.   1. Which medications need to be refilled? (please list name of each medication and dose if known) apixaban  (ELIQUIS ) 5 MG TABS tablet   2. Which pharmacy/location (including street and city if local pharmacy) is medication to be sent to? [id number] [place],  - [address] Phone: [phone]  Fax: [phone]     3. Do they need a 30 day or 90 day supply? 90

## 2023-03-01 DIAGNOSIS — K219 Gastro-esophageal reflux disease without esophagitis: Secondary | ICD-10-CM | POA: Diagnosis not present

## 2023-03-01 DIAGNOSIS — Z0000 Encounter for general adult medical examination without abnormal findings: Secondary | ICD-10-CM | POA: Diagnosis not present

## 2023-03-08 ENCOUNTER — Other Ambulatory Visit: Payer: Self-pay

## 2023-03-08 DIAGNOSIS — I480 Paroxysmal atrial fibrillation: Secondary | ICD-10-CM

## 2023-03-08 MED ORDER — APIXABAN 5 MG PO TABS: 5.0000 mg | ORAL_TABLET | Freq: Two times a day (BID) | ORAL | 1 refills | Status: DC

## 2023-03-08 NOTE — Telephone Encounter (Signed)
 Pt calling back stating that he had labs done, checking on status of refill. Requesting 90 day sent to [facility]

## 2023-03-09 ENCOUNTER — Other Ambulatory Visit (HOSPITAL_COMMUNITY): Payer: Self-pay

## 2023-03-09 MED ORDER — FLECAINIDE ACETATE 100 MG PO TABS: 100.0000 mg | ORAL_TABLET | Freq: Two times a day (BID) | ORAL | 2 refills | Status: DC

## 2023-04-10 ENCOUNTER — Telehealth: Payer: Self-pay | Admitting: Neurology

## 2023-04-10 ENCOUNTER — Ambulatory Visit: Payer: PPO | Admitting: Neurology

## 2023-04-10 ENCOUNTER — Encounter: Payer: Self-pay | Admitting: Neurology

## 2023-04-10 VITALS — BP 130/70 | HR 132 | Ht 75.0 in | Wt 259.5 lb

## 2023-04-10 DIAGNOSIS — G3184 Mild cognitive impairment, so stated: Secondary | ICD-10-CM

## 2023-04-10 MED ORDER — DONEPEZIL HCL 5 MG PO TABS: 5.0000 mg | ORAL_TABLET | Freq: Every day | ORAL | 11 refills | Status: DC

## 2023-04-10 NOTE — Telephone Encounter (Signed)
 Referral for neuropsychology fax to [facility]. [phone], Fax: [phone]

## 2023-04-10 NOTE — Telephone Encounter (Signed)
 Andrew Perkins

## 2023-04-10 NOTE — Progress Notes (Signed)
[facility]  PATIENT: Andrew Perkins DOB: [birth year]  REQUESTING CLINICIAN: Farris Has, MD HISTORY FROM: Patient and granddaughter  REASON FOR VISIT: Memory loss    HISTORICAL  CHIEF COMPLAINT:  Chief Complaint  Patient presents with   Memory Loss    Rm13, alone, needs metabolic panel to get eliquis refilled if provider is ordering labs, Memory loss: MOCA 21   INTERVAL HISTORY 04/10/2023:  Patient presents today for follow-up, last visit was a year August, since then he tells me that he has been doing well.  At last visit, we completed ATN profile which show a low beta 42/40 ratio and a normal P-Tau.  His MRI brain also showed atrophy in the temporal regions.  He still lives alone, denies any difficulty maintaining his activity of daily living, denies being late on his bills, still drives and denies any difficulty with driving or getting lost going to familiar place.  However he told me today that he forgot the name of his mortgage company, that he has been sending payment for the past 10 years.  He does exercise, at least 3-4 times weekly.  Denies any falls.   HISTORY OF PRESENT ILLNESS:  This is a 73 year old gentleman past medical history atrial fibrillation, hypertension, hyperlipidemia who is presenting with memory concern.  Patient reports for the past year or so he has been noted memory problem that he described as word finding difficulty.  He has difficulty finding the right words sometimes or remembering the name of places he has been before.  Other than that he feels like his memory is fine.  He lives alone he is able to maintain all activities of daily living.   He is here today with granddaughter who noted patient is somehow a forgetful.  Granddaughter reports that patient was watches a lot of movies but sometimes he is unable to tell the last movie that he watch.  She tells me sometimes patient gets mixed up about places or airport name from his hometown in  [place]. Patient reports that his sleep is disrupted, sometime he wakes up and unable to fall asleep.    TBI:   No past history of TBI Stroke:   no past history of stroke Seizures:   no past history of seizures Sleep:   no history of sleep apnea.  Mood:   patient denies anxiety and depression, but granddaughter reports that he is more irritable  Family history of Dementia:   Denies  Functional status: independent in all  ADLs and IADLs Patient lives alone. Cooking: patient Cleaning: patient Shopping: patient Bathing: patient Toileting: patient Driving: patient, no recent accident  Bills: patient, denies being late Medications: patient  Ever left the stove on by accident?: denies Forget how to use items around the house?: denies Getting lost going to familiar places?: denies Forgetting loved ones names?: denies Word finding difficulty? Yes Sleep: Disturbed but do take naps    OTHER MEDICAL CONDITIONS: Atrial fibrillation, Hypertension, hyperlipidemia,    REVIEW OF SYSTEMS: Full 14 system review of systems performed and negative with exception of: As noted in the HPI   ALLERGIES: Allergies  Allergen Reactions   Penicillins Other (See Comments)    syncope    HOME MEDICATIONS: Outpatient Medications Prior to Visit  Medication Sig Dispense Refill   amLODipine (NORVASC) 5 MG tablet Take 5 mg by mouth daily.     apixaban (ELIQUIS) 5 MG TABS tablet Take 1 tablet (5 mg total) by mouth 2 (two) times daily.  180 tablet 1   Ascorbic Acid (VITAMIN C PO) Take 1 tablet by mouth daily. Not sure the dose     atorvastatin (LIPITOR) 40 MG tablet Take 1 tablet (40 mg total) by mouth daily. 90 tablet 3   diltiazem (CARDIZEM) 30 MG tablet Take 1 tablet every 4 hours AS NEEDED for heart rate >100 30 tablet 1   ergocalciferol (VITAMIN D2) 1.25 MG (50000 UT) capsule 1 capsule Orally once a week x 8 weeks     flecainide (TAMBOCOR) 100 MG tablet Take 1 tablet (100 mg total) by mouth 2 (two)  times daily. 180 tablet 2   losartan (COZAAR) 100 MG tablet Take 100 mg by mouth daily.     metoprolol succinate (TOPROL-XL) 25 MG 24 hr tablet TAKE 1 TABLET (25 MG) BY MOUTH IN THE MORNING AND AT BEDTIME 180 tablet 1   Multiple Vitamins-Minerals (MULTI FOR HIM 50+) TABS as directed Orally     pantoprazole (PROTONIX) 40 MG tablet Take 40 mg by mouth as needed.      psyllium (HYDROCIL/METAMUCIL) 95 % PACK Take 1 packet by mouth daily.     zinc gluconate 50 MG tablet Take 50 mg by mouth daily.     apixaban (ELIQUIS) 5 MG TABS tablet NEEDS LABS FOR Eliquis Refills, come to office.  Take 1 tablet two times Daily 30 tablet 0   diclofenac sodium (VOLTAREN) 1 % GEL Apply 1 application  topically daily as needed for pain. (Patient not taking: Reported on 11/23/2022)     famotidine (PEPCID) 10 MG tablet Take 10 mg by mouth as needed.      No facility-administered medications prior to visit.    PAST MEDICAL HISTORY: Past Medical History:  Diagnosis Date   Anemia    a. Mild per pt - previously on iron but did not tolerate.   Chronic neck pain 06/27/2017   Diabetes mellitus without complication (HCC)    borderline   Erectile dysfunction    Former consumption of alcohol    a. Abstinent as of 03/2013.   Hyperlipidemia    Hypertension    Left atrial enlargement    Mild aortic insufficiency    PAF (paroxysmal atrial fibrillation) (HCC)    SVT (supraventricular tachycardia) (HCC)    Vitamin D deficiency     PAST SURGICAL HISTORY: Past Surgical History:  Procedure Laterality Date   CIRCUMCISION     at 73yrs old   TONSILLECTOMY     WISDOM TOOTH EXTRACTION      FAMILY HISTORY: Family History  Problem Relation Age of Onset   Diabetes Mellitus II Brother    Asthma Brother    CAD Neg Hx    Stroke Neg Hx    Heart attack Neg Hx     SOCIAL HISTORY: Social History   Socioeconomic History   Marital status: Single    Spouse name: Not on file   Number of children: Not on file   Years of  education: 12   Highest education level: Not on file  Occupational History   Occupation: Retired  Tobacco Use   Smoking status: Never   Smokeless tobacco: Never  Vaping Use   Vaping status: Never Used  Substance and Sexual Activity   Alcohol use: No    Alcohol/week: 0.0 standard drinks of alcohol   Drug use: No   Sexual activity: Not on file  Other Topics Concern   Not on file  Social History Narrative   Pt lives in [place] alone.  Retired.   Caffeine use: none   Right handed    Social Drivers of Corporate investment banker Strain: Not on file  Food Insecurity: Not on file  Transportation Needs: Not on file  Physical Activity: Not on file  Stress: Not on file  Social Connections: Not on file  Intimate Partner Violence: Not At Risk (10/08/2021)   [facility]    Threatened: Not on file    Insulted: Not on file    Physically Hurt : Not on file    Scream: Not on file   PHYSICAL EXAM  GENERAL EXAM/CONSTITUTIONAL: Vitals:  Vitals:   04/10/23 0852 04/10/23 0903  BP: (!) 149/75 130/70  Pulse: (!) 132   Weight: 259 lb 8 oz (117.7 kg)   Height: 6\' 3"  (1.905 m)    Body mass index is 32.44 kg/m. Wt Readings from Last 3 Encounters:  04/10/23 259 lb 8 oz (117.7 kg)  11/23/22 257 lb (116.6 kg)  04/13/22 263 lb 12.8 oz (119.7 kg)   Patient is in no distress; well developed, nourished and groomed; neck is supple  MUSCULOSKELETAL: Gait, strength, tone, movements noted in Neurologic exam below  NEUROLOGIC: MENTAL STATUS:      No data to display            04/10/2023    8:55 AM 04/07/2022    8:54 AM  Montreal Cognitive Assessment   Visuospatial/ Executive (0/5) 3 4  Naming (0/3) 3 3  Attention: Read list of digits (0/2) 1 2  Attention: Read list of letters (0/1) 1 1  Attention: Serial 7 subtraction starting at 100 (0/3) 2 3  Language: Repeat phrase (0/2) 1 2  Language : Fluency (0/1) 0 0  Abstraction (0/2) 2 2   Delayed Recall (0/5) 2 2  Orientation (0/6) 6 6  Total 21 25  Adjusted Score (based on education)  25    CRANIAL NERVE:  2nd, 3rd, 4th, 6th- visual fields full to confrontation, extraocular muscles intact, no nystagmus 5th - facial sensation symmetric 7th - facial strength symmetric 8th - hearing intact 9th - palate elevates symmetrically, uvula midline 11th - shoulder shrug symmetric 12th - tongue protrusion midline  MOTOR:  normal bulk and tone, full strength in the BUE, BLE  SENSORY:  normal and symmetric to light touch  COORDINATION:  finger-nose-finger, fine finger movements normal  GAIT/STATION:  normal   DIAGNOSTIC DATA (LABS, IMAGING, TESTING) - I reviewed patient records, labs, notes, testing and imaging myself where available.  Lab Results  Component Value Date   WBC 6.9 11/19/2020   HGB 13.0 11/19/2020   HCT 38.8 11/19/2020   MCV 86 11/19/2020   PLT 239 11/19/2020      Component Value Date/Time   NA 141 12/16/2020 1006   K 3.8 12/16/2020 1006   CL 109 (H) 12/16/2020 1006   CO2 20 12/16/2020 1006   GLUCOSE 106 (H) 12/16/2020 1006   GLUCOSE 116 (H) 03/11/2019 1840   BUN 17 12/16/2020 1006   CREATININE 1.08 12/16/2020 1006   CALCIUM 9.1 12/16/2020 1006   PROT 7.0 11/19/2020 1019   ALBUMIN 4.3 11/19/2020 1019   AST 13 11/19/2020 1019   ALT 10 11/19/2020 1019   ALKPHOS 81 11/19/2020 1019   BILITOT 0.5 11/19/2020 1019   GFRNONAA >60 03/11/2019 1817   GFRAA >60 03/11/2019 1817   Lab Results  Component Value Date   CHOL 144 11/19/2020   HDL 33 (L) 11/19/2020  LDLCALC 88 11/19/2020   LDLDIRECT 91 11/19/2020   TRIG 130 11/19/2020   CHOLHDL 4.4 11/19/2020   Lab Results  Component Value Date   HGBA1C 5.9 (H) 04/18/2013   Lab Results  Component Value Date   VITAMINB12 465 04/07/2022   Lab Results  Component Value Date   TSH 1.730 04/07/2022   ATN profile with Low B42/40 ration and normal p-tau which might be consistent with Alzheimer  disease biomarkers.   MRI Brain 04/14/2022 -Mild to moderate perisylvian and mesial temporal atrophy.  Mild ventriculomegaly on ex vacuo basis. -Mild chronic small vessel ischemic disease. -No acute findings.  ASSESSMENT AND PLAN  73 y.o. year old male with history of atrial fibrillation, hypertension, hyperlipidemia who is presenting for follow-up for his mild cognitive impairment.  His ATN profile showed a low beta 42/40 ratio and a normal PT and his MRI brain show moderate temporal region atrophy.  Today he scored a 21 out of 30 on the MoCA indicative of impairment, at last visit his score was 25.  He remains independent in all activities of daily living.  Plan will be to start patient on Aricept 5 mg nightly for his mild cognitive impairment and to refer him for a formal neuropsychological testing.  I will see him in 1 year for follow-up or sooner if worse.   1. Mild cognitive impairment     Patient Instructions  Start Aricept 5 mg nightly,  side effect of medication including vivid dream, dizziness and diarrhea discussed with patient Continue your other medications Referral to formal neuropsychological testing Follow-up in 1 year for or sooner if worse.  There are well-accepted and sensible ways to reduce risk for Alzheimers disease and other degenerative brain disorders .  Exercise Daily Walk A daily 20 minute walk should be part of your routine. Disease related apathy can be a significant roadblock to exercise and the only way to overcome this is to make it a daily routine and perhaps have a reward at the end (something your loved one loves to eat or drink perhaps) or a personal trainer coming to the home can also be very useful. Most importantly, the patient is much more likely to exercise if the caregiver / spouse does it with him/her. In general a structured, repetitive schedule is best.  General Health: Any diseases which effect your body will effect your brain such as a pneumonia,  urinary infection, blood clot, heart attack or stroke. Keep contact with your primary care doctor for regular follow ups.  Sleep. A good nights sleep is healthy for the brain. Seven hours is recommended. If you have insomnia or poor sleep habits we can give you some instructions. If you have sleep apnea wear your mask.  Diet: Eating a heart healthy diet is also a good idea; fish and poultry instead of red meat, nuts (mostly non-peanuts), vegetables, fruits, olive oil or canola oil (instead of butter), minimal salt (use other spices to flavor foods), whole grain rice, bread, cereal and pasta and wine in moderation.Research is now showing that the MIND diet, which is a combination of The Mediterranean diet and the DASH diet, is beneficial for cognitive processing and longevity. Information about this diet can be found in The MIND Diet, a book by Alonna Minium, MS, RDN, and online at [employer]Wild[employer].es  Finances, Power of [address] and Advance Directives: You should consider putting legal safeguards in place with regard to financial and medical decision making. While the spouse always has power of attorney  for medical and financial issues in the absence of any form, you should consider what you want in case the spouse / caregiver is no longer around or capable of making decisions.     Orders Placed This Encounter  Procedures   Ambulatory referral to Neuropsychology    Meds ordered this encounter  Medications   donepezil (ARICEPT) 5 MG tablet    Sig: Take 1 tablet (5 mg total) by mouth at bedtime.    Dispense:  30 tablet    Refill:  11    Return in about 1 year (around 04/09/2024).   Windell Norfolk, MD 04/10/2023, 9:41 AM  [facility] [address], [address] [place], [place] [address] [phone]

## 2023-04-10 NOTE — Patient Instructions (Signed)
Start Aricept 5 mg nightly,  side effect of medication including vivid dream, dizziness and diarrhea discussed with patient Continue your other medications Referral to formal neuropsychological testing Follow-up in 1 year for or sooner if worse.  There are well-accepted and sensible ways to reduce risk for Alzheimers disease and other degenerative brain disorders .  Exercise Daily Walk A daily 20 minute walk should be part of your routine. Disease related apathy can be a significant roadblock to exercise and the only way to overcome this is to make it a daily routine and perhaps have a reward at the end (something your loved one loves to eat or drink perhaps) or a personal trainer coming to the home can also be very useful. Most importantly, the patient is much more likely to exercise if the caregiver / spouse does it with him/her. In general a structured, repetitive schedule is best.  General Health: Any diseases which effect your body will effect your brain such as a pneumonia, urinary infection, blood clot, heart attack or stroke. Keep contact with your primary care doctor for regular follow ups.  Sleep. A good nights sleep is healthy for the brain. Seven hours is recommended. If you have insomnia or poor sleep habits we can give you some instructions. If you have sleep apnea wear your mask.  Diet: Eating a heart healthy diet is also a good idea; fish and poultry instead of red meat, nuts (mostly non-peanuts), vegetables, fruits, olive oil or canola oil (instead of butter), minimal salt (use other spices to flavor foods), whole grain rice, bread, cereal and pasta and wine in moderation.Research is now showing that the MIND diet, which is a combination of The Mediterranean diet and the DASH diet, is beneficial for cognitive processing and longevity. Information about this diet can be found in The MIND Diet, a book by Alonna Minium, MS, RDN, and online at  [employer]  Finances, Power of [address] and Advance Directives: You should consider putting legal safeguards in place with regard to financial and medical decision making. While the spouse always has power of attorney for medical and financial issues in the absence of any form, you should consider what you want in case the spouse / caregiver is no longer around or capable of making decisions.

## 2023-04-11 ENCOUNTER — Other Ambulatory Visit (HOSPITAL_COMMUNITY): Payer: Self-pay

## 2023-04-11 ENCOUNTER — Other Ambulatory Visit (HOSPITAL_COMMUNITY): Payer: Self-pay | Admitting: *Deleted

## 2023-04-11 DIAGNOSIS — I480 Paroxysmal atrial fibrillation: Secondary | ICD-10-CM

## 2023-04-11 MED ORDER — METOPROLOL SUCCINATE ER 25 MG PO TB24
25.0000 mg | ORAL_TABLET | Freq: Two times a day (BID) | ORAL | 1 refills | Status: DC
Start: 2023-04-11 — End: 2023-09-28

## 2023-04-11 MED ORDER — APIXABAN 5 MG PO TABS: 5.0000 mg | ORAL_TABLET | Freq: Two times a day (BID) | ORAL | Status: DC

## 2023-04-12 ENCOUNTER — Other Ambulatory Visit (HOSPITAL_COMMUNITY): Payer: Self-pay

## 2023-04-12 DIAGNOSIS — I480 Paroxysmal atrial fibrillation: Secondary | ICD-10-CM

## 2023-04-12 MED ORDER — APIXABAN 5 MG PO TABS: 5.0000 mg | ORAL_TABLET | Freq: Two times a day (BID) | ORAL | Status: DC

## 2023-05-18 ENCOUNTER — Ambulatory Visit (HOSPITAL_COMMUNITY)
Admission: RE | Admit: 2023-05-18 | Discharge: 2023-05-18 | Disposition: A | Discharge status: Home / Self Care | Source: Ambulatory Visit | Attending: Internal Medicine | Admitting: Internal Medicine

## 2023-05-18 VITALS — BP 122/72 | HR 62 | Ht 75.0 in | Wt 259.0 lb

## 2023-05-18 DIAGNOSIS — D6869 Other thrombophilia: Secondary | ICD-10-CM | POA: Diagnosis not present

## 2023-05-18 DIAGNOSIS — Z7901 Long term (current) use of anticoagulants: Secondary | ICD-10-CM | POA: Insufficient documentation

## 2023-05-18 DIAGNOSIS — D649 Anemia, unspecified: Secondary | ICD-10-CM | POA: Insufficient documentation

## 2023-05-18 DIAGNOSIS — I471 Supraventricular tachycardia, unspecified: Secondary | ICD-10-CM | POA: Diagnosis not present

## 2023-05-18 DIAGNOSIS — R002 Palpitations: Secondary | ICD-10-CM | POA: Diagnosis not present

## 2023-05-18 DIAGNOSIS — Z5181 Encounter for therapeutic drug level monitoring: Secondary | ICD-10-CM | POA: Diagnosis not present

## 2023-05-18 DIAGNOSIS — I10 Essential (primary) hypertension: Secondary | ICD-10-CM | POA: Diagnosis not present

## 2023-05-18 DIAGNOSIS — Z79899 Other long term (current) drug therapy: Secondary | ICD-10-CM | POA: Insufficient documentation

## 2023-05-18 DIAGNOSIS — Z8616 Personal history of COVID-19: Secondary | ICD-10-CM | POA: Insufficient documentation

## 2023-05-18 DIAGNOSIS — I480 Paroxysmal atrial fibrillation: Secondary | ICD-10-CM | POA: Insufficient documentation

## 2023-05-18 NOTE — Progress Notes (Signed)
 Patient ID: Andrew Perkins, male   DOB: 16-Mar-1950, 73 y.o.   MRN: [id number]     Patient ID: Andrew Perkins, male   DOB: 11-Apr-1950, 73 y.o.   MRN: [id number]   Date:  05/18/2023   PCP:  Farris Has, MD  Cardiologist:  Dr Eden Emms Primary Electrophysiologist: Dr. Lalla Brothers     f/u flecainide  use   History of Present Illness: Andrew Perkins is a 73 y.o. male who presents today for f/u in the afib clinic.   The patient reports initially being diagnosed with atrial fibrillation 2/15 after presenting with tachypalpitations.  In retrospect, he thinks that he has had afib for 4-5 years.  Episodes were initially short, lasting several minutes.  At that time, he attributed them to "anxiety".  He developed sustained tachycardia 2/15 and he presented to Dr Vincente Liberty office with documented afib on ekg.  He was placed on eliquis and metoprolol.  He reports increasing frequency and duration of atrial fibrillation.  Several months ago, he was given flecainide as a "pill in pocket" approach.  He has required flecainide several times since then.  He had an event monitor placed 2/16 which has documented a more regular narrow complex SVT.  He has not tried daily AAD therapy though  He does take metoprolol twice daily.  He has very frequent palpitations which he finds worrisome.  He saw Dr. Johney Frame in consultation 4/16 and was given options to treat afib. He wanted to pursue AAD and avoid ablation.  He was started on flecainide 100 mg bid . He appears to be tolerating without adverse effects and most importantly has very little afib burden on drug. He noticed afib for one hour last Sunday and this is the most afib that he has noticed.  He reports  negative sleep study. He does try to walk daily. Remains on DOAC with chadsvasc score of 2. He states that he does have mild anemia and takes iron. He has had a colonoscopy in the last year and did have polys which were removed.  F/u clinic 07/1515, continue on flecainide and is doing  will with litle afib burden. He report that he had 3 episode in early April, longest epiosde was 3 hours at v rate of 102 bpm, next episode 3 days later at 115 bpm, lasted one hour, last epiosde lasted 5 mins, at 99 bpm. Has not any further episodes since that 8th of April. He is still satisfied with afib management. He can now continue with what he is doing in afib vrs getting lightheaded and having to lay down.  Continues on eliquis, no bleeding issues.  F/u in afib clinic 12/08/15 and reports small afib burden. He is till happy with afib management. He is walking on a regular basis. Afib episodes can last 5 mins to an hour. No bleeding issues with eliquis.  F/u in afib clinc 04/07/16. He reports increase in afib burden over the last week because he traveled out of town and was out of his usual routine with diet and sleeping. He still is happy with flecainide and wants to continue with drug.   Return to afib clinic 7/18. Continues with flecainide and reports very little afib burden. Feels well. Continues with eliquis 5 mg bid, no bleeding issues currently.  F/u 10/25/17,he reports very little afib burden Is happy with current management with flecainide.. No bleeding issues. Had labs drawn with PCP yesterday.  F/u afib clinic 11/15/18. He is still happy with his afib burden on flecainide.  He has 1-2 outbreaks a month, lasting around one hour. He does not have to stop what he is doing when he is in afib. He usually knows only  because of higher heart rates on his watch.   F/u in afib clinic, 11/18/19. He in Sinus brady at 57 bpm, continues on flecainide and BB. He guesstimates around 10 afib episodes over the last year. Most episodes are short, 10-15 mins and his longest episode was around 6 hours. He  took an extra flecainide and BB and he converted. Continues  on eliquis 5 mg bid for a CHA2DS2VASc score of 2.   F/u in afib clinic, 09/01/20. He reports that he was visiting his daughter in [place] middle of  June, he had chills with elevated heart rate. He went to the ER there and tested COVID positive. He was in sinus tachycardia, EKG results reviewed in Epic. [name] He had elevated HR and chills for another 24 hours and then his symptoms became minimal. He has noted some short elevations of heart rate since then. Apple strips reviewed and appear to show sinus tach. He will occassionally will take a prn metoprolol.   F/u in the afib clinic, 09/08/21. He reports since July 5th he has had 3 afib episodes usually lasting around 4-6 hours. He will  take an extra metoprolol. No specific trigger found. No alcohol use. He is in SR today.  F/u, 10/19/21, from last visit for increased afib burden. For the last month, he has been in rhythm without any afib. He is happy to stay with flecainide for now, does not see a reason to change.   F/u in afib clinic, 04/13/22. He reports very rare elevated heart beat episode that is not sustained. He saw Dr. Lalla Brothers  in December 2023 and a monitor that Dr. Eden Emms placed was reviewed and it was noted some episodes of AT and PAC's. He changed his BB to succinate  and continued with flecainide.   F/u in Afib clinic, 05/18/23. He is here for flecainide surveillance. He is currently in NSR. He takes flecainide 100 mg BID and Toprol 25 mg daily. No Afib confirmed with [employer]. No missed doses of Eliquis 5 mg BID.   Today, he denies symptoms of chest pain, shortness of breath, orthopnea, PND, lower extremity edema, claudication, dizziness, presyncope, syncope, bleeding, or neurologic sequela. The patient is tolerating medications without difficulties and is otherwise without complaint today.    Past Medical History:  Diagnosis Date   Anemia    a. Mild per pt - previously on iron but did not tolerate.   Chronic neck pain 06/27/2017   Diabetes mellitus without complication (HCC)    borderline   Erectile dysfunction    Former consumption of alcohol    a. Abstinent as of 03/2013.    Hyperlipidemia    Hypertension    Left atrial enlargement    Mild aortic insufficiency    PAF (paroxysmal atrial fibrillation) (HCC)    SVT (supraventricular tachycardia) (HCC)    Vitamin D deficiency    Past Surgical History:  Procedure Laterality Date   CIRCUMCISION     at 73yrs old   TONSILLECTOMY     WISDOM TOOTH EXTRACTION       Current Outpatient Medications  Medication Sig Dispense Refill   amLODipine (NORVASC) 5 MG tablet Take 5 mg by mouth daily.     apixaban (ELIQUIS) 5 MG TABS tablet Take 1 tablet (5 mg total) by mouth 2 (two) times daily. 14  tablet    Ascorbic Acid (VITAMIN C PO) Take 1 tablet by mouth daily. Not sure the dose     atorvastatin (LIPITOR) 40 MG tablet Take 1 tablet (40 mg total) by mouth daily. 90 tablet 3   diltiazem (CARDIZEM) 30 MG tablet Take 1 tablet every 4 hours AS NEEDED for heart rate >100 30 tablet 1   donepezil (ARICEPT) 5 MG tablet Take 1 tablet (5 mg total) by mouth at bedtime. 30 tablet 11   ergocalciferol (VITAMIN D2) 1.25 MG (50000 UT) capsule 1 capsule Orally once a week x 8 weeks     flecainide (TAMBOCOR) 100 MG tablet Take 1 tablet (100 mg total) by mouth 2 (two) times daily. 180 tablet 2   losartan (COZAAR) 100 MG tablet Take 100 mg by mouth daily.     metoprolol succinate (TOPROL-XL) 25 MG 24 hr tablet Take 1 tablet (25 mg total) by mouth 2 (two) times daily. 180 tablet 1   Multiple Vitamins-Minerals (MULTI FOR HIM 50+) TABS Take 1 tablet by mouth every morning.     pantoprazole (PROTONIX) 40 MG tablet Take 40 mg by mouth as needed.      psyllium (HYDROCIL/METAMUCIL) 95 % PACK Take 1 packet by mouth daily.     zinc gluconate 50 MG tablet Take 50 mg by mouth daily.     No current facility-administered medications for this encounter.    Allergies:   Penicillins   ROS:  Please see the history of present illness.   All other systems are reviewed and negative.    PHYSICAL EXAM: VS:  BP 122/72   Pulse 62   Ht 6\' 3"  (1.905 m)   Wt  117.5 kg   BMI 32.37 kg/m  , BMI Body mass index is 32.37 kg/m.  GEN- The patient is well appearing, alert and oriented x 3 today.   Neck - no JVD or carotid bruit noted Lungs- Clear to ausculation bilaterally, normal work of breathing Heart- Regular rate and rhythm, no murmurs, rubs or gallops, PMI not laterally displaced Extremities- no clubbing, cyanosis, or edema Skin - no rash or ecchymosis noted   EKG:   Vent. rate 62 BPM PR interval 164 ms QRS duration 102 ms QT/QTcB 454/460 ms P-R-T axes 16 -17 -12 Normal sinus rhythm Moderate voltage criteria for LVH, may be normal variant ( R in aVL , Cornell product ) Nonspecific ST and T wave abnormality Abnormal ECG When compared with ECG of 13-Apr-2022 09:41, PREVIOUS ECG IS PRESENT  ECHO 12/29/21:  1. Left ventricular ejection fraction, by estimation, is 60 to 65%. The  left ventricle has normal function. The left ventricle has no regional  wall motion abnormalities. There is moderate concentric left ventricular  hypertrophy. Left ventricular  diastolic parameters are consistent with Grade I diastolic dysfunction  (impaired relaxation).   2. Right ventricular systolic function is normal. The right ventricular  size is normal. There is normal pulmonary artery systolic pressure.   3. The mitral valve is normal in structure. Mild mitral valve  regurgitation. No evidence of mitral stenosis.   4. The aortic valve is tricuspid. Aortic valve regurgitation is mild. No  aortic stenosis is present.   5. Aortic dilatation noted. There is mild dilatation of the aortic root,  measuring 40 mm.   6. The inferior vena cava is normal in size with greater than 50%  respiratory variability, suggesting right atrial pressure of 3 mmHg.     Wt Readings from Last 3 Encounters:  05/18/23 117.5 kg  04/10/23 117.7 kg  11/23/22 116.6 kg     CHA2DS2-VASc Score = 3  The patient's score is based upon: CHF History: 0 HTN History: 1 Diabetes  History: 0 Stroke History: 0 Vascular Disease History: 0 Age Score: 2 Gender Score: 0       ASSESSMENT AND PLAN:  Paroxysmal atrial fibrillation/ SVT/AT/PAC's  He is currently in NSR.  High risk medication monitoring (ICD10: [id number]) Patient requires ongoing monitoring for anti-arrhythmic medication which has the potential to cause life threatening arrhythmias or AV block. ECG intervals are stable. Continue flecainide 100 mg BID. Continue Toprol 25 mg daily.  Secondary hypercoagulable state due to Afib Continue Eliquis 5 mg BID without interruption.   Hypertension Stable today.    Follow up 6 months for flecainide surveillance.   Justin Mend, PA-C Afib Clinic [facility] [address] [place], [place] [address] [phone]

## 2023-05-24 DIAGNOSIS — R7303 Prediabetes: Secondary | ICD-10-CM | POA: Diagnosis not present

## 2023-05-24 DIAGNOSIS — I10 Essential (primary) hypertension: Secondary | ICD-10-CM | POA: Diagnosis not present

## 2023-05-24 DIAGNOSIS — E66811 Obesity, class 1: Secondary | ICD-10-CM | POA: Diagnosis not present

## 2023-05-24 DIAGNOSIS — E559 Vitamin D deficiency, unspecified: Secondary | ICD-10-CM | POA: Diagnosis not present

## 2023-05-24 DIAGNOSIS — Z6831 Body mass index (BMI) 31.0-31.9, adult: Secondary | ICD-10-CM | POA: Diagnosis not present

## 2023-05-24 DIAGNOSIS — I700 Atherosclerosis of aorta: Secondary | ICD-10-CM | POA: Diagnosis not present

## 2023-05-24 DIAGNOSIS — E785 Hyperlipidemia, unspecified: Secondary | ICD-10-CM | POA: Diagnosis not present

## 2023-06-13 DIAGNOSIS — G3184 Mild cognitive impairment, so stated: Secondary | ICD-10-CM | POA: Diagnosis not present

## 2023-06-29 DIAGNOSIS — G3184 Mild cognitive impairment, so stated: Secondary | ICD-10-CM | POA: Diagnosis not present

## 2023-07-07 ENCOUNTER — Telehealth: Payer: Self-pay | Admitting: Neurology

## 2023-07-07 NOTE — Telephone Encounter (Signed)
 Pt reports he has stop taking donepezil  (ARICEPT ) 5 MG tablet  because it makes him sick,please call pt to discuss.

## 2023-07-07 NOTE — Telephone Encounter (Signed)
 Spoke w/Pt regarding donepezil . Pt stated he only took med for 4 weeks and has stopped taking it about two weeks ago because he had diarrhea. Explained sometimes diarrhea occurs and recommended Pt take med with food as that sometimes helps. Pt stated he doesn't eat late or before bed and he had been taking medication just before going to bed. Informed Pt to try taking med after last meal of the day to see if that helps with the diarrhea and try for 1-2 weeks. Asked Pt to call the office if continues to have issues with med after taking w/food. Pt stated understanding and thankful for call back.

## 2023-07-27 DIAGNOSIS — R7303 Prediabetes: Secondary | ICD-10-CM | POA: Diagnosis not present

## 2023-07-27 DIAGNOSIS — Z6831 Body mass index (BMI) 31.0-31.9, adult: Secondary | ICD-10-CM | POA: Diagnosis not present

## 2023-07-27 DIAGNOSIS — E785 Hyperlipidemia, unspecified: Secondary | ICD-10-CM | POA: Diagnosis not present

## 2023-07-27 DIAGNOSIS — E66811 Obesity, class 1: Secondary | ICD-10-CM | POA: Diagnosis not present

## 2023-07-27 DIAGNOSIS — E559 Vitamin D deficiency, unspecified: Secondary | ICD-10-CM | POA: Diagnosis not present

## 2023-07-27 DIAGNOSIS — I10 Essential (primary) hypertension: Secondary | ICD-10-CM | POA: Diagnosis not present

## 2023-08-21 DIAGNOSIS — I4891 Unspecified atrial fibrillation: Secondary | ICD-10-CM | POA: Diagnosis not present

## 2023-08-21 DIAGNOSIS — I10 Essential (primary) hypertension: Secondary | ICD-10-CM | POA: Diagnosis not present

## 2023-08-30 DIAGNOSIS — L309 Dermatitis, unspecified: Secondary | ICD-10-CM | POA: Diagnosis not present

## 2023-08-30 DIAGNOSIS — I10 Essential (primary) hypertension: Secondary | ICD-10-CM | POA: Diagnosis not present

## 2023-08-30 DIAGNOSIS — I4891 Unspecified atrial fibrillation: Secondary | ICD-10-CM | POA: Diagnosis not present

## 2023-08-30 DIAGNOSIS — E559 Vitamin D deficiency, unspecified: Secondary | ICD-10-CM | POA: Diagnosis not present

## 2023-08-30 DIAGNOSIS — D649 Anemia, unspecified: Secondary | ICD-10-CM | POA: Diagnosis not present

## 2023-08-30 DIAGNOSIS — R7309 Other abnormal glucose: Secondary | ICD-10-CM | POA: Diagnosis not present

## 2023-08-30 DIAGNOSIS — Z1211 Encounter for screening for malignant neoplasm of colon: Secondary | ICD-10-CM | POA: Diagnosis not present

## 2023-08-31 ENCOUNTER — Other Ambulatory Visit: Payer: Self-pay

## 2023-08-31 DIAGNOSIS — I480 Paroxysmal atrial fibrillation: Secondary | ICD-10-CM

## 2023-08-31 MED ORDER — APIXABAN 5 MG PO TABS: 5.0000 mg | ORAL_TABLET | Freq: Two times a day (BID) | ORAL | 1 refills | Status: DC

## 2023-08-31 NOTE — Telephone Encounter (Signed)
 Prescription refill request for Eliquis  received. Indication: Afib  Last office visit: 05/18/23 Sam)  Scr: overdue Age: 73 Weight: 117.5kg  Labs overdue. Called PCP and requested updated labs.

## 2023-08-31 NOTE — Telephone Encounter (Signed)
 Labs received from PCP. Scr 1.06 on 03/01/23. Appropriate dose. Refill sent.

## 2023-09-14 DIAGNOSIS — Z1211 Encounter for screening for malignant neoplasm of colon: Secondary | ICD-10-CM | POA: Diagnosis not present

## 2023-09-19 DIAGNOSIS — I10 Essential (primary) hypertension: Secondary | ICD-10-CM | POA: Diagnosis not present

## 2023-09-19 DIAGNOSIS — I4891 Unspecified atrial fibrillation: Secondary | ICD-10-CM | POA: Diagnosis not present

## 2023-09-21 DIAGNOSIS — I10 Essential (primary) hypertension: Secondary | ICD-10-CM | POA: Diagnosis not present

## 2023-09-21 DIAGNOSIS — E66811 Obesity, class 1: Secondary | ICD-10-CM | POA: Diagnosis not present

## 2023-09-21 DIAGNOSIS — E669 Obesity, unspecified: Secondary | ICD-10-CM | POA: Diagnosis not present

## 2023-09-21 DIAGNOSIS — E785 Hyperlipidemia, unspecified: Secondary | ICD-10-CM | POA: Diagnosis not present

## 2023-09-21 DIAGNOSIS — I4891 Unspecified atrial fibrillation: Secondary | ICD-10-CM | POA: Diagnosis not present

## 2023-09-28 ENCOUNTER — Other Ambulatory Visit (HOSPITAL_COMMUNITY): Payer: Self-pay | Admitting: *Deleted

## 2023-09-28 DIAGNOSIS — E785 Hyperlipidemia, unspecified: Secondary | ICD-10-CM | POA: Diagnosis not present

## 2023-09-28 DIAGNOSIS — R7303 Prediabetes: Secondary | ICD-10-CM | POA: Diagnosis not present

## 2023-09-28 DIAGNOSIS — E66811 Obesity, class 1: Secondary | ICD-10-CM | POA: Diagnosis not present

## 2023-09-28 DIAGNOSIS — E559 Vitamin D deficiency, unspecified: Secondary | ICD-10-CM | POA: Diagnosis not present

## 2023-09-28 DIAGNOSIS — I10 Essential (primary) hypertension: Secondary | ICD-10-CM | POA: Diagnosis not present

## 2023-09-28 DIAGNOSIS — Z6831 Body mass index (BMI) 31.0-31.9, adult: Secondary | ICD-10-CM | POA: Diagnosis not present

## 2023-09-28 MED ORDER — METOPROLOL SUCCINATE ER 25 MG PO TB24: 25.0000 mg | ORAL_TABLET | Freq: Two times a day (BID) | ORAL | 2 refills | Status: AC

## 2023-10-18 DIAGNOSIS — R933 Abnormal findings on diagnostic imaging of other parts of digestive tract: Secondary | ICD-10-CM | POA: Diagnosis not present

## 2023-10-18 DIAGNOSIS — K5900 Constipation, unspecified: Secondary | ICD-10-CM | POA: Diagnosis not present

## 2023-10-18 DIAGNOSIS — K219 Gastro-esophageal reflux disease without esophagitis: Secondary | ICD-10-CM | POA: Diagnosis not present

## 2023-10-18 DIAGNOSIS — R195 Other fecal abnormalities: Secondary | ICD-10-CM | POA: Diagnosis not present

## 2023-10-19 DIAGNOSIS — I10 Essential (primary) hypertension: Secondary | ICD-10-CM | POA: Diagnosis not present

## 2023-10-19 DIAGNOSIS — I4891 Unspecified atrial fibrillation: Secondary | ICD-10-CM | POA: Diagnosis not present

## 2023-10-22 DIAGNOSIS — E785 Hyperlipidemia, unspecified: Secondary | ICD-10-CM | POA: Diagnosis not present

## 2023-10-22 DIAGNOSIS — E669 Obesity, unspecified: Secondary | ICD-10-CM | POA: Diagnosis not present

## 2023-10-22 DIAGNOSIS — I10 Essential (primary) hypertension: Secondary | ICD-10-CM | POA: Diagnosis not present

## 2023-10-22 DIAGNOSIS — E66811 Obesity, class 1: Secondary | ICD-10-CM | POA: Diagnosis not present

## 2023-10-22 DIAGNOSIS — I4891 Unspecified atrial fibrillation: Secondary | ICD-10-CM | POA: Diagnosis not present

## 2023-10-24 ENCOUNTER — Telehealth: Payer: Self-pay

## 2023-10-24 ENCOUNTER — Telehealth (INDEPENDENT_AMBULATORY_CARE_PROVIDER_SITE_OTHER)

## 2023-10-24 DIAGNOSIS — Z01810 Encounter for preprocedural cardiovascular examination: Secondary | ICD-10-CM

## 2023-10-24 NOTE — Telephone Encounter (Signed)
   Pre-operative Risk Assessment    Patient Name: Andrew Perkins  DOB: [birth year] MRN: [id number]   Date of last office visit: 05/18/23 FAIRY HEINRICH, PA-C (AFI[facility]), 11/23/22 MAUDE EMMER, MD Date of next office visit: 11/16/23 FAIRY HEINRICH, PA-C [facility])   Request for Surgical Clearance    Procedure:  COLONOSCOPY  Date of Surgery:  Clearance 10/26/23                                Surgeon:  DR LAYLA LAH Surgeon's Group or Practice Name:  [facility] Phone number:  [phone] Fax number:  [phone]   Type of Clearance Requested:   - Medical  - Pharmacy:  Hold Apixaban  (Eliquis )     Type of Anesthesia:  PROPOFOL   Additional requests/questions:    SignedLucie DELENA Ku   10/24/2023, 10:17 AM

## 2023-10-24 NOTE — Telephone Encounter (Signed)
 Patient with diagnosis of A Fib on Eliquis  for anticoagulation.    Procedure: colonoscopy Date of procedure: 10/26/23   CHA2DS2-VASc Score = 3  This indicates a 3.2% annual risk of stroke. The patient's score is based upon: CHF History: 0 HTN History: 1 Diabetes History: 0 Stroke History: 0 Vascular Disease History: 0 Age Score: 2 Gender Score: 0     CrCl 100 ml/min Platelet count No CBC available  Patient has not had an Afib/aflutter ablation within the last 3 months or DCCV within the last 30 days   Per office protocol, patient can hold Elqiuis for 2 days prior to procedure.     **This guidance is not considered finalized until pre-operative APP has relayed final recommendations.**

## 2023-10-24 NOTE — Progress Notes (Signed)
 Virtual Visit via Telephone Note   Because of [name] co-morbid illnesses, he is at least at moderate risk for complications without adequate follow up.  This format is felt to be most appropriate for this patient at this time.  Due to technical limitations with video connection (technology), today's appointment will be conducted as an audio only telehealth visit, and Andrew Perkins verbally agreed to proceed in this manner.   All issues noted in this document were discussed and addressed.  No physical exam could be performed with this format.  Evaluation Performed:  Preoperative cardiovascular risk assessment _____________   Date:  10/24/2023   Patient ID:  Andrew Perkins, DOB 06/07/1950, MRN [id number] Patient Location:  Home Provider location:   Office  Primary Care Provider:  Kip Righter, MD Primary Cardiologist:  Maude Emmer, MD  Chief Complaint / Patient Profile   73 y.o. y/o male with a h/o paroxysmal atrial fibrillation, hyperlipidemia, hypertension who is pending colonoscopy and presents today for telephonic preoperative cardiovascular risk assessment.  History of Present Illness    Andrew Perkins is a 73 y.o. male who presents via audio/video conferencing for a telehealth visit today.  Pt was last seen in cardiology clinic on 11/23/2022 by Dr. Emmer.  At that time Andrew Perkins was doing well .  The patient is now pending procedure as outlined above. Since his last visit, he remains stable from a cardiac standpoint.  Today he denies chest pain, shortness of breath, lower extremity edema, fatigue, palpitations, melena, hematuria, hemoptysis, diaphoresis, weakness, presyncope, syncope, orthopnea, and PND.   Past Medical History    Past Medical History:  Diagnosis Date   Anemia    a. Mild per pt - previously on iron but did not tolerate.   Chronic neck pain 06/27/2017   Diabetes mellitus without complication (HCC)    borderline   Erectile dysfunction    Former consumption  of alcohol    a. Abstinent as of 03/2013.   Hyperlipidemia    Hypertension    Left atrial enlargement    Mild aortic insufficiency    PAF (paroxysmal atrial fibrillation) (HCC)    SVT (supraventricular tachycardia) (HCC)    Vitamin D deficiency    Past Surgical History:  Procedure Laterality Date   CIRCUMCISION     at 73yrs old   TONSILLECTOMY     WISDOM TOOTH EXTRACTION      Allergies  Allergies  Allergen Reactions   Penicillins Other (See Comments)    syncope    Home Medications    Prior to Admission medications   Medication Sig Start Date End Date Taking? Authorizing Provider  amLODipine (NORVASC) 5 MG tablet Take 5 mg by mouth daily.    [provider]  apixaban  (ELIQUIS ) 5 MG TABS tablet Take 1 tablet (5 mg total) by mouth 2 (two) times daily. 08/31/23   Nishan, Peter C, MD  Ascorbic Acid (VITAMIN C PO) Take 1 tablet by mouth daily. Not sure the dose    [provider]  atorvastatin  (LIPITOR) 40 MG tablet Take 1 tablet (40 mg total) by mouth daily. 11/20/20   Dunn, Dayna N, PA-C  diltiazem  (CARDIZEM ) 30 MG tablet Take 1 tablet every 4 hours AS NEEDED for heart rate >100 09/08/21   Dow Arland BROCKS, NP  donepezil  (ARICEPT ) 5 MG tablet Take 1 tablet (5 mg total) by mouth at bedtime. 04/10/23   Camara, Amadou, MD  ergocalciferol (VITAMIN D2) 1.25 MG (50000 UT) capsule 1 capsule Orally once a  week x 8 weeks 02/18/22   [provider]  flecainide  (TAMBOCOR ) 100 MG tablet Take 1 tablet (100 mg total) by mouth 2 (two) times daily. 03/09/23   Fenton, Clint R, PA  losartan  (COZAAR ) 100 MG tablet Take 100 mg by mouth daily.    [provider]  metoprolol  succinate (TOPROL -XL) 25 MG 24 hr tablet Take 1 tablet (25 mg total) by mouth 2 (two) times daily. 09/28/23   Terra Fairy PARAS, PA-C  Multiple Vitamins-Minerals (MULTI FOR HIM 50+) TABS Take 1 tablet by mouth every morning.    [provider]  pantoprazole (PROTONIX) 40 MG tablet Take 40 mg by  mouth as needed.     [provider]  psyllium (HYDROCIL/METAMUCIL) 95 % PACK Take 1 packet by mouth daily.    [provider]  zinc gluconate 50 MG tablet Take 50 mg by mouth daily.    [provider]    Physical Exam    Vital Signs:  Andrew Perkins does not have vital signs available for review today.  Given telephonic nature of communication, physical exam is limited. AAOx3. NAD. Normal affect.  Speech and respirations are unlabored.  Accessory Clinical Findings    None  Assessment & Plan    1.  Preoperative Cardiovascular Risk Assessment: COLONOSCOPY   Date of Surgery:  Clearance 10/26/23                                  Surgeon:  DR LAYLA LAH Surgeon's Group or Practice Name:  [facility] Phone number:  [phone] Fax number:  [phone]      Primary Cardiologist: Maude Emmer, MD  Chart reviewed as part of pre-operative protocol coverage. Given past medical history and time since last visit, based on ACC/AHA guidelines, Andrew Perkins would be at acceptable risk for the planned procedure without further cardiovascular testing.   Patient was advised that if he develops new symptoms prior to surgery to contact our office to arrange a follow-up appointment.  He verbalized understanding.  Per office protocol, patient can hold Elqiuis for 2 days prior to procedure.   I will route this recommendation to the requesting party via Epic fax function and remove from pre-op pool.      Time:   Today, I have spent 5 minutes with the patient with telehealth technology discussing medical history, symptoms, and management plan.  I spent 10 minutes reviewing patient's past cardiac history and cardiac medications.    [name], NP  10/24/2023, 12:45 PM

## 2023-10-24 NOTE — Telephone Encounter (Signed)
   Name: Andrew Perkins  DOB: 09/27/1950  MRN: [id number]  Primary Cardiologist: Maude Emmer, MD   Preoperative team, please contact this patient and set up a phone call appointment for further preoperative risk assessment. Please obtain consent and complete medication review. Thank you for your help.  Preoperative team, patient has upcoming appointment with cardiology at the end of the month.  If colonoscopy is not urgent patient may follow-up as scheduled and postpone procedure.  I confirm that guidance regarding antiplatelet and oral anticoagulation therapy has been completed and, if necessary, noted below.  Patient has not had an Afib/aflutter ablation within the last 3 months or DCCV within the last 30 days     Per office protocol, patient can hold Elqiuis for 2 days prior to procedure.    I also confirmed the patient resides in the state of [place] . As per [facility] Medical Board telemedicine laws, the patient must reside in the state in which the provider is licensed.   [name], NP 10/24/2023, 11:42 AM [facility]

## 2023-10-24 NOTE — Telephone Encounter (Signed)
 Appointment scheduled for 10/24/23 @ 3pm. Call patient at [phone]. Appointment 9/4. Med req and consent are complete. Last dose of Eliquis  was on Satuday, per patient.

## 2023-10-26 DIAGNOSIS — R933 Abnormal findings on diagnostic imaging of other parts of digestive tract: Secondary | ICD-10-CM | POA: Diagnosis not present

## 2023-10-26 DIAGNOSIS — Q438 Other specified congenital malformations of intestine: Secondary | ICD-10-CM | POA: Diagnosis not present

## 2023-10-26 DIAGNOSIS — D122 Benign neoplasm of ascending colon: Secondary | ICD-10-CM | POA: Diagnosis not present

## 2023-10-26 DIAGNOSIS — R195 Other fecal abnormalities: Secondary | ICD-10-CM | POA: Diagnosis not present

## 2023-10-30 DIAGNOSIS — D122 Benign neoplasm of ascending colon: Secondary | ICD-10-CM | POA: Diagnosis not present

## 2023-11-03 ENCOUNTER — Observation Stay (HOSPITAL_COMMUNITY)
Admission: EM | Admit: 2023-11-03 | Discharge: 2023-11-05 | Disposition: A | Discharge status: Home / Self Care | Attending: Internal Medicine | Admitting: Internal Medicine

## 2023-11-03 ENCOUNTER — Other Ambulatory Visit: Payer: Self-pay

## 2023-11-03 ENCOUNTER — Encounter (HOSPITAL_COMMUNITY): Payer: Self-pay

## 2023-11-03 DIAGNOSIS — R944 Abnormal results of kidney function studies: Secondary | ICD-10-CM | POA: Insufficient documentation

## 2023-11-03 DIAGNOSIS — E66811 Obesity, class 1: Secondary | ICD-10-CM | POA: Insufficient documentation

## 2023-11-03 DIAGNOSIS — Z79899 Other long term (current) drug therapy: Secondary | ICD-10-CM | POA: Insufficient documentation

## 2023-11-03 DIAGNOSIS — K922 Gastrointestinal hemorrhage, unspecified: Principal | ICD-10-CM

## 2023-11-03 DIAGNOSIS — I4819 Other persistent atrial fibrillation: Secondary | ICD-10-CM | POA: Insufficient documentation

## 2023-11-03 DIAGNOSIS — N179 Acute kidney failure, unspecified: Secondary | ICD-10-CM | POA: Insufficient documentation

## 2023-11-03 DIAGNOSIS — R7303 Prediabetes: Secondary | ICD-10-CM | POA: Insufficient documentation

## 2023-11-03 DIAGNOSIS — Z7901 Long term (current) use of anticoagulants: Secondary | ICD-10-CM | POA: Insufficient documentation

## 2023-11-03 DIAGNOSIS — D649 Anemia, unspecified: Secondary | ICD-10-CM | POA: Diagnosis not present

## 2023-11-03 DIAGNOSIS — E785 Hyperlipidemia, unspecified: Secondary | ICD-10-CM | POA: Insufficient documentation

## 2023-11-03 DIAGNOSIS — M7989 Other specified soft tissue disorders: Secondary | ICD-10-CM | POA: Insufficient documentation

## 2023-11-03 DIAGNOSIS — K625 Hemorrhage of anus and rectum: Principal | ICD-10-CM | POA: Insufficient documentation

## 2023-11-03 DIAGNOSIS — I480 Paroxysmal atrial fibrillation: Secondary | ICD-10-CM

## 2023-11-03 DIAGNOSIS — M542 Cervicalgia: Secondary | ICD-10-CM | POA: Insufficient documentation

## 2023-11-03 DIAGNOSIS — I4891 Unspecified atrial fibrillation: Secondary | ICD-10-CM | POA: Diagnosis present

## 2023-11-03 DIAGNOSIS — Z6831 Body mass index (BMI) 31.0-31.9, adult: Secondary | ICD-10-CM | POA: Insufficient documentation

## 2023-11-03 DIAGNOSIS — G8929 Other chronic pain: Secondary | ICD-10-CM | POA: Diagnosis not present

## 2023-11-03 DIAGNOSIS — I10 Essential (primary) hypertension: Secondary | ICD-10-CM | POA: Insufficient documentation

## 2023-11-03 DIAGNOSIS — D62 Acute posthemorrhagic anemia: Secondary | ICD-10-CM | POA: Insufficient documentation

## 2023-11-03 LAB — CBC
HCT: 33 % — ABNORMAL LOW (ref 39.0–52.0)
Hemoglobin: 10.4 g/dL — ABNORMAL LOW (ref 13.0–17.0)
MCH: 29.1 pg (ref 26.0–34.0)
MCHC: 31.5 g/dL (ref 30.0–36.0)
MCV: 92.2 fL (ref 80.0–100.0)
Platelets: 225 K/uL (ref 150–400)
RBC: 3.58 MIL/uL — ABNORMAL LOW (ref 4.22–5.81)
RDW: 15.1 % (ref 11.5–15.5)
WBC: 8.5 K/uL (ref 4.0–10.5)
nRBC: 0 % (ref 0.0–0.2)

## 2023-11-03 LAB — COMPREHENSIVE METABOLIC PANEL WITH GFR
ALT: 16 U/L (ref 0–44)
AST: 19 U/L (ref 15–41)
Albumin: 3.7 g/dL (ref 3.5–5.0)
Alkaline Phosphatase: 72 U/L (ref 38–126)
Anion gap: 13 (ref 5–15)
BUN: 25 mg/dL — ABNORMAL HIGH (ref 8–23)
CO2: 20 mmol/L — ABNORMAL LOW (ref 22–32)
Calcium: 9.3 mg/dL (ref 8.9–10.3)
Chloride: 111 mmol/L (ref 98–111)
Creatinine, Ser: 1.26 mg/dL — ABNORMAL HIGH (ref 0.61–1.24)
GFR, Estimated: 59 mL/min — ABNORMAL LOW (ref >60)
Glucose, Bld: 110 mg/dL — ABNORMAL HIGH (ref 70–99)
Potassium: 3.8 mmol/L (ref 3.5–5.1)
Sodium: 144 mmol/L (ref 135–145)
Total Bilirubin: 0.2 mg/dL (ref 0.0–1.2)
Total Protein: 6.5 g/dL (ref 6.5–8.1)

## 2023-11-03 LAB — PROTIME-INR
INR: 1.3 — ABNORMAL HIGH (ref 0.8–1.2)
Prothrombin Time: 16.8 s — ABNORMAL HIGH (ref 11.4–15.2)

## 2023-11-03 LAB — TYPE AND SCREEN
ABO/RH(D): O POS
Antibody Screen: NEGATIVE

## 2023-11-03 LAB — POC OCCULT BLOOD, ED: Fecal Occult Bld: POSITIVE — AB

## 2023-11-03 LAB — APTT: aPTT: 41 s — ABNORMAL HIGH (ref 24–36)

## 2023-11-03 MED ORDER — ACETAMINOPHEN 325 MG PO TABS: 650.0000 mg | ORAL_TABLET | Freq: Four times a day (QID) | ORAL | Status: DC | PRN

## 2023-11-03 MED ORDER — FLECAINIDE ACETATE 100 MG PO TABS
100.0000 mg | ORAL_TABLET | Freq: Two times a day (BID) | ORAL | Status: DC
  Administered 2023-11-04 – 2023-11-05 (×3): 100 mg via ORAL
  Filled 2023-11-03 (×3): qty 1

## 2023-11-03 MED ORDER — METOPROLOL SUCCINATE ER 25 MG PO TB24
25.0000 mg | ORAL_TABLET | Freq: Two times a day (BID) | ORAL | Status: DC
  Administered 2023-11-04 – 2023-11-05 (×3): 25 mg via ORAL
  Filled 2023-11-03 (×3): qty 1

## 2023-11-03 MED ORDER — DONEPEZIL HCL 5 MG PO TABS
5.0000 mg | ORAL_TABLET | Freq: Every day | ORAL | Status: DC
  Administered 2023-11-04: 5 mg via ORAL
  Filled 2023-11-03 (×2): qty 1

## 2023-11-03 MED ORDER — ONDANSETRON HCL 4 MG PO TABS: 4.0000 mg | ORAL_TABLET | Freq: Four times a day (QID) | ORAL | Status: DC | PRN

## 2023-11-03 MED ORDER — ATORVASTATIN CALCIUM 40 MG PO TABS
40.0000 mg | ORAL_TABLET | Freq: Every day | ORAL | Status: DC
  Administered 2023-11-04 – 2023-11-05 (×2): 40 mg via ORAL
  Filled 2023-11-03 (×2): qty 1

## 2023-11-03 MED ORDER — SODIUM CHLORIDE 0.9 % IV SOLN
INTRAVENOUS | Status: DC
Start: 2023-11-03 — End: 2023-11-04

## 2023-11-03 MED ORDER — ONDANSETRON HCL 4 MG/2ML IJ SOLN: 4.0000 mg | Freq: Four times a day (QID) | INTRAMUSCULAR | Status: DC | PRN

## 2023-11-03 MED ORDER — PANTOPRAZOLE SODIUM 40 MG IV SOLR
40.0000 mg | INTRAVENOUS | Status: DC
  Administered 2023-11-03 – 2023-11-04 (×2): 40 mg via INTRAVENOUS
  Filled 2023-11-03 (×2): qty 10

## 2023-11-03 MED ORDER — ACETAMINOPHEN 650 MG RE SUPP: 650.0000 mg | Freq: Four times a day (QID) | RECTAL | Status: DC | PRN

## 2023-11-03 NOTE — ED Notes (Signed)
 Pt ambulatory to restroom without assistance and with a steady gait. Urine is at bedside.

## 2023-11-03 NOTE — Hospital Course (Addendum)
 Andrew Perkins is a 73 y.o. male with PMH of hypertension, PREdiabetes mellitus with HLD, A-fib on Eliquis , history of SVT/mild AR/left atrial enlargement, anemia, chronic neck pain presented to the ED with complaint of rectal bleeding.  Patient reports bleeding started around 2 AM, has had 6-7 episodes of rectal bleeding consisting of dark red/black content.  He has associated abdominal pain, dizziness mostly when standing.  He has started Metamucil the night as he was constipated for couple of days, and he started having loose stool since 2 am today, initially thought diarrhea was from diet and has been having diarrhea all day.He had recent c-scope w/ polypectomy on Sept 4  and was holding his eliquis  until Sunday. Originally he did not notice blood as he is color blind but his daughter noticed.  Patient otherwise denies any nausea, vomiting, chest pain, shortness of breath, fever, chills, headache, focal weakness, numbness tingling, speech difficulties  In the ED: Vitals stable BP 140s-120s systolic, labs with BUN 25 creatinine 1.2-recent baseline 70/1.0 on October/2022, hemoglobin 10.4-last hemoglobin  12 gm 01/26/2021 -as per EDP hemoglobin was around 12 g back in July and out of the Care Everywhere notes but I am unable to find INR 1.3.Rectal exam showed had fresh blood. GI Dr Burnette was consulted. Hospitalist was consulted for further admission  Assessment and plan:  Rectal bleeding ABLA Chronically anticoagulant Eliquis  Recent colonoscopy with polypectomy on 9/4: Patient with rectal bleeding in the setting of recent colonoscopy, also on Eliquis .  Will admit the patient and watch for further drop in hemoglobin transfuse less than 7 g.  GI has been consulted.  Will hold off on Eliquis  for now.  Keep n.p.o. gentle IV fluid hydration  Elevated creatinine/mild AKI: Continue IV fluid hydration monitor  Atrial fibrillation: Monitor on telemetry hold Eliquis  (discussed risks of stroke /benefits and he  is agreeable w/ holding) Continue his flecainide  and Toprol . Hold prn cardizem .  HTN: BP stable resume home meds-metoprolol  only. Hold losartan  and amlodipine.  Prediabetes with hyperlipidemia: Blood sugar stable.  Resume home statin.  Chronic neck pain stable  On Aricept -continue  Class I Obesity w/ Body mass index is 31.25 kg/m.: Will benefit with PCP follow-up, weight loss,healthy lifestyle  DVT prophylaxis: SCDs Start: 11/03/23 1952 Code Status:   Code Status: Full Code  Patient status is: Remains hospitalized because of severity of illness Level of care: Telemetry   Objective: Vitals last 24 hrs: Vitals:   11/03/23 1627 11/03/23 1700  BP: (!) 144/81 123/77  Pulse: 74 77  Resp: 18 15  Temp: 98.3 F (36.8 C)   TempSrc: Oral   SpO2: 95% 92%  Weight: 113.4 kg   Height: 6' 3 (1.905 m)     Physical Examination: General exam: alert awake, oriented, pleasant HEENT:Oral mucosa moist, Ear/Nose WNL grossly Respiratory system: Bilaterally clear BS,no use of accessory muscle Cardiovascular system: S1 & S2 +, No JVD. Gastrointestinal system: Abdomen soft,NT,ND, BS+ Nervous System: Alert, awake, moving all extremities,and following commands. Extremities: extremities warm, leg edema neg Skin: No rashes,no icterus. MSK: Normal muscle bulk,tone, power   Medications reviewed:  Scheduled Meds:  atorvastatin   40 mg Oral Daily   donepezil   5 mg Oral QHS   [START ON 11/04/2023] flecainide   100 mg Oral BID   [START ON 11/04/2023] metoprolol  succinate  25 mg Oral BID   pantoprazole  (PROTONIX ) IV  40 mg Intravenous Q24H   Continuous Infusions:  sodium chloride      Diet: Diet Order  Diet NPO time specified  Diet effective now

## 2023-11-03 NOTE — ED Notes (Signed)
 Sent lab a lav and pink tube.  Message was unclear which they needed so I sent both.

## 2023-11-03 NOTE — ED Provider Notes (Signed)
 [place] EMERGENCY DEPARTMENT AT [facility] Provider Note   CSN: [id number] Arrival date & time: 11/03/23  1617     Patient presents with: Rectal Bleeding  Acie Custis is a 73 y.o. male with PMH of A-fib on eliquis , HTN, HLD, and prediabetes who presents with a chief concern of rectal bleeding.  The patient underwent a colonoscopy with polypectomy on 10/26/23.  His Eliquis  was held until this Sunday.  He started taking his Eliquis  without any signs of bleeding.  However, he then began to have rectal bleeding this morning around 2 AM. He originally thought that his symptoms were just due to diarrhea from starting Metamucil the night before.  He noticed a foul odor in his stool.  He did not originally notice the blood in his stool because he is color blind.  He had 6 or 7 bowel movements today that were loose, was abnormal for him.  His family member looked in the toilet and saw that there was blood mixed with his stool.  This prompted his presentation to the ER.  He has associated abdominal pain and lightheadedness.  His abdominal pain is only present with defecation.  He has no history of colorectal cancer, no history of IBD, no liver disease, no NSAIDs.  He denies epistaxis, hematemesis, hematuria.  There is no loss of consciousness.      Prior to Admission medications   Medication Sig Start Date End Date Taking? Authorizing Provider  amLODipine (NORVASC) 5 MG tablet Take 5 mg by mouth daily.    [provider]  apixaban  (ELIQUIS ) 5 MG TABS tablet Take 1 tablet (5 mg total) by mouth 2 (two) times daily. 08/31/23   Nishan, Peter C, MD  Ascorbic Acid (VITAMIN C PO) Take 1 tablet by mouth daily. Not sure the dose    [provider]  atorvastatin  (LIPITOR) 40 MG tablet Take 1 tablet (40 mg total) by mouth daily. 11/20/20   Dunn, Dayna N, PA-C  diltiazem  (CARDIZEM ) 30 MG tablet Take 1 tablet every 4 hours AS NEEDED for heart rate >100 09/08/21   Dow Arland BROCKS, NP   donepezil  (ARICEPT ) 5 MG tablet Take 1 tablet (5 mg total) by mouth at bedtime. 04/10/23   Camara, Amadou, MD  ergocalciferol (VITAMIN D2) 1.25 MG (50000 UT) capsule 1 capsule Orally once a week x 8 weeks 02/18/22   [provider]  flecainide  (TAMBOCOR ) 100 MG tablet Take 1 tablet (100 mg total) by mouth 2 (two) times daily. 03/09/23   Fenton, Clint R, PA  losartan  (COZAAR ) 100 MG tablet Take 100 mg by mouth daily.    [provider]  metoprolol  succinate (TOPROL -XL) 25 MG 24 hr tablet Take 1 tablet (25 mg total) by mouth 2 (two) times daily. 09/28/23   Terra Fairy PARAS, PA-C  Multiple Vitamins-Minerals (MULTI FOR HIM 50+) TABS Take 1 tablet by mouth every morning.    [provider]  pantoprazole  (PROTONIX ) 40 MG tablet Take 40 mg by mouth as needed.     [provider]  psyllium (HYDROCIL/METAMUCIL) 95 % PACK Take 1 packet by mouth daily.    [provider]  zinc gluconate 50 MG tablet Take 50 mg by mouth daily.    [provider]     Allergies: Penicillins    Review of Systems  Constitutional:  Negative for fever.  HENT:  Negative for nosebleeds.   Gastrointestinal:  Positive for abdominal pain and hematochezia. Negative for vomiting.  Neurological:  Positive  for light-headedness.    Updated Vital Signs BP 123/77   Pulse 77   Temp 98.3 F (36.8 C) (Oral)   Resp 15   Ht 6' 3 (1.905 m)   Wt 113.4 kg   SpO2 92%   BMI 31.25 kg/m   Physical Exam Constitutional:      General: He is not in acute distress.    Appearance: He is not ill-appearing.  Eyes:     Extraocular Movements: Extraocular movements intact.     Pupils: Pupils are equal, round, and reactive to light.  Cardiovascular:     Rate and Rhythm: Normal rate and regular rhythm.  Pulmonary:     Effort: No respiratory distress.     Breath sounds: Normal breath sounds.  Abdominal:     General: Abdomen is flat. There is no distension.     Tenderness: There is no  abdominal tenderness. There is no guarding.  Genitourinary:    Rectum: Guaiac result positive.     Comments: Rectal exam without hemorrhoids.  Frank blood Skin:    Coloration: Skin is not jaundiced.     Findings: No bruising, erythema or rash.  Neurological:     Mental Status: He is alert.     (all labs ordered are listed, but only abnormal results are displayed) Labs Reviewed  COMPREHENSIVE METABOLIC PANEL WITH GFR - Abnormal; Notable for the following components:      Result Value   CO2 20 (*)    Glucose, Bld 110 (*)    BUN 25 (*)    Creatinine, Ser 1.26 (*)    GFR, Estimated 59 (*)    All other components within normal limits  CBC - Abnormal; Notable for the following components:   RBC 3.58 (*)    Hemoglobin 10.4 (*)    HCT 33.0 (*)    All other components within normal limits  APTT - Abnormal; Notable for the following components:   aPTT 41 (*)    All other components within normal limits  PROTIME-INR - Abnormal; Notable for the following components:   Prothrombin Time 16.8 (*)    INR 1.3 (*)    All other components within normal limits  POC OCCULT BLOOD, ED - Abnormal; Notable for the following components:   Fecal Occult Bld POSITIVE (*)    All other components within normal limits  TYPE AND SCREEN    Procedures   Medications Ordered in the ED - No data to display    Medical Decision Making Amount and/or Complexity of Data Reviewed Labs: ordered.  Risk Decision regarding hospitalization.   This patient presents to the ED for concern of rectal bleeding, this involves an extensive number of treatment options, and is a complaint that carries with it a high risk of complications and morbidity.  The differential diagnosis includes post polypectomy bleeding, perforation, hemorrhoids, diverticulitis.  Given the timing of his symptoms, it is most likely that he is experiencing a post polypectomy bleed in the setting of restarting his Eliquis .  Rectal exam showed  fresh blood.  Unclear why his bleeding started on Thursday rather than Sunday but it is possible that his Eliquis  had not reached a steady state yet or his bowels were not as active as they were this evening.  He is hemodynamically stable however his hemoglobin has dropped 2 points to 10.4 from 12.9 in July.  He is also experiencing some lightheadedness as well.  His bleeding is still ongoing and given his symptoms, we will admit for  observation to ensure his bleeding has stopped and he remains hemodynamically stable. Consulted GI who will see him in the morning.  His bleeding is not high-volume enough to necessitate a CTA at this time.    Co morbidities that complicate the patient evaluation  A-fib on anticoagulation, hypertension   Additional history obtained:  Additional history obtained from family member at bedside External records from outside source obtained and reviewed including PCP notes on July 9 with hemoglobin of 12.9   Lab Tests:  I Ordered, and personally interpreted labs.  The pertinent results include:  Hgb of 10.4, decreased from baseline. Creatinine of 1.26, mildly increased from baseline. Elevated PT and INR, appropriate for his Eliquis  use.  Consultations Obtained:  I requested consultation with [facility], Dr. Burnette, and discussed lab and imaging findings as well as pertinent plan - they recommend: admission for observation.  Discussed admission with [facility] and they agree to admit the patient.   Test / Admission - Considered:  The patient requires admission due to ongoing bleeding in the setting of anticoagulation, and acute drop in hemoglobin.  GI consulted and they will see him in the morning. [facility] to admit.   Final diagnoses:  Lower GI bleed    ED Discharge Orders     None          Napoleon Limes, MD 11/03/23 1949    Dean Clarity, MD 11/03/23 2032

## 2023-11-03 NOTE — ED Notes (Signed)
 ED TO INPATIENT HANDOFF REPORT  Name/Age/Gender Andrew Perkins 73 y.o. male  Code Status    Code Status Orders  (From admission, onward)           Start     Ordered   11/03/23 1952  Full code  Continuous       Question:  By:  Answer:  Consent: discussion documented in EHR   11/03/23 1952           Code Status History     Date Active Date Inactive Code Status Order ID Comments User Context   04/17/2013 2339 04/19/2013 1609 Full Code [id number]  Franky Redia SAILOR, MD Inpatient       Home/SNF/Other Home  Chief Complaint Rectal bleeding [K62.5]  Level of Care/Admitting Diagnosis ED Disposition     ED Disposition  Admit   Condition  --   Comment  Hospital Area: [facility] [[id number]]  Level of Care: Telemetry [5]  Admit to tele based on following criteria: Complex arrhythmia (Bradycardia/Tachycardia)  May place patient in observation at [facility] or [facility] if equivalent level of care is available:: No  Covid Evaluation: Asymptomatic - no recent exposure (last 10 days) testing not required  Diagnosis: Rectal bleeding [217577]  Admitting Physician: CHRISTOBAL GUADALAJARA [[id number]]  Attending Physician: CHRISTOBAL GUADALAJARA [[id number]]  For patients discharging to extended facilities (i.e. SNF, AL, group homes or LTAC) initiate:: Discharge to SNF/Facility Placement COVID-19 Lab Testing Protocol          Medical History Past Medical History:  Diagnosis Date   Anemia    a. Mild per pt - previously on iron but did not tolerate.   Chronic neck pain 06/27/2017   Diabetes mellitus without complication (HCC)    borderline   Erectile dysfunction    Former consumption of alcohol    a. Abstinent as of 03/2013.   Hyperlipidemia    Hypertension    Left atrial enlargement    Mild aortic insufficiency    PAF (paroxysmal atrial fibrillation) (HCC)    SVT (supraventricular tachycardia) (HCC)    Vitamin D deficiency     Allergies Allergies  Allergen Reactions    Penicillins Other (See Comments)    syncope    IV Location/Drains/Wounds Patient Lines/Drains/Airways Status     Active Line/Drains/Airways     Name Placement date Placement time Site Days   Peripheral IV 11/03/23 20 G 1 Anterior;Right Forearm 11/03/23  1705  Forearm  less than 1            Labs/Imaging Results for orders placed or performed during the hospital encounter of 11/03/23 (from the past 48 hours)  Type and screen [facility]     Status: None   Collection Time: 11/03/23  5:07 PM  Result Value Ref Range   ABO/RH(D) O POS    Antibody Screen NEG    Sample Expiration      11/06/2023,2359 Performed at [facility], [address], [place], [place] [address]   Comprehensive metabolic panel     Status: Abnormal   Collection Time: 11/03/23  5:08 PM  Result Value Ref Range   Sodium 144 135 - 145 mmol/L   Potassium 3.8 3.5 - 5.1 mmol/L   Chloride 111 98 - 111 mmol/L   CO2 20 (L) 22 - 32 mmol/L   Glucose, Bld 110 (H) 70 - 99 mg/dL    Comment: Glucose reference range applies only to samples taken after fasting for at least  8 hours.   BUN 25 (H) 8 - 23 mg/dL   Creatinine, Ser 8.73 (H) 0.61 - 1.24 mg/dL   Calcium  9.3 8.9 - 10.3 mg/dL   Total Protein 6.5 6.5 - 8.1 g/dL   Albumin 3.7 3.5 - 5.0 g/dL   AST 19 15 - 41 U/L    Comment: HEMOLYSIS AT THIS LEVEL MAY AFFECT RESULT   ALT 16 0 - 44 U/L   Alkaline Phosphatase 72 38 - 126 U/L   Total Bilirubin 0.2 0.0 - 1.2 mg/dL   GFR, Estimated 59 (L) >60 mL/min    Comment: (NOTE) Calculated using the CKD-EPI Creatinine Equation (2021)    Anion gap 13 5 - 15    Comment: Performed at [facility], [address], [place], [place] [address]  CBC     Status: Abnormal   Collection Time: 11/03/23  5:08 PM  Result Value Ref Range   WBC 8.5 4.0 - 10.5 K/uL   RBC 3.58 (L) 4.22 - 5.81 MIL/uL   Hemoglobin 10.4 (L) 13.0 - 17.0 g/dL   HCT 66.9 (L) 60.9 - 47.9 %   MCV 92.2  80.0 - 100.0 fL   MCH 29.1 26.0 - 34.0 pg   MCHC 31.5 30.0 - 36.0 g/dL   RDW 84.8 88.4 - 84.4 %   Platelets 225 150 - 400 K/uL   nRBC 0.0 0.0 - 0.2 %    Comment: Performed at [facility], [address], [place], [place] [address]  APTT     Status: Abnormal   Collection Time: 11/03/23  5:08 PM  Result Value Ref Range   aPTT 41 (H) 24 - 36 seconds    Comment:        IF BASELINE aPTT IS ELEVATED, SUGGEST PATIENT RISK ASSESSMENT BE USED TO DETERMINE APPROPRIATE ANTICOAGULANT THERAPY. Performed at [facility], [address], [place], [place] [address]   Protime-INR     Status: Abnormal   Collection Time: 11/03/23  5:08 PM  Result Value Ref Range   Prothrombin Time 16.8 (H) 11.4 - 15.2 seconds   INR 1.3 (H) 0.8 - 1.2    Comment: (NOTE) INR goal varies based on device and disease states. Performed at [facility], [address], [place], [place] [address]   POC occult blood, ED     Status: Abnormal   Collection Time: 11/03/23  7:07 PM  Result Value Ref Range   Fecal Occult Bld POSITIVE (A) NEGATIVE   No results found.  Pending Labs Unresulted Labs (From admission, onward)     Start     Ordered   11/04/23 0500  Basic metabolic panel with GFR  Tomorrow morning,   R        11/03/23 1957   11/04/23 0500  CBC  Tomorrow morning,   R        11/03/23 1957   11/03/23 2200  Hemoglobin and hematocrit, blood  Now then every 8 hours,   R (with TIMED occurrences)      11/03/23 1933            Vitals/Pain Today's Vitals   11/03/23 1627 11/03/23 1648 11/03/23 1700  BP: (!) 144/81  123/77  Pulse: 74  77  Resp: 18  15  Temp: 98.3 F (36.8 C)    TempSrc: Oral    SpO2: 95%  92%  Weight: 113.4 kg    Height: 6' 3 (1.905 m)    PainSc:  0-No  pain     Isolation Precautions No active isolations  Medications Medications  0.9 %  sodium chloride  infusion (has no administration in time range)  acetaminophen  (TYLENOL )  tablet 650 mg (has no administration in time range)    Or  acetaminophen  (TYLENOL ) suppository 650 mg (has no administration in time range)  ondansetron  (ZOFRAN ) tablet 4 mg (has no administration in time range)    Or  ondansetron  (ZOFRAN ) injection 4 mg (has no administration in time range)  pantoprazole  (PROTONIX ) injection 40 mg (has no administration in time range)  atorvastatin  (LIPITOR) tablet 40 mg (has no administration in time range)  donepezil  (ARICEPT ) tablet 5 mg (has no administration in time range)  flecainide  (TAMBOCOR ) tablet 100 mg (has no administration in time range)  metoprolol  succinate (TOPROL -XL) 24 hr tablet 25 mg (has no administration in time range)    Mobility walks with person assist

## 2023-11-03 NOTE — H&P (Signed)
 History and Physical    Andrew Perkins:[id number] DOB: 1950-08-10 DOA: 11/03/2023  PCP: Kip Righter, MD   Patient coming from: Home Chief Complaint  Patient presents with   Rectal Bleeding   HPI: Andrew Perkins is a 73 y.o. male with PMH of hypertension, PREdiabetes mellitus with HLD, A-fib on Eliquis , history of SVT/mild AR/left atrial enlargement, anemia, chronic neck pain presented to the ED with complaint of rectal bleeding.  Patient reports bleeding started around 2 AM, has had 6-7 episodes of rectal bleeding consisting of dark red/black content.  He has associated abdominal pain, dizziness mostly when standing.  He has started Metamucil the night as he was constipated for couple of days, and he started having loose stool since 2 am today, initially thought diarrhea was from diet and has been having diarrhea all day.He had recent c-scope w/ polypectomy on Sept 4  and was holding his eliquis  until Sunday. Originally he did not notice blood as he is color blind but his daughter noticed.  Patient otherwise denies any nausea, vomiting, chest pain, shortness of breath, fever, chills, headache, focal weakness, numbness tingling, speech difficulties  In the ED: Vitals stable BP 140s-120s systolic, labs with BUN 25 creatinine 1.2-recent baseline 70/1.0 on October/2022, hemoglobin 10.4-last hemoglobin  12 gm 01/26/2021 -as per EDP hemoglobin was around 12 g back in July and out of the Care Everywhere notes but I am unable to find INR 1.3.Rectal exam showed had fresh blood. GI Dr Burnette was consulted. Hospitalist was consulted for further admission  Assessment and plan:  Rectal bleeding ABLA Chronically anticoagulant Eliquis  Recent colonoscopy with polypectomy on 9/4: Patient with rectal bleeding in the setting of recent colonoscopy, also on Eliquis .  Will admit the patient and watch for further drop in hemoglobin transfuse less than 7 g.  GI has been consulted.  Will hold off on Eliquis  for now.   Keep n.p.o. gentle IV fluid hydration  Elevated creatinine/mild AKI: Continue IV fluid hydration monitor  Atrial fibrillation: Monitor on telemetry hold Eliquis  (discussed risks of stroke /benefits and he is agreeable w/ holding) Continue his flecainide  and Toprol . Hold prn cardizem .  HTN: BP stable resume home meds-metoprolol  only. Hold losartan  and amlodipine.  Prediabetes with hyperlipidemia: Blood sugar stable.  Resume home statin.  Chronic neck pain stable  On Aricept -continue  Class I Obesity w/ Body mass index is 31.25 kg/m.: Will benefit with PCP follow-up, weight loss,healthy lifestyle  DVT prophylaxis: SCDs Start: 11/03/23 1952 Code Status:   Code Status: Full Code  Patient status is: Remains hospitalized because of severity of illness Level of care: Telemetry   Objective: Vitals last 24 hrs: Vitals:   11/03/23 1627 11/03/23 1700  BP: (!) 144/81 123/77  Pulse: 74 77  Resp: 18 15  Temp: 98.3 F (36.8 C)   TempSrc: Oral   SpO2: 95% 92%  Weight: 113.4 kg   Height: 6' 3 (1.905 m)     Physical Examination: General exam: alert awake, oriented, pleasant HEENT:Oral mucosa moist, Ear/Nose WNL grossly Respiratory system: Bilaterally clear BS,no use of accessory muscle Cardiovascular system: S1 & S2 +, No JVD. Gastrointestinal system: Abdomen soft,NT,ND, BS+ Nervous System: Alert, awake, moving all extremities,and following commands. Extremities: extremities warm, leg edema neg Skin: No rashes,no icterus. MSK: Normal muscle bulk,tone, power   Medications reviewed:  Scheduled Meds:  atorvastatin   40 mg Oral Daily   donepezil   5 mg Oral QHS   [START ON 11/04/2023] flecainide   100 mg Oral BID   [START ON  11/04/2023] metoprolol  succinate  25 mg Oral BID   pantoprazole  (PROTONIX ) IV  40 mg Intravenous Q24H   Continuous Infusions:  sodium chloride      Diet: Diet Order             Diet NPO time specified  Diet effective now                      Severity of Illness: The appropriate patient status for this patient is OBSERVATION. Observation status is judged to be reasonable and necessary in order to provide the required intensity of service to ensure the patient's safety. The patient's presenting symptoms, physical exam findings, and initial radiographic and laboratory data in the context of their medical condition is felt to place them at decreased risk for further clinical deterioration. Furthermore, it is anticipated that the patient will be medically stable for discharge from the hospital within 2 midnights of admission.   Family Communication: Admission, patients condition and plan of care including tests being ordered have been discussed with the patient and daughter who indicate understanding and agree with the plan and Code Status.  Consults called:  [facility]  Review of Systems: All systems were reviewed and were negative except as mentioned in HPI above. Negative for fever Negative for chest pain Negative for shortness of breath  Past Medical History:  Diagnosis Date   Anemia    a. Mild per pt - previously on iron but did not tolerate.   Chronic neck pain 06/27/2017   Diabetes mellitus without complication (HCC)    borderline   Erectile dysfunction    Former consumption of alcohol    a. Abstinent as of 03/2013.   Hyperlipidemia    Hypertension    Left atrial enlargement    Mild aortic insufficiency    PAF (paroxysmal atrial fibrillation) (HCC)    SVT (supraventricular tachycardia) (HCC)    Vitamin D deficiency     Past Surgical History:  Procedure Laterality Date   CIRCUMCISION     at 73yrs old   TONSILLECTOMY     WISDOM TOOTH EXTRACTION       reports that he has never smoked. He has never used smokeless tobacco. He reports that he does not drink alcohol and does not use drugs.  Allergies  Allergen Reactions   Penicillins Other (See Comments)    syncope    Family History  Problem Relation Age of  Onset   Diabetes Mellitus II Brother    Asthma Brother    CAD Neg Hx    Stroke Neg Hx    Heart attack Neg Hx      Prior to Admission medications   Medication Sig Start Date End Date Taking? Authorizing Provider  amLODipine (NORVASC) 5 MG tablet Take 5 mg by mouth daily.    [provider]  apixaban  (ELIQUIS ) 5 MG TABS tablet Take 1 tablet (5 mg total) by mouth 2 (two) times daily. 08/31/23   Nishan, Peter C, MD  Ascorbic Acid (VITAMIN C PO) Take 1 tablet by mouth daily. Not sure the dose    [provider]  atorvastatin  (LIPITOR) 40 MG tablet Take 1 tablet (40 mg total) by mouth daily. 11/20/20   Dunn, Dayna N, PA-C  diltiazem  (CARDIZEM ) 30 MG tablet Take 1 tablet every 4 hours AS NEEDED for heart rate >100 09/08/21   Dow Arland BROCKS, NP  donepezil  (ARICEPT ) 5 MG tablet Take 1 tablet (5 mg total) by mouth at bedtime. 04/10/23  Camara, Amadou, MD  ergocalciferol (VITAMIN D2) 1.25 MG (50000 UT) capsule 1 capsule Orally once a week x 8 weeks 02/18/22   [provider]  flecainide  (TAMBOCOR ) 100 MG tablet Take 1 tablet (100 mg total) by mouth 2 (two) times daily. 03/09/23   Fenton, Clint R, PA  losartan  (COZAAR ) 100 MG tablet Take 100 mg by mouth daily.    [provider]  metoprolol  succinate (TOPROL -XL) 25 MG 24 hr tablet Take 1 tablet (25 mg total) by mouth 2 (two) times daily. 09/28/23   Terra Fairy PARAS, PA-C  Multiple Vitamins-Minerals (MULTI FOR HIM 50+) TABS Take 1 tablet by mouth every morning.    [provider]  pantoprazole  (PROTONIX ) 40 MG tablet Take 40 mg by mouth as needed.     [provider]  psyllium (HYDROCIL/METAMUCIL) 95 % PACK Take 1 packet by mouth daily.    [provider]  zinc gluconate 50 MG tablet Take 50 mg by mouth daily.    [provider]   r   Labs on Admission: I have personally reviewed following labs and imaging studies  CBC: Recent Labs  Lab 11/03/23 1708  WBC 8.5  HGB 10.4*  HCT  33.0*  MCV 92.2  PLT 225   Basic Metabolic Panel: Recent Labs  Lab 11/03/23 1708  NA 144  K 3.8  CL 111  CO2 20*  GLUCOSE 110*  BUN 25*  CREATININE 1.26*  CALCIUM  9.3   Estimated Creatinine Clearance: 68.9 mL/min (A) (by C-G formula based on SCr of 1.26 mg/dL (H)). Recent Labs  Lab 11/03/23 1708  AST 19  ALT 16  ALKPHOS 72  BILITOT 0.2  PROT 6.5  ALBUMIN 3.7   No results for input(s): LIPASE, AMYLASE in the last 168 hours. No results for input(s): AMMONIA in the last 168 hours. Coagulation Profile: Recent Labs  Lab 11/03/23 1708  INR 1.3*   Cardiac Panel (last 3 results) No results for input(s): CKTOTAL, CKMB, TROPONINIHS, RELINDX in the last 72 hours.  BNP (last 3 results) No results for input(s): PROBNP in the last 8760 hours. HbA1C: No results for input(s): HGBA1C in the last 72 hours. CBG: No results for input(s): GLUCAP in the last 168 hours. Lipid Profile: No results for input(s): CHOL, HDL, LDLCALC, TRIG, CHOLHDL, LDLDIRECT in the last 72 hours. Thyroid  Function Tests: No results for input(s): TSH, T4TOTAL, FREET4, T3FREE, THYROIDAB in the last 72 hours. Urine analysis: No results found for: COLORURINE, APPEARANCEUR, LABSPEC, PHURINE, GLUCOSEU, HGBUR, BILIRUBINUR, KETONESUR, PROTEINUR, UROBILINOGEN, NITRITE, LEUKOCYTESUR  Radiological Exams on Admission: No results found.  Mennie LAMY MD [facility]  If 7PM-7AM, please contact night-coverage [email or web address]  11/03/2023, 7:55 PM

## 2023-11-03 NOTE — ED Notes (Signed)
 Pt ambulatory to restroom and back to bed with a steady gait and no assistance.

## 2023-11-03 NOTE — ED Triage Notes (Signed)
 Pt presents to ED from home C/O dark red/black bleeding from rectum X 5 episodes today. Takes Eliquis , recent colonoscopy. Endorses dizziness on standing. Denies CP, SOB.

## 2023-11-04 DIAGNOSIS — K625 Hemorrhage of anus and rectum: Secondary | ICD-10-CM

## 2023-11-04 DIAGNOSIS — D649 Anemia, unspecified: Secondary | ICD-10-CM | POA: Diagnosis not present

## 2023-11-04 DIAGNOSIS — Z7901 Long term (current) use of anticoagulants: Secondary | ICD-10-CM | POA: Diagnosis not present

## 2023-11-04 DIAGNOSIS — K921 Melena: Secondary | ICD-10-CM | POA: Diagnosis not present

## 2023-11-04 LAB — CBC
HCT: 28 % — ABNORMAL LOW (ref 39.0–52.0)
Hemoglobin: 8.7 g/dL — ABNORMAL LOW (ref 13.0–17.0)
MCH: 28.4 pg (ref 26.0–34.0)
MCHC: 31.1 g/dL (ref 30.0–36.0)
MCV: 91.5 fL (ref 80.0–100.0)
Platelets: 189 K/uL (ref 150–400)
RBC: 3.06 MIL/uL — ABNORMAL LOW (ref 4.22–5.81)
RDW: 15.2 % (ref 11.5–15.5)
WBC: 7.5 K/uL (ref 4.0–10.5)
nRBC: 0 % (ref 0.0–0.2)

## 2023-11-04 LAB — BASIC METABOLIC PANEL WITH GFR
Anion gap: 9 (ref 5–15)
BUN: 19 mg/dL (ref 8–23)
CO2: 21 mmol/L — ABNORMAL LOW (ref 22–32)
Calcium: 8.5 mg/dL — ABNORMAL LOW (ref 8.9–10.3)
Chloride: 112 mmol/L — ABNORMAL HIGH (ref 98–111)
Creatinine, Ser: 0.93 mg/dL (ref 0.61–1.24)
GFR, Estimated: >60 mL/min (ref >60)
Glucose, Bld: 96 mg/dL (ref 70–99)
Potassium: 3.7 mmol/L (ref 3.5–5.1)
Sodium: 143 mmol/L (ref 135–145)

## 2023-11-04 LAB — HEMOGLOBIN AND HEMATOCRIT, BLOOD
HCT: 27.2 % — ABNORMAL LOW (ref 39.0–52.0)
HCT: 27.9 % — ABNORMAL LOW (ref 39.0–52.0)
HCT: 28.5 % — ABNORMAL LOW (ref 39.0–52.0)
HCT: 30 % — ABNORMAL LOW (ref 39.0–52.0)
Hemoglobin: 8.7 g/dL — ABNORMAL LOW (ref 13.0–17.0)
Hemoglobin: 8.7 g/dL — ABNORMAL LOW (ref 13.0–17.0)
Hemoglobin: 9 g/dL — ABNORMAL LOW (ref 13.0–17.0)
Hemoglobin: 9.6 g/dL — ABNORMAL LOW (ref 13.0–17.0)

## 2023-11-04 MED ORDER — POTASSIUM CHLORIDE CRYS ER 20 MEQ PO TBCR
40.0000 meq | EXTENDED_RELEASE_TABLET | Freq: Once | ORAL | Status: AC
  Administered 2023-11-04: 40 meq via ORAL
  Filled 2023-11-04: qty 2

## 2023-11-04 MED ORDER — ORAL CARE MOUTH RINSE: 15.0000 mL | OROMUCOSAL | Status: DC | PRN

## 2023-11-04 MED ORDER — SODIUM CHLORIDE 0.9 % IV SOLN: INTRAVENOUS | Status: DC

## 2023-11-04 NOTE — Consult Note (Signed)
 [facility] Consultation Note  Referring Provider: [facility] Primary Care Physician:  Kip Righter, MD Primary Gastroenterologist:  Dr. Elicia  Reason for Consultation:  blood in stool  HPI: Andrew Perkins is a 73 y.o. male presenting maroon stools.  Had colonoscopy earlier this month with removal of large 3 cm ascending colon polyp removed piecemeal with placement of hemoclips over polypectomy site.  Previously on apixaban , which was resumed couple days after his procedure, last dose yesterday morning.  Doing well until foul smelling gas/stool followed by maroon stool (he showed me photograph on his phone) yesterday afternoon.  He has no abdominal pain, fevers.     Past Medical History:  Diagnosis Date   Anemia    a. Mild per pt - previously on iron but did not tolerate.   Chronic neck pain 06/27/2017   Diabetes mellitus without complication (HCC)    borderline   Erectile dysfunction    Former consumption of alcohol    a. Abstinent as of 03/2013.   Hyperlipidemia    Hypertension    Left atrial enlargement    Mild aortic insufficiency    PAF (paroxysmal atrial fibrillation) (HCC)    SVT (supraventricular tachycardia) (HCC)    Vitamin D deficiency     Past Surgical History:  Procedure Laterality Date   CIRCUMCISION     at 73yrs old   TONSILLECTOMY     WISDOM TOOTH EXTRACTION      Prior to Admission medications   Medication Sig Start Date End Date Taking? Authorizing Provider  amLODipine (NORVASC) 5 MG tablet Take 5 mg by mouth daily.   Yes [provider]  apixaban  (ELIQUIS ) 5 MG TABS tablet Take 1 tablet (5 mg total) by mouth 2 (two) times daily. 08/31/23  Yes Nishan, Peter C, MD  Ascorbic Acid (VITAMIN C PO) Take 1 tablet by mouth daily.   Yes [provider]  atorvastatin  (LIPITOR) 40 MG tablet Take 1 tablet (40 mg total) by mouth daily. 11/20/20  Yes Dunn, Dayna N, PA-C  diltiazem  (CARDIZEM ) 30 MG tablet Take 1 tablet every 4 hours  AS NEEDED for heart rate >100 09/08/21  Yes Dow Arland BROCKS, NP  flecainide  (TAMBOCOR ) 100 MG tablet Take 1 tablet (100 mg total) by mouth 2 (two) times daily. 03/09/23  Yes Fenton, Clint R, PA  losartan  (COZAAR ) 100 MG tablet Take 100 mg by mouth daily.   Yes [provider]  metoprolol  succinate (TOPROL -XL) 25 MG 24 hr tablet Take 1 tablet (25 mg total) by mouth 2 (two) times daily. 09/28/23  Yes Terra Fairy PARAS, PA-C  Multiple Vitamins-Minerals (MULTI FOR HIM 50+) TABS Take 1 tablet by mouth every morning.   Yes [provider]  omeprazole (PRILOSEC OTC) 20 MG tablet Take 20 mg by mouth daily as needed.   Yes [provider]  zinc gluconate 50 MG tablet Take 50 mg by mouth daily.   Yes [provider]  donepezil  (ARICEPT ) 5 MG tablet Take 1 tablet (5 mg total) by mouth at bedtime. Patient not taking: Reported on 11/03/2023 04/10/23   Camara, Amadou, MD  psyllium (HYDROCIL/METAMUCIL) 95 % PACK Take 1 packet by mouth daily as needed.    [provider]    Current Facility-Administered Medications  Medication Dose Route Frequency Provider Last Rate Last Admin   0.9 %  sodium chloride  infusion   Intravenous Continuous Krishnan, Gokul, MD 50 mL/hr at 11/04/23 1024 New Bag at 11/04/23 1024   acetaminophen  (TYLENOL ) tablet 650 mg  650 mg Oral Q6H PRN Kc, Mennie, MD       Or   acetaminophen  (TYLENOL ) suppository 650 mg  650 mg Rectal Q6H PRN Kc, Ramesh, MD       atorvastatin  (LIPITOR) tablet 40 mg  40 mg Oral Daily Kc, Ramesh, MD   40 mg at 11/04/23 0935   donepezil  (ARICEPT ) tablet 5 mg  5 mg Oral QHS Kc, Ramesh, MD       flecainide  (TAMBOCOR ) tablet 100 mg  100 mg Oral BID Kc, Ramesh, MD   100 mg at 11/04/23 0935   metoprolol  succinate (TOPROL -XL) 24 hr tablet 25 mg  25 mg Oral BID Kc, Mennie, MD   25 mg at 11/04/23 0935   ondansetron  (ZOFRAN ) tablet 4 mg  4 mg Oral Q6H PRN Christobal Mennie, MD       Or   ondansetron  (ZOFRAN ) injection 4 mg  4 mg Intravenous  Q6H PRN Kc, Ramesh, MD       Oral care mouth rinse  15 mL Mouth Rinse PRN Kc, Ramesh, MD       pantoprazole  (PROTONIX ) injection 40 mg  40 mg Intravenous Q24H Kc, Ramesh, MD   40 mg at 11/03/23 2052    Allergies as of 11/03/2023 - Review Complete 11/03/2023  Allergen Reaction Noted   Penicillins Other (See Comments) 04/17/2013    Family History  Problem Relation Age of Onset   Diabetes Mellitus II Brother    Asthma Brother    CAD Neg Hx    Stroke Neg Hx    Heart attack Neg Hx     Social History   Socioeconomic History   Marital status: Single    Spouse name: Not on file   Number of children: Not on file   Years of education: 12   Highest education level: Not on file  Occupational History   Occupation: Retired  Tobacco Use   Smoking status: Never   Smokeless tobacco: Never  Vaping Use   Vaping status: Never Used  Substance and Sexual Activity   Alcohol use: No    Alcohol/week: 0.0 standard drinks of alcohol   Drug use: No   Sexual activity: Not on file  Other Topics Concern   Not on file  Social History Narrative   Pt lives in [place] alone.  Retired.   Caffeine use: none   Right handed    Social Drivers of Corporate investment banker Strain: Not on file  Food Insecurity: No Food Insecurity (11/04/2023)   Hunger Vital Sign    Worried About Running Out of Food in the Last Year: Never true    Ran Out of Food in the Last Year: Never true  Transportation Needs: No Transportation Needs (11/04/2023)   PRAPARE - Administrator, Civil Service (Medical): No    Lack of Transportation (Non-Medical): No  Physical Activity: Not on file  Stress: Not on file  Social Connections: Unknown (11/04/2023)   Social Connection and Isolation Panel    Frequency of Communication with Friends and Family: Twice a week    Frequency of Social Gatherings with Friends and Family: Three times a week    Attends Religious Services: 1 to 4 times per year    Active Member of  Clubs or Organizations: Yes    Attends Banker Meetings: 1 to 4 times per year    Marital Status: Patient declined  Intimate Partner Violence: Not At Risk (11/04/2023)   Humiliation, Afraid, Rape, and Kick  questionnaire    Fear of Current or Ex-Partner: No    Emotionally Abused: No    Physically Abused: No    Sexually Abused: No    Review of Systems: As per HPI, all others negative  Physical Exam: Vital signs in last 24 hours: Temp:  [96.9 F (36.1 C)-98.3 F (36.8 C)] 98 F (36.7 C) (09/13 1034) Pulse Rate:  [58-82] 62 (09/13 1034) Resp:  [15-25] 18 (09/13 1034) BP: (123-158)/(71-95) 139/79 (09/13 1034) SpO2:  [92 %-100 %] 99 % (09/13 1034) Weight:  [113.4 kg] 113.4 kg (09/12 1627) Last BM Date : 11/03/23 General:   Alert,  Well-developed, well-nourished, pleasant and cooperative in NAD Head:  Normocephalic and atraumatic. Eyes:  Sclera clear, no icterus.   Conjunctiva slightly pale Ears:  Normal auditory acuity. Nose:  No deformity, discharge,  or lesions. Mouth:  No deformity or lesions.  Oropharynx pale and dry Neck:  Supple; no masses or thyromegaly. Lungs:  No visible respiratory distress Abdomen:  Soft, nontender, protuberant butnondistended. No masses, hepatosplenomegaly or hernias noted. No guarding, and without rebound.     Msk:  Symmetrical without gross deformities. Normal posture. Pulses:  Normal pulses noted. Extremities:  Without clubbing or edema. Neurologic:  Alert and  oriented x4;  grossly normal neurologically. Skin:  Intact without significant lesions or rashes. Psych:  Alert and cooperative. Normal mood and affect.   Lab Results: Recent Labs    11/03/23 1708 11/03/23 2344 11/04/23 0841  WBC 8.5  --   --   HGB 10.4* 9.6* 8.7*  HCT 33.0* 30.0* 27.2*  PLT 225  --   --    BMET Recent Labs    11/03/23 1708 11/04/23 0841  NA 144 143  K 3.8 3.7  CL 111 112*  CO2 20* 21*  GLUCOSE 110* 96  BUN 25* 19  CREATININE 1.26* 0.93   CALCIUM  9.3 8.5*   LFT Recent Labs    11/03/23 1708  PROT 6.5  ALBUMIN 3.7  AST 19  ALT 16  ALKPHOS 72  BILITOT 0.2   PT/INR Recent Labs    11/03/23 1708  LABPROT 16.8*  INR 1.3*    Studies/Results: No results found.  Impression:   Maroon stools.  Last bleeding yesterday afternoon. Acute blood loss anemia Chronic anticoagulation, apixaban , last dose yesterday morning. Suspect post-polypectomy bleeding; colonoscopy earlier this month removal piecemeal large ascending colon polyp.  Plan:   Advance diet as tolerated.  Clear liquids now, if ok then consider full liquids later today. Follow CBC, transfuse as needed. Hold apixaban  for 10 days. If further bleeding, consider repeat colonoscopy; otherwise, would manage conservatively. [facility] will follow.   LOS: 0 days   Lei Dower M  11/04/2023, 11:00 AM  Cell [phone] If no answer or after 5 PM call [phone]

## 2023-11-04 NOTE — TOC Initial Note (Signed)
 Transition of Care [facility]) - Initial/Assessment Note    Patient Details  Name: Andrew Perkins MRN: [id number] Date of Birth: [birth year]  [facility]) CM/SW Contact:    Sonda Manuella Quill, RN Phone Number: 11/04/2023, 2:59 PM  Clinical Narrative:                 Spoke w/ pt and grand daughter Andrew Perkins in room; pt said he lives at home w/ his grandson; he plans to return at d/c; his grand daughter will provide transport; pt identified POC wife Andrew Perkins [phone]); pt does not have DME, HH services, or home oxygen; TOC will follow.  Expected Discharge Plan: Home/Self Care Barriers to Discharge: Continued Medical Work up   Patient Goals and CMS Choice Patient states their goals for this hospitalization and ongoing recovery are:: home CMS Medicare.gov Compare Post Acute Care list provided to:: Patient        Expected Discharge Plan and Services   Discharge Planning Services: CM Consult   Living arrangements for the past 2 months: Single Family Home                 DME Arranged: N/A DME Agency: NA       HH Arranged: NA HH Agency: NA        Prior Living Arrangements/Services Living arrangements for the past 2 months: Single Family Home Lives with:: Relatives Patient language and need for interpreter reviewed:: Yes Do you feel safe going back to the place where you live?: Yes      Need for Family Participation in Patient Care: Yes (Comment) Care giver support system in place?: Yes (comment) Current home services:  (n/a) Criminal Activity/Legal Involvement Pertinent to Current Situation/Hospitalization: No - Comment as needed  Activities of Daily Living   ADL Screening (condition at time of admission) Independently performs ADLs?: Yes (appropriate for developmental age) Is the patient deaf or have difficulty hearing?: No Does the patient have difficulty seeing, even when wearing glasses/contacts?: No Does the patient have difficulty concentrating,  remembering, or making decisions?: No  Permission Sought/Granted Permission sought to share information with : Case Manager Permission granted to share information with : Yes, Verbal Permission Granted  Share Information with NAME: Case Manager     Permission granted to share info w Relationship: Andrew Perkins (spouse) [phone]     Emotional Assessment Appearance:: Appears stated age Attitude/Demeanor/Rapport: Gracious Affect (typically observed): Accepting Orientation: : Oriented to Self, Oriented to Place, Oriented to  Time, Oriented to Situation Alcohol / Substance Use: Not Applicable Psych Involvement: No (comment)  Admission diagnosis:  Rectal bleeding [K62.5] Acute GI bleeding [K92.2] Anemia, unspecified type [D64.9] Anticoagulated on apixaban  [Z79.01] Patient Active Problem List   Diagnosis Date Noted   Rectal bleed 11/03/2023   ABLA (acute blood loss anemia) 11/03/2023   Rectal bleeding 11/03/2023   Encounter for monitoring flecainide  therapy 05/18/2023   Hypercoagulable state due to paroxysmal atrial fibrillation (HCC) 05/18/2023   DOE (dyspnea on exertion) 07/12/2019   Chronic neck pain 06/27/2017   Overweight 06/02/2014   Chest pain 04/30/2013   Former consumption of alcohol    Atrial fibrillation (HCC) 04/17/2013   Atrial fibrillation with RVR (HCC) 04/17/2013   HTN (hypertension) 04/17/2013   PCP:  Kip Righter, MD Pharmacy:   [facility] [phone] [address] [place], [place] - [address] [place] [address] Phone: [phone] Fax: [phone]  [facility] [place], [place] - [address] [place] [address] Phone: [phone] Fax: [phone]  [facility] [place]  - [place], [place] - [address] [facility] [address] [place] [address] Phone: [phone] Fax: [phone]     Social Drivers of Health (SDOH) Social History: SDOH Screenings    Food Insecurity: No Food Insecurity (11/04/2023)  Housing: Low Risk  (11/04/2023)  Transportation Needs: No Transportation Needs (11/04/2023)  Utilities: Not At Risk (11/04/2023)  Social Connections: Unknown (11/04/2023)  Tobacco Use: Low Risk  (11/03/2023)   SDOH Interventions: Food Insecurity Interventions: Intervention Not Indicated, Inpatient TOC Housing Interventions: Intervention Not Indicated, Inpatient TOC Transportation Interventions: Intervention Not Indicated, Inpatient TOC Utilities Interventions: Intervention Not Indicated, Inpatient TOC Social Connections Interventions: Intervention Not Indicated   Readmission Risk Interventions     No data to display

## 2023-11-04 NOTE — Progress Notes (Signed)
 [facility] PROGRESS NOTE   Andrew Perkins FMW:[id number] DOB: 1950-03-03 DOA: 11/03/2023  PCP: Kip Righter, MD  Brief History: 73 y.o. male with PMH of hypertension, Pre-diabetes with HLD, A-fib on Eliquis , history of SVT/mild AR/left atrial enlargement, anemia, chronic neck pain presented to the ED with complaint of rectal bleeding.  Patient had a colonoscopy recently on September 4 with polypectomy.  Eliquis  was held during that time.    Consultants: [facility]  Procedures: None yet    Subjective/Interval History: Had a bloody bowel movement yesterday which was stool mixed with blood.  No bowel movement since then.  Denies any abdominal pain nausea or vomiting.  Mentions that he is going on a cruise tomorrow.  This could be problematic.    Assessment/Plan:  Hematochezia in the setting of recent colonoscopy and polypectomy This is also in the setting of anticoagulation for atrial fibrillation Drop in hemoglobin noted.  Has not had any further bleeding since yesterday.  Abdomen is benign on examination.  Will recheck hemoglobin later today.  Eliquis  is on hold. Await GI input.  Acute blood loss anemia Hemoglobin dropped from 10.4 at admission to 8.7 this morning.  Will recheck labs later today.  Secondary to rectal bleeding.  Hopefully this will plateau soon.  Acute kidney injury Elevated BUN and creatinine noted.  Most likely due to hypovolemia from rectal bleeding.  Labs are pending from today.  Has been urinating.  Persistent atrial fibrillation Anticoagulation currently on hold.  His flecainide  and Toprol  are being continued.  Stable hemodynamically.  Essential hypertension Holding losartan  and amlodipine.  Remains on metoprolol .  Monitor blood pressures closely.  Upcoming travel He is supposed to be going on a cruise tomorrow.  I have told the patient that this could be problematic considering his bleeding and anticoagulation use which will need to be  determined over the next 24 to 48 hours.  Plus if he is on a cruise he may not get adequate medical care if he has recurrent bleeding.  Decision regarding altering his travel plans will be left to the patient.    Class I obesity Estimated body mass index is 31.25 kg/m as calculated from the following:   Height as of this encounter: 6' 3 (1.905 m).   Weight as of this encounter: 113.4 kg.  DVT Prophylaxis: On SCDs.  Anticoagulation on hold Code Status: Full code Family Communication: Discussed with patient.  No family at bedside Disposition Plan: Home when improved  Status is: Observation The patient remains OBS appropriate and may or may not d/c before 2 midnights.      Medications: Scheduled:  atorvastatin   40 mg Oral Daily   donepezil   5 mg Oral QHS   flecainide   100 mg Oral BID   metoprolol  succinate  25 mg Oral BID   pantoprazole  (PROTONIX ) IV  40 mg Intravenous Q24H   Continuous:  sodium chloride  75 mL/hr at 11/03/23 2053   PRN:acetaminophen  **OR** acetaminophen , ondansetron  **OR** ondansetron  (ZOFRAN ) IV, mouth rinse  Antibiotics: Anti-infectives (From admission, onward)    None       Objective:  Vital Signs  Vitals:   11/04/23 0030 11/04/23 0045 11/04/23 0115 11/04/23 0523  BP: 139/71 (!) 142/75 (!) 152/88 126/75  Pulse: 73 74 72 (!) 58  Resp: 18 (!) 25 16 16   Temp:   97.6 F (36.4 C) 97.8 F (36.6 C)  TempSrc:      SpO2: 93% 93% 96% 96%  Weight:      Height:  Intake/Output Summary (Last 24 hours) at 11/04/2023 1001 Last data filed at 11/04/2023 0610 Gross per 24 hour  Intake 690.67 ml  Output --  Net 690.67 ml   Filed Weights   11/03/23 1627  Weight: 113.4 kg    General appearance: Awake alert.  In no distress Resp: Clear to auscultation bilaterally.  Normal effort Cardio: S1-S2 is normal regular.  No S3-S4.  No rubs murmurs or bruit GI: Abdomen is soft.  Nontender nondistended.  Bowel sounds are present normal.  No masses  organomegaly Extremities: No edema.  Full range of motion of lower extremities. Neurologic: Alert and oriented x3.  No focal neurological deficits.    Lab Results:  Data Reviewed: I have personally reviewed following labs and reports of the imaging studies  CBC: Recent Labs  Lab 11/03/23 1708 11/03/23 2344 11/04/23 0841  WBC 8.5  --   --   HGB 10.4* 9.6* 8.7*  HCT 33.0* 30.0* 27.2*  MCV 92.2  --   --   PLT 225  --   --     Basic Metabolic Panel: Recent Labs  Lab 11/03/23 1708  NA 144  K 3.8  CL 111  CO2 20*  GLUCOSE 110*  BUN 25*  CREATININE 1.26*  CALCIUM  9.3    GFR: Estimated Creatinine Clearance: 68.9 mL/min (A) (by C-G formula based on SCr of 1.26 mg/dL (H)).  Liver Function Tests: Recent Labs  Lab 11/03/23 1708  AST 19  ALT 16  ALKPHOS 72  BILITOT 0.2  PROT 6.5  ALBUMIN 3.7    Coagulation Profile: Recent Labs  Lab 11/03/23 1708  INR 1.3*     Radiology Studies: No results found.     LOS: 0 days   Alvie Speltz [employer] on [email or web address]  11/04/2023, 10:01 AM

## 2023-11-04 NOTE — Care Management Obs Status (Signed)
 MEDICARE OBSERVATION STATUS NOTIFICATION   Patient Details  Name: Quintan Saldivar MRN: [id number] Date of Birth: 03-Oct-1950   Medicare Observation Status Notification Given:  Yes    Sonda Manuella Quill, RN 11/04/2023, 1:36 PM

## 2023-11-04 NOTE — Plan of Care (Signed)

## 2023-11-05 DIAGNOSIS — Z7901 Long term (current) use of anticoagulants: Secondary | ICD-10-CM | POA: Diagnosis not present

## 2023-11-05 DIAGNOSIS — D649 Anemia, unspecified: Secondary | ICD-10-CM | POA: Diagnosis not present

## 2023-11-05 DIAGNOSIS — K921 Melena: Secondary | ICD-10-CM | POA: Diagnosis not present

## 2023-11-05 DIAGNOSIS — K625 Hemorrhage of anus and rectum: Secondary | ICD-10-CM | POA: Diagnosis not present

## 2023-11-05 LAB — CBC
HCT: 29.3 % — ABNORMAL LOW (ref 39.0–52.0)
Hemoglobin: 9.4 g/dL — ABNORMAL LOW (ref 13.0–17.0)
MCH: 29.6 pg (ref 26.0–34.0)
MCHC: 32.1 g/dL (ref 30.0–36.0)
MCV: 92.1 fL (ref 80.0–100.0)
Platelets: 198 K/uL (ref 150–400)
RBC: 3.18 MIL/uL — ABNORMAL LOW (ref 4.22–5.81)
RDW: 15 % (ref 11.5–15.5)
WBC: 7.5 K/uL (ref 4.0–10.5)
nRBC: 0 % (ref 0.0–0.2)

## 2023-11-05 LAB — RETICULOCYTES
Immature Retic Fract: 19.8 % — ABNORMAL HIGH (ref 2.3–15.9)
RBC.: 3.26 MIL/uL — ABNORMAL LOW (ref 4.22–5.81)
Retic Count, Absolute: 54.8 K/uL (ref 19.0–186.0)
Retic Ct Pct: 1.7 % (ref 0.4–3.1)

## 2023-11-05 LAB — BASIC METABOLIC PANEL WITH GFR
Anion gap: 12 (ref 5–15)
BUN: 11 mg/dL (ref 8–23)
CO2: 20 mmol/L — ABNORMAL LOW (ref 22–32)
Calcium: 8.9 mg/dL (ref 8.9–10.3)
Chloride: 109 mmol/L (ref 98–111)
Creatinine, Ser: 0.96 mg/dL (ref 0.61–1.24)
GFR, Estimated: >60 mL/min (ref >60)
Glucose, Bld: 94 mg/dL (ref 70–99)
Potassium: 3.5 mmol/L (ref 3.5–5.1)
Sodium: 140 mmol/L (ref 135–145)

## 2023-11-05 LAB — HEMOGLOBIN AND HEMATOCRIT, BLOOD
HCT: 28.2 % — ABNORMAL LOW (ref 39.0–52.0)
Hemoglobin: 9.1 g/dL — ABNORMAL LOW (ref 13.0–17.0)

## 2023-11-05 LAB — MAGNESIUM: Magnesium: 1.8 mg/dL (ref 1.7–2.4)

## 2023-11-05 LAB — FERRITIN: Ferritin: 17 ng/mL — ABNORMAL LOW (ref 24–336)

## 2023-11-05 LAB — IRON AND TIBC
Iron: 59 ug/dL (ref 45–182)
Saturation Ratios: 19 % (ref 17.9–39.5)
TIBC: 318 ug/dL (ref 250–450)
UIBC: 258 ug/dL

## 2023-11-05 LAB — VITAMIN B12: Vitamin B-12: 878 pg/mL (ref 180–914)

## 2023-11-05 LAB — FOLATE: Folate: >20 ng/mL (ref >5.9)

## 2023-11-05 MED ORDER — POTASSIUM CHLORIDE CRYS ER 20 MEQ PO TBCR
60.0000 meq | EXTENDED_RELEASE_TABLET | Freq: Once | ORAL | Status: AC
Start: 2023-11-05 — End: 2023-11-05
  Administered 2023-11-05: 60 meq via ORAL
  Filled 2023-11-05: qty 3

## 2023-11-05 MED ORDER — MAGNESIUM SULFATE 2 GM/50ML IV SOLN
2.0000 g | Freq: Once | INTRAVENOUS | Status: AC
  Administered 2023-11-05: 2 g via INTRAVENOUS
  Filled 2023-11-05: qty 50

## 2023-11-05 NOTE — Progress Notes (Signed)
 [facility] PROGRESS NOTE   Darl Kuss FMW:[id number] DOB: 17-Jul-1950 DOA: 11/03/2023  PCP: Kip Righter, MD  Brief History: 73 y.o. male with PMH of hypertension, Pre-diabetes with HLD, A-fib on Eliquis , history of SVT/mild AR/left atrial enlargement, anemia, chronic neck pain presented to the ED with complaint of rectal bleeding.  Patient had a colonoscopy recently on September 4 with polypectomy.  Eliquis  was held during that time.    Consultants: [facility]  Procedures: None yet   Subjective/Interval History: Patient showed me a picture of a bowel movement he had this morning which appeared to be a small BM but blood was noted in the toilet bowl.  Denies any abdominal pain nausea vomiting.      Assessment/Plan:  Hematochezia in the setting of recent colonoscopy and polypectomy This is also in the setting of anticoagulation for atrial fibrillation Drop in hemoglobin noted.   He had another episode of bowel movement this morning with some blood in it.  Hemoglobin this morning was 9.4.  Will recheck it later today. Eliquis  remains on hold.  Abdomen is benign on examination. GI continues to follow.    Acute blood loss anemia Drop in hemoglobin is due to blood loss.  Noted to be stable for the most part. Anemia panel was done.  Ferritin is pending.  Iron is 59, TIBC 318, percent saturation 19.  Folic acid  greater than 20.  Vitamin B12 878.    Acute kidney injury Elevated BUN and creatinine noted at the time of admission.  Improved with IV fluids. Will supplement potassium  Persistent atrial fibrillation Anticoagulation currently on hold.  His flecainide  and Toprol  are being continued.  Stable hemodynamically.  Currently in sinus rhythm.  Essential hypertension Holding losartan  and amlodipine.  Remains on metoprolol .  Blood pressure is reasonably well-controlled.  Class I obesity Estimated body mass index is 31.25 kg/m as calculated from the  following:   Height as of this encounter: 6' 3 (1.905 m).   Weight as of this encounter: 113.4 kg.  DVT Prophylaxis: On SCDs.  Anticoagulation on hold Code Status: Full code Family Communication: Discussed with patient.  No family at bedside.  Discussed with granddaughter over the phone Disposition Plan: Home when improved     Medications: Scheduled:  atorvastatin   40 mg Oral Daily   donepezil   5 mg Oral QHS   flecainide   100 mg Oral BID   metoprolol  succinate  25 mg Oral BID   pantoprazole  (PROTONIX ) IV  40 mg Intravenous Q24H   potassium chloride   60 mEq Oral Once   Continuous:  sodium chloride  50 mL/hr at 11/04/23 1024   PRN:acetaminophen  **OR** acetaminophen , ondansetron  **OR** ondansetron  (ZOFRAN ) IV, mouth rinse  Antibiotics: Anti-infectives (From admission, onward)    None       Objective:  Vital Signs  Vitals:   11/04/23 1034 11/04/23 1449 11/04/23 2001 11/05/23 0424  BP: 139/79 132/78 129/78 138/74  Pulse: 62 (!) 59 63 63  Resp: 18 20 18 18   Temp: 98 F (36.7 C) 97.6 F (36.4 C) 97.6 F (36.4 C) 97.8 F (36.6 C)  TempSrc:    Oral  SpO2: 99% 99% 99% 95%  Weight:      Height:        Intake/Output Summary (Last 24 hours) at 11/05/2023 0935 Last data filed at 11/05/2023 0616 Gross per 24 hour  Intake 1593.06 ml  Output --  Net 1593.06 ml   Filed Weights   11/03/23 1627  Weight: 113.4 kg  General appearance: Awake alert.  In no distress Resp: Clear to auscultation bilaterally.  Normal effort Cardio: S1-S2 is normal regular.  No S3-S4.  No rubs murmurs or bruit GI: Abdomen is soft.  Nontender nondistended.  Bowel sounds are present normal.  No masses organomegaly    Lab Results:  Data Reviewed: I have personally reviewed following labs and reports of the imaging studies  CBC: Recent Labs  Lab 11/03/23 1708 11/03/23 2344 11/04/23 0841 11/04/23 1356 11/04/23 2037 11/05/23 0538  WBC 8.5  --  7.5  --   --  7.5  HGB 10.4* 9.6* 8.7*   8.7* 9.0* 8.7* 9.4*  HCT 33.0* 30.0* 28.0*  27.2* 28.5* 27.9* 29.3*  MCV 92.2  --  91.5  --   --  92.1  PLT 225  --  189  --   --  198    Basic Metabolic Panel: Recent Labs  Lab 11/03/23 1708 11/04/23 0841 11/05/23 0538  NA 144 143 140  K 3.8 3.7 3.5  CL 111 112* 109  CO2 20* 21* 20*  GLUCOSE 110* 96 94  BUN 25* 19 11  CREATININE 1.26* 0.93 0.96  CALCIUM  9.3 8.5* 8.9  MG  --   --  1.8    GFR: Estimated Creatinine Clearance: 90.4 mL/min (by C-G formula based on SCr of 0.96 mg/dL).  Liver Function Tests: Recent Labs  Lab 11/03/23 1708  AST 19  ALT 16  ALKPHOS 72  BILITOT 0.2  PROT 6.5  ALBUMIN 3.7    Coagulation Profile: Recent Labs  Lab 11/03/23 1708  INR 1.3*     Radiology Studies: No results found.     LOS: 0 days   Saydi Kobel [employer] on [email or web address]  11/05/2023, 9:35 AM

## 2023-11-05 NOTE — Plan of Care (Signed)

## 2023-11-05 NOTE — Discharge Summary (Signed)
 [facility]  Physician Discharge Summary   Patient ID: Andrew Perkins MRN: [id number] DOB/AGE: 1950/11/18 73 y.o.  Admit date: 11/03/2023 Discharge date: 11/05/2023    PCP: Kip Righter, MD  DISCHARGE DIAGNOSES:    Rectal bleed   Atrial fibrillation (HCC)   HTN (hypertension)   Chronic neck pain   ABLA (acute blood loss anemia)   RECOMMENDATIONS FOR OUTPATIENT FOLLOW UP: Patient instructed to hold Eliquis  for 10 days GI will schedule outpatient follow-up   Home Health: None Equipment/Devices: None  CODE STATUS: Full code  DISCHARGE CONDITION: fair  Diet recommendation: As before  INITIAL HISTORY: 73 y.o. male with PMH of hypertension, Pre-diabetes with HLD, A-fib on Eliquis , history of SVT/mild AR/left atrial enlargement, anemia, chronic neck pain presented to the ED with complaint of rectal bleeding.  Patient had a colonoscopy recently on September 4 with polypectomy.  Eliquis  was held during that time.     Consultants: [facility]   Procedures: None  HOSPITAL COURSE:   Hematochezia in the setting of recent colonoscopy and polypectomy This is also in the setting of anticoagulation for atrial fibrillation Drop in hemoglobin noted.   Hemoglobin however remained stable.  Had a small bowel movement earlier this morning with some blood in it which is likely old blood. GI has seen the patient.  They have cleared him for discharge.  They recommend holding Eliquis  for 10 days.   Acute blood loss anemia Drop in hemoglobin is due to blood loss.  Noted to be stable.   Anemia panel was done.  Ferritin is pending.  Iron is 59, TIBC 318, percent saturation 19.  Folic acid  greater than 20.  Vitamin B12 878.     Acute kidney injury Elevated BUN and creatinine noted at the time of admission.  Improved with IV fluids. Potassium was supplemented   Persistent atrial fibrillation Anticoagulation currently on hold.  His flecainide  and Toprol  are being continued.   Stable hemodynamically.  Currently in sinus rhythm.   Essential hypertension Holding losartan  and amlodipine.  Remains on metoprolol .  Blood pressure is reasonably well-controlled.   Class I obesity Estimated body mass index is 31.25 kg/m as calculated from the following:   Height as of this encounter: 6' 3 (1.905 m).   Weight as of this encounter: 113.4 kg.  Swelling of the right forearm IV infiltrated into his right forearm overnight.  No vasoactive infusions were given.  Good radial pulses.  He was asked to apply warm compresses and keep the arm elevated.  He was told to seek attention if he notices any redness over the skin or blister formation or breakdown of the skin.  Patient is stable.  Okay for discharge home today.   PERTINENT LABS:  The results of significant diagnostics from this hospitalization (including imaging, microbiology, ancillary and laboratory) are listed below for reference.    Labs:   Basic Metabolic Panel: Recent Labs  Lab 11/03/23 1708 11/04/23 0841 11/05/23 0538  NA 144 143 140  K 3.8 3.7 3.5  CL 111 112* 109  CO2 20* 21* 20*  GLUCOSE 110* 96 94  BUN 25* 19 11  CREATININE 1.26* 0.93 0.96  CALCIUM  9.3 8.5* 8.9  MG  --   --  1.8   Liver Function Tests: Recent Labs  Lab 11/03/23 1708  AST 19  ALT 16  ALKPHOS 72  BILITOT 0.2  PROT 6.5  ALBUMIN 3.7    CBC: Recent Labs  Lab 11/03/23 1708 11/03/23 2344 11/04/23 0841 11/04/23 1356  11/04/23 2037 11/05/23 0538 11/05/23 1201  WBC 8.5  --  7.5  --   --  7.5  --   HGB 10.4*   < > 8.7*  8.7* 9.0* 8.7* 9.4* 9.1*  HCT 33.0*   < > 28.0*  27.2* 28.5* 27.9* 29.3* 28.2*  MCV 92.2  --  91.5  --   --  92.1  --   PLT 225  --  189  --   --  198  --    < > = values in this interval not displayed.     IMAGING STUDIES No results found.  DISCHARGE EXAMINATION: See progress note from earlier today  DISPOSITION: Home  Discharge Instructions     Call MD for:  difficulty breathing,  headache or visual disturbances   Complete by: As directed    Call MD for:  extreme fatigue   Complete by: As directed    Call MD for:  persistant dizziness or light-headedness   Complete by: As directed    Call MD for:  persistant nausea and vomiting   Complete by: As directed    Call MD for:  severe uncontrolled pain   Complete by: As directed    Call MD for:  temperature >100.4   Complete by: As directed    Diet - low sodium heart healthy   Complete by: As directed    Discharge instructions   Complete by: As directed    Please monitor for worsening bleeding. Please have your PCP check your hemoglobin in 1 week. Hold Eliquis  for 10 days.  You were cared for by a hospitalist during your hospital stay. If you have any questions about your discharge medications or the care you received while you were in the hospital after you are discharged, you can call the unit and asked to speak with the hospitalist on call if the hospitalist that took care of you is not available. Once you are discharged, your primary care physician will handle any further medical issues. Please note that NO REFILLS for any discharge medications will be authorized once you are discharged, as it is imperative that you return to your primary care physician (or establish a relationship with a primary care physician if you do not have one) for your aftercare needs so that they can reassess your need for medications and monitor your lab values. If you do not have a primary care physician, you can call [phone] for a physician referral.   Increase activity slowly   Complete by: As directed          Allergies as of 11/05/2023       Reactions   Penicillins Other (See Comments)   syncope        Medication List     PAUSE taking these medications    apixaban  5 MG Tabs tablet Wait to take this until: November 15, 2023 Commonly known as: Eliquis  Take 1 tablet (5 mg total) by mouth 2 (two) times daily.        STOP taking these medications    donepezil  5 MG tablet Commonly known as: ARICEPT        TAKE these medications    amLODipine 5 MG tablet Commonly known as: NORVASC Take 5 mg by mouth daily.   atorvastatin  40 MG tablet Commonly known as: LIPITOR Take 1 tablet (40 mg total) by mouth daily.   diltiazem  30 MG tablet Commonly known as: Cardizem  Take 1 tablet every 4 hours AS NEEDED for heart rate >  100   flecainide  100 MG tablet Commonly known as: TAMBOCOR  Take 1 tablet (100 mg total) by mouth 2 (two) times daily.   losartan  100 MG tablet Commonly known as: COZAAR  Take 100 mg by mouth daily.   metoprolol  succinate 25 MG 24 hr tablet Commonly known as: TOPROL -XL Take 1 tablet (25 mg total) by mouth 2 (two) times daily.   Multi For Him 50+ Tabs Take 1 tablet by mouth every morning.   omeprazole 20 MG tablet Commonly known as: PRILOSEC OTC Take 20 mg by mouth daily as needed.   psyllium 95 % Pack Commonly known as: HYDROCIL/METAMUCIL Take 1 packet by mouth daily as needed.   VITAMIN C PO Take 1 tablet by mouth daily.   zinc gluconate 50 MG tablet Take 50 mg by mouth daily.           TOTAL DISCHARGE TIME: 35 minutes  Jericca Russett [employer] on [email or web address]  11/06/2023, 11:20 AM

## 2023-11-05 NOTE — Progress Notes (Signed)
 Subjective: One bowel movement yesterday, small amount blood. No abdominal pain. Tolerated clear liquids.  Objective: Vital signs in last 24 hours: Temp:  [97.6 F (36.4 C)-97.8 F (36.6 C)] 97.8 F (36.6 C) (09/14 0424) Pulse Rate:  [59-63] 63 (09/14 0424) Resp:  [18-20] 18 (09/14 0424) BP: (129-138)/(74-78) 138/74 (09/14 0424) SpO2:  [95 %-99 %] 95 % (09/14 0424) Weight change:  Last BM Date : 11/03/23  PE: GEN:  NAD ABD:  Soft, non-tender NEURO:  No encephalopathy  Lab Results: CBC    Component Value Date/Time   WBC 7.5 11/05/2023 0538   RBC 3.18 (L) 11/05/2023 0538   RBC 3.26 (L) 11/05/2023 0538   HGB 9.4 (L) 11/05/2023 0538   HGB 13.0 11/19/2020 1019   HCT 29.3 (L) 11/05/2023 0538   HCT 38.8 11/19/2020 1019   PLT 198 11/05/2023 0538   PLT 239 11/19/2020 1019   MCV 92.1 11/05/2023 0538   MCV 86 11/19/2020 1019   MCH 29.6 11/05/2023 0538   MCHC 32.1 11/05/2023 0538   RDW 15.0 11/05/2023 0538   RDW 14.6 11/19/2020 1019   LYMPHSABS 2.5 03/11/2019 1817   MONOABS 0.8 03/11/2019 1817   EOSABS 0.5 03/11/2019 1817   BASOSABS 0.0 03/11/2019 1817  CMP     Component Value Date/Time   NA 140 11/05/2023 0538   NA 141 12/16/2020 1006   K 3.5 11/05/2023 0538   CL 109 11/05/2023 0538   CO2 20 (L) 11/05/2023 0538   GLUCOSE 94 11/05/2023 0538   BUN 11 11/05/2023 0538   BUN 17 12/16/2020 1006   CREATININE 0.96 11/05/2023 0538   CALCIUM  8.9 11/05/2023 0538   PROT 6.5 11/03/2023 1708   PROT 7.0 11/19/2020 1019   ALBUMIN 3.7 11/03/2023 1708   ALBUMIN 4.3 11/19/2020 1019   AST 19 11/03/2023 1708   ALT 16 11/03/2023 1708   ALKPHOS 72 11/03/2023 1708   BILITOT 0.2 11/03/2023 1708   BILITOT 0.5 11/19/2020 1019   GFR 100.82 03/27/2014 1211   EGFR 73 12/16/2020 1006   GFRNONAA >60 11/05/2023 0538    Assessment:   Post-polypectomy bleeding.  Much improved. Acute blood loss anemia, Hgb stable. Chronic anticoagulation, on hold.  Plan:   Hold Eliquis  10 days total  starting from date of admission (9/12). Follow CBC, transfuse as needed. Advance diet. Suspect discharge home later today or tomorrow. [facility] will follow while patient remains in hospital, and then we can arrange outpatient follow-up with Dr. Elicia, patient's gastroenterologist. Case reviewed with Dr. Verdene.   Andrew Perkins 11/05/2023, 11:39 AM   Cell [phone] If no answer or after 5 PM call [phone]

## 2023-11-09 ENCOUNTER — Other Ambulatory Visit (HOSPITAL_COMMUNITY): Payer: Self-pay | Admitting: *Deleted

## 2023-11-09 DIAGNOSIS — K922 Gastrointestinal hemorrhage, unspecified: Secondary | ICD-10-CM | POA: Diagnosis not present

## 2023-11-09 MED ORDER — FLECAINIDE ACETATE 100 MG PO TABS: 100.0000 mg | ORAL_TABLET | Freq: Two times a day (BID) | ORAL | 2 refills | Status: AC

## 2023-11-10 DIAGNOSIS — I10 Essential (primary) hypertension: Secondary | ICD-10-CM | POA: Diagnosis not present

## 2023-11-10 DIAGNOSIS — Z09 Encounter for follow-up examination after completed treatment for conditions other than malignant neoplasm: Secondary | ICD-10-CM | POA: Diagnosis not present

## 2023-11-10 DIAGNOSIS — Z23 Encounter for immunization: Secondary | ICD-10-CM | POA: Diagnosis not present

## 2023-11-10 DIAGNOSIS — I4819 Other persistent atrial fibrillation: Secondary | ICD-10-CM | POA: Diagnosis not present

## 2023-11-16 ENCOUNTER — Encounter (HOSPITAL_COMMUNITY): Payer: Self-pay | Admitting: Internal Medicine

## 2023-11-16 ENCOUNTER — Ambulatory Visit (HOSPITAL_COMMUNITY)
Admission: RE | Admit: 2023-11-16 | Discharge: 2023-11-16 | Disposition: A | Discharge status: Home / Self Care | Source: Ambulatory Visit | Attending: Internal Medicine | Admitting: Internal Medicine

## 2023-11-16 VITALS — BP 124/72 | HR 65 | Ht 75.0 in | Wt 252.2 lb

## 2023-11-16 DIAGNOSIS — D6869 Other thrombophilia: Secondary | ICD-10-CM

## 2023-11-16 DIAGNOSIS — I480 Paroxysmal atrial fibrillation: Secondary | ICD-10-CM | POA: Diagnosis not present

## 2023-11-16 DIAGNOSIS — Z79899 Other long term (current) drug therapy: Secondary | ICD-10-CM | POA: Diagnosis not present

## 2023-11-16 DIAGNOSIS — Z5181 Encounter for therapeutic drug level monitoring: Secondary | ICD-10-CM | POA: Diagnosis not present

## 2023-11-16 NOTE — Progress Notes (Signed)
 Patient ID: Andrew Perkins, male   DOB: 26-Jun-1950, 73 y.o.   MRN: [id number]     Patient ID: Andrew Perkins, male   DOB: 04/05/1950, 73 y.o.   MRN: [id number]   Date:  11/16/2023   PCP:  Kip Righter, MD  Cardiologist:  Dr Delford Primary Electrophysiologist: Dr. Cindie     f/u flecainide   use   History of Present Illness: Andrew Perkins is a 73 y.o. male who presents today for f/u in the afib clinic.   The patient reports initially being diagnosed with atrial fibrillation 2/15 after presenting with tachypalpitations.  In retrospect, he thinks that he has had afib for 4-5 years.  Episodes were initially short, lasting several minutes.  At that time, he attributed them to anxiety.  He developed sustained tachycardia 2/15 and he presented to Dr Lanney office with documented afib on ekg.  He was placed on eliquis  and metoprolol .  He reports increasing frequency and duration of atrial fibrillation.  Several months ago, he was given flecainide  as a pill in pocket approach.  He has required flecainide  several times since then.  He had an event monitor placed 2/16 which has documented a more regular narrow complex SVT.  He has not tried daily AAD therapy though  He does take metoprolol  twice daily.  He has very frequent palpitations which he finds worrisome.  He saw Dr. Kelsie in consultation 4/16 and was given options to treat afib. He wanted to pursue AAD and avoid ablation.  He was started on flecainide  100 mg bid . He appears to be tolerating without adverse effects and most importantly has very little afib burden on drug. He noticed afib for one hour last Sunday and this is the most afib that he has noticed.  He reports  negative sleep study. He does try to walk daily. Remains on DOAC with chadsvasc score of 2. He states that he does have mild anemia and takes iron. He has had a colonoscopy in the last year and did have polys which were removed.  F/u clinic 07/1515, continue on flecainide  and is doing  will with litle afib burden. He report that he had 3 episode in early April, longest epiosde was 3 hours at v rate of 102 bpm, next episode 3 days later at 115 bpm, lasted one hour, last epiosde lasted 5 mins, at 99 bpm. Has not any further episodes since that 8th of April. He is still satisfied with afib management. He can now continue with what he is doing in afib vrs getting lightheaded and having to lay down.  Continues on eliquis , no bleeding issues.  F/u in afib clinic 12/08/15 and reports small afib burden. He is till happy with afib management. He is walking on a regular basis. Afib episodes can last 5 mins to an hour. No bleeding issues with eliquis .  F/u in afib clinc 04/07/16. He reports increase in afib burden over the last week because he traveled out of town and was out of his usual routine with diet and sleeping. He still is happy with flecainide  and wants to continue with drug.   Return to afib clinic 7/18. Continues with flecainide  and reports very little afib burden. Feels well. Continues with eliquis  5 mg bid, no bleeding issues currently.  F/u 10/25/17,he reports very little afib burden Is happy with current management with flecainide .. No bleeding issues. Had labs drawn with PCP yesterday.  F/u afib clinic 11/15/18. He is still happy with his afib burden on flecainide .  He has 1-2 outbreaks a month, lasting around one hour. He does not have to stop what he is doing when he is in afib. He usually knows only  because of higher heart rates on his watch.   F/u in afib clinic, 11/18/19. He in Sinus brady at 57 bpm, continues on flecainide  and BB. He guesstimates around 10 afib episodes over the last year. Most episodes are short, 10-15 mins and his longest episode was around 6 hours. He  took an extra flecainide  and BB and he converted. Continues  on eliquis  5 mg bid for a CHA2DS2VASc score of 2.   F/u in afib clinic, 09/01/20. He reports that he was visiting his daughter in [place]  middle of  June, he had chills with elevated heart rate. He went to the ER there and tested COVID positive. He was in sinus tachycardia, EKG results reviewed in Epic. [name] He had elevated HR and chills for another 24 hours and then his symptoms became minimal. He has noted some short elevations of heart rate since then. Apple strips reviewed and appear to show sinus tach. He will occassionally will take a prn metoprolol .   F/u in the afib clinic, 09/08/21. He reports since July 5th he has had 3 afib episodes usually lasting around 4-6 hours. He will  take an extra metoprolol . No specific trigger found. No alcohol use. He is in SR today.  F/u, 10/19/21, from last visit for increased afib burden. For the last month, he has been in rhythm without any afib. He is happy to stay with flecainide  for now, does not see a reason to change.   F/u in afib clinic, 04/13/22. He reports very rare elevated heart beat episode that is not sustained. He saw Dr. Cindie  in December 2023 and a monitor that Dr. Delford placed was reviewed and it was noted some episodes of AT and PAC's. He changed his BB to succinate  and continued with flecainide .   F/u in Afib clinic, 05/18/23. He is here for flecainide  surveillance. He is currently in NSR. He takes flecainide  100 mg BID and Toprol  25 mg daily. No Afib confirmed with [employer]. No missed doses of Eliquis  5 mg BID.   Follow up in Afib clinic, 11/16/23. Patient is here for flecainide  surveillance. Hospital admission for GI bleeding 9/12-14 likely related to colonoscopy/polypectomy on 9/4. Eliquis  held for 10 days and he restarted yesterday. No bleeding issues since hospital admission.  Today, he denies symptoms of chest pain, shortness of breath, orthopnea, PND, lower extremity edema, claudication, dizziness, presyncope, syncope, bleeding, or neurologic sequela. The patient is tolerating medications without difficulties and is otherwise without complaint today.    Past Medical History:   Diagnosis Date   Anemia    a. Mild per pt - previously on iron but did not tolerate.   Chronic neck pain 06/27/2017   Diabetes mellitus without complication (HCC)    borderline   Erectile dysfunction    Former consumption of alcohol    a. Abstinent as of 03/2013.   Hyperlipidemia    Hypertension    Left atrial enlargement    Mild aortic insufficiency    PAF (paroxysmal atrial fibrillation) (HCC)    SVT (supraventricular tachycardia)    Vitamin D deficiency    Past Surgical History:  Procedure Laterality Date   CIRCUMCISION     at 73yrs old   TONSILLECTOMY     WISDOM TOOTH EXTRACTION       Current Outpatient Medications  Medication  Sig Dispense Refill   amLODipine (NORVASC) 5 MG tablet Take 5 mg by mouth daily.     apixaban  (ELIQUIS ) 5 MG TABS tablet Take 1 tablet (5 mg total) by mouth 2 (two) times daily. 180 tablet 1   Ascorbic Acid (VITAMIN C PO) Take 1 tablet by mouth daily.     atorvastatin  (LIPITOR) 40 MG tablet Take 1 tablet (40 mg total) by mouth daily. 90 tablet 3   diltiazem  (CARDIZEM ) 30 MG tablet Take 1 tablet every 4 hours AS NEEDED for heart rate >100 30 tablet 1   flecainide  (TAMBOCOR ) 100 MG tablet Take 1 tablet (100 mg total) by mouth 2 (two) times daily. 180 tablet 2   losartan  (COZAAR ) 100 MG tablet Take 100 mg by mouth daily.     metoprolol  succinate (TOPROL -XL) 25 MG 24 hr tablet Take 1 tablet (25 mg total) by mouth 2 (two) times daily. 180 tablet 2   Multiple Vitamins-Minerals (MULTI FOR HIM 50+) TABS Take 1 tablet by mouth every morning.     omeprazole (PRILOSEC OTC) 20 MG tablet Take 20 mg by mouth daily as needed.     psyllium (HYDROCIL/METAMUCIL) 95 % PACK Take 1 packet by mouth daily as needed.     zinc gluconate 50 MG tablet Take 50 mg by mouth daily.     No current facility-administered medications for this encounter.    Allergies:   Penicillins   ROS:  Please see the history of present illness.   All other systems are reviewed and negative.     PHYSICAL EXAM: VS:  BP 124/72   Pulse 65   Ht 6' 3 (1.905 m)   Wt 114.4 kg   BMI 31.52 kg/m  , BMI Body mass index is 31.52 kg/m.  GEN- The patient is well appearing, alert and oriented x 3 today.   Neck - no JVD or carotid bruit noted Lungs- Clear to ausculation bilaterally, normal work of breathing Heart- Regular rate and rhythm, no murmurs, rubs or gallops, PMI not laterally displaced Extremities- no clubbing, cyanosis, or edema Skin - no rash or ecchymosis noted   EKG:   Vent. rate 65 BPM PR interval 184 ms QRS duration 102 ms QT/QTcB 426/443 ms P-R-T axes 12 -15 34 Normal sinus rhythm Minimal voltage criteria for LVH, may be normal variant ( R in aVL ) Borderline ECG When compared with ECG of 18-May-2023 10:43, T wave inversion no longer evident in Inferior leads  ECHO 12/29/21:  1. Left ventricular ejection fraction, by estimation, is 60 to 65%. The  left ventricle has normal function. The left ventricle has no regional  wall motion abnormalities. There is moderate concentric left ventricular  hypertrophy. Left ventricular  diastolic parameters are consistent with Grade I diastolic dysfunction  (impaired relaxation).   2. Right ventricular systolic function is normal. The right ventricular  size is normal. There is normal pulmonary artery systolic pressure.   3. The mitral valve is normal in structure. Mild mitral valve  regurgitation. No evidence of mitral stenosis.   4. The aortic valve is tricuspid. Aortic valve regurgitation is mild. No  aortic stenosis is present.   5. Aortic dilatation noted. There is mild dilatation of the aortic root,  measuring 40 mm.   6. The inferior vena cava is normal in size with greater than 50%  respiratory variability, suggesting right atrial pressure of 3 mmHg.     Wt Readings from Last 3 Encounters:  11/16/23 114.4 kg  11/03/23 113.4 kg  05/18/23 117.5 kg     CHA2DS2-VASc Score = 3  The patient's score is based  upon: CHF History: 0 HTN History: 1 Diabetes History: 0 Stroke History: 0 Vascular Disease History: 0 Age Score: 2 Gender Score: 0       ASSESSMENT AND PLAN:  Paroxysmal atrial fibrillation/ SVT/AT/PAC's  Patient is currently in NSR. Continue Toprol  25 mg BID.  High risk medication monitoring (ICD10: J342684) Patient requires ongoing monitoring for anti-arrhythmic medication which has the potential to cause life threatening arrhythmias or AV block. ECG intervals are stable. Continue flecainide  100 mg BID.   Secondary hypercoagulable state due to Afib Continue Eliquis .   Hypertension Stable today.     Follow up 6 months for flecainide  surveillance.   Dorn Heinrich, PA-C [facility] [place] [address] [place], [place] [address] [phone]

## 2023-11-18 DIAGNOSIS — I4891 Unspecified atrial fibrillation: Secondary | ICD-10-CM | POA: Diagnosis not present

## 2023-11-18 DIAGNOSIS — I10 Essential (primary) hypertension: Secondary | ICD-10-CM | POA: Diagnosis not present

## 2023-11-21 DIAGNOSIS — E669 Obesity, unspecified: Secondary | ICD-10-CM | POA: Diagnosis not present

## 2023-11-21 DIAGNOSIS — E785 Hyperlipidemia, unspecified: Secondary | ICD-10-CM | POA: Diagnosis not present

## 2023-11-21 DIAGNOSIS — I4891 Unspecified atrial fibrillation: Secondary | ICD-10-CM | POA: Diagnosis not present

## 2023-11-21 DIAGNOSIS — E66811 Obesity, class 1: Secondary | ICD-10-CM | POA: Diagnosis not present

## 2023-11-21 DIAGNOSIS — I10 Essential (primary) hypertension: Secondary | ICD-10-CM | POA: Diagnosis not present

## 2023-11-27 DIAGNOSIS — D500 Iron deficiency anemia secondary to blood loss (chronic): Secondary | ICD-10-CM | POA: Diagnosis not present

## 2023-11-27 DIAGNOSIS — Z8679 Personal history of other diseases of the circulatory system: Secondary | ICD-10-CM | POA: Diagnosis not present

## 2023-11-27 DIAGNOSIS — N289 Disorder of kidney and ureter, unspecified: Secondary | ICD-10-CM | POA: Diagnosis not present

## 2023-11-27 DIAGNOSIS — D369 Benign neoplasm, unspecified site: Secondary | ICD-10-CM | POA: Diagnosis not present

## 2023-11-27 DIAGNOSIS — D649 Anemia, unspecified: Secondary | ICD-10-CM | POA: Diagnosis not present

## 2023-12-14 DIAGNOSIS — I10 Essential (primary) hypertension: Secondary | ICD-10-CM | POA: Diagnosis not present

## 2023-12-14 DIAGNOSIS — E66811 Obesity, class 1: Secondary | ICD-10-CM | POA: Diagnosis not present

## 2023-12-14 DIAGNOSIS — E785 Hyperlipidemia, unspecified: Secondary | ICD-10-CM | POA: Diagnosis not present

## 2023-12-14 DIAGNOSIS — Z6831 Body mass index (BMI) 31.0-31.9, adult: Secondary | ICD-10-CM | POA: Diagnosis not present

## 2023-12-14 DIAGNOSIS — R7303 Prediabetes: Secondary | ICD-10-CM | POA: Diagnosis not present

## 2023-12-14 DIAGNOSIS — E559 Vitamin D deficiency, unspecified: Secondary | ICD-10-CM | POA: Diagnosis not present

## 2023-12-19 DIAGNOSIS — I10 Essential (primary) hypertension: Secondary | ICD-10-CM | POA: Diagnosis not present

## 2023-12-19 DIAGNOSIS — I4891 Unspecified atrial fibrillation: Secondary | ICD-10-CM | POA: Diagnosis not present

## 2023-12-22 DIAGNOSIS — E669 Obesity, unspecified: Secondary | ICD-10-CM | POA: Diagnosis not present

## 2023-12-22 DIAGNOSIS — I10 Essential (primary) hypertension: Secondary | ICD-10-CM | POA: Diagnosis not present

## 2023-12-22 DIAGNOSIS — I4891 Unspecified atrial fibrillation: Secondary | ICD-10-CM | POA: Diagnosis not present

## 2023-12-22 DIAGNOSIS — E66811 Obesity, class 1: Secondary | ICD-10-CM | POA: Diagnosis not present

## 2023-12-22 DIAGNOSIS — E785 Hyperlipidemia, unspecified: Secondary | ICD-10-CM | POA: Diagnosis not present

## 2024-01-18 DIAGNOSIS — I10 Essential (primary) hypertension: Secondary | ICD-10-CM | POA: Diagnosis not present

## 2024-01-18 DIAGNOSIS — I4891 Unspecified atrial fibrillation: Secondary | ICD-10-CM | POA: Diagnosis not present

## 2024-01-21 DIAGNOSIS — E785 Hyperlipidemia, unspecified: Secondary | ICD-10-CM | POA: Diagnosis not present

## 2024-01-21 DIAGNOSIS — I10 Essential (primary) hypertension: Secondary | ICD-10-CM | POA: Diagnosis not present

## 2024-01-21 DIAGNOSIS — E669 Obesity, unspecified: Secondary | ICD-10-CM | POA: Diagnosis not present

## 2024-01-21 DIAGNOSIS — E66811 Obesity, class 1: Secondary | ICD-10-CM | POA: Diagnosis not present

## 2024-01-21 DIAGNOSIS — I4891 Unspecified atrial fibrillation: Secondary | ICD-10-CM | POA: Diagnosis not present

## 2024-03-11 ENCOUNTER — Other Ambulatory Visit: Payer: Self-pay

## 2024-03-11 DIAGNOSIS — I480 Paroxysmal atrial fibrillation: Secondary | ICD-10-CM

## 2024-03-12 MED ORDER — APIXABAN 5 MG PO TABS: 5.0000 mg | ORAL_TABLET | Freq: Two times a day (BID) | ORAL | 1 refills | Status: DC

## 2024-03-13 ENCOUNTER — Other Ambulatory Visit (HOSPITAL_COMMUNITY): Payer: Self-pay | Admitting: *Deleted

## 2024-03-13 DIAGNOSIS — I480 Paroxysmal atrial fibrillation: Secondary | ICD-10-CM

## 2024-03-13 MED ORDER — APIXABAN 5 MG PO TABS: 5.0000 mg | ORAL_TABLET | Freq: Two times a day (BID) | ORAL | 1 refills | Status: AC

## 2024-04-09 ENCOUNTER — Ambulatory Visit: Payer: PPO | Admitting: Neurology

## 2024-05-16 ENCOUNTER — Ambulatory Visit (HOSPITAL_COMMUNITY): Admitting: Internal Medicine
